# Patient Record
Sex: Female | Born: 1945 | Smoking: Never smoker
Health system: Southern US, Community
[De-identification: ages and names within clinical notes are randomized; demographics above are authoritative.]

## PROBLEM LIST (undated history)

## (undated) DIAGNOSIS — F329 Major depressive disorder, single episode, unspecified: Secondary | ICD-10-CM

## (undated) DIAGNOSIS — F419 Anxiety disorder, unspecified: Secondary | ICD-10-CM

## (undated) DIAGNOSIS — F32A Depression, unspecified: Secondary | ICD-10-CM

## (undated) DIAGNOSIS — I4891 Unspecified atrial fibrillation: Secondary | ICD-10-CM

## (undated) DIAGNOSIS — I10 Essential (primary) hypertension: Secondary | ICD-10-CM

## (undated) DIAGNOSIS — F0390 Unspecified dementia without behavioral disturbance: Secondary | ICD-10-CM

---

## 2015-06-06 ENCOUNTER — Emergency Department (HOSPITAL_COMMUNITY): Payer: Medicare Other

## 2015-06-06 ENCOUNTER — Emergency Department (HOSPITAL_COMMUNITY)
Admission: EM | Admit: 2015-06-06 | Discharge: 2015-06-06 | Disposition: A | Discharge status: Home / Self Care | Payer: Medicare Other | Attending: Emergency Medicine | Admitting: Emergency Medicine

## 2015-06-06 ENCOUNTER — Encounter (HOSPITAL_COMMUNITY): Payer: Self-pay | Admitting: Emergency Medicine

## 2015-06-06 DIAGNOSIS — Z880 Allergy status to penicillin: Secondary | ICD-10-CM | POA: Diagnosis not present

## 2015-06-06 DIAGNOSIS — F0390 Unspecified dementia without behavioral disturbance: Secondary | ICD-10-CM | POA: Insufficient documentation

## 2015-06-06 DIAGNOSIS — Z79899 Other long term (current) drug therapy: Secondary | ICD-10-CM | POA: Diagnosis not present

## 2015-06-06 DIAGNOSIS — F419 Anxiety disorder, unspecified: Secondary | ICD-10-CM | POA: Diagnosis not present

## 2015-06-06 DIAGNOSIS — R4182 Altered mental status, unspecified: Secondary | ICD-10-CM | POA: Diagnosis present

## 2015-06-06 DIAGNOSIS — Z7902 Long term (current) use of antithrombotics/antiplatelets: Secondary | ICD-10-CM | POA: Insufficient documentation

## 2015-06-06 HISTORY — DX: Anxiety disorder, unspecified: F41.9

## 2015-06-06 LAB — COMPREHENSIVE METABOLIC PANEL
ALBUMIN: 3 g/dL — AB (ref 3.5–5.0)
ALT: 18 U/L (ref 14–54)
ANION GAP: 5 (ref 5–15)
AST: 23 U/L (ref 15–41)
Alkaline Phosphatase: 64 U/L (ref 38–126)
BILIRUBIN TOTAL: 1.1 mg/dL (ref 0.3–1.2)
BUN: 10 mg/dL (ref 6–20)
CO2: 29 mmol/L (ref 22–32)
Calcium: 8.7 mg/dL — ABNORMAL LOW (ref 8.9–10.3)
Chloride: 109 mmol/L (ref 101–111)
Creatinine, Ser: 0.69 mg/dL (ref 0.44–1.00)
GFR calc Af Amer: >60 mL/min (ref >60)
Glucose, Bld: 87 mg/dL (ref 65–99)
POTASSIUM: 3.4 mmol/L — AB (ref 3.5–5.1)
Sodium: 143 mmol/L (ref 135–145)
TOTAL PROTEIN: 5.7 g/dL — AB (ref 6.5–8.1)

## 2015-06-06 LAB — URINALYSIS, ROUTINE W REFLEX MICROSCOPIC
BILIRUBIN URINE: NEGATIVE
Glucose, UA: NEGATIVE mg/dL
HGB URINE DIPSTICK: NEGATIVE
Ketones, ur: NEGATIVE mg/dL
Nitrite: NEGATIVE
PROTEIN: NEGATIVE mg/dL
Specific Gravity, Urine: 1.011 (ref 1.005–1.030)
UROBILINOGEN UA: 1 mg/dL (ref 0.0–1.0)
pH: 7 (ref 5.0–8.0)

## 2015-06-06 LAB — CBC WITH DIFFERENTIAL/PLATELET
BASOS PCT: 1 %
Basophils Absolute: 0 10*3/uL (ref 0.0–0.1)
Eosinophils Absolute: 0.2 10*3/uL (ref 0.0–0.7)
Eosinophils Relative: 3 %
HEMATOCRIT: 38.8 % (ref 36.0–46.0)
Hemoglobin: 12.8 g/dL (ref 12.0–15.0)
Lymphocytes Relative: 38 %
Lymphs Abs: 2.1 10*3/uL (ref 0.7–4.0)
MCH: 32.7 pg (ref 26.0–34.0)
MCHC: 33 g/dL (ref 30.0–36.0)
MCV: 99 fL (ref 78.0–100.0)
MONO ABS: 0.8 10*3/uL (ref 0.1–1.0)
MONOS PCT: 15 %
Neutro Abs: 2.4 10*3/uL (ref 1.7–7.7)
Neutrophils Relative %: 43 %
Platelets: 167 10*3/uL (ref 150–400)
RBC: 3.92 MIL/uL (ref 3.87–5.11)
RDW: 13.3 % (ref 11.5–15.5)
WBC: 5.5 10*3/uL (ref 4.0–10.5)

## 2015-06-06 LAB — PROTIME-INR
INR: 1.61 — ABNORMAL HIGH (ref 0.00–1.49)
PROTHROMBIN TIME: 19.1 s — AB (ref 11.6–15.2)

## 2015-06-06 LAB — URINE MICROSCOPIC-ADD ON

## 2015-06-06 LAB — I-STAT TROPONIN, ED: TROPONIN I, POC: 0.01 ng/mL (ref 0.00–0.08)

## 2015-06-06 NOTE — ED Notes (Signed)
PT BIB [facility]; Per EMS, pt coming to ED due to "not being happy today"; this is a reported status change from pt's usual pleasant demeanor; pt has dementia; pt calm and cooperative with NAD noted.

## 2015-06-06 NOTE — Discharge Instructions (Signed)

## 2015-06-06 NOTE — ED Notes (Signed)
Patient transported to CT 

## 2015-06-06 NOTE — ED Notes (Signed)
MD at bedside. 

## 2015-06-06 NOTE — ED Notes (Signed)
Pt returned from CT °

## 2015-06-08 NOTE — ED Provider Notes (Signed)
CSN: [id number]     Arrival date & time 06/06/15  1049 History   First MD Initiated Contact with Patient 06/06/15 1106     Chief Complaint  Patient presents with  . Altered Mental Status     (Consider location/radiation/quality/duration/timing/severity/associated sxs/prior Treatment) HPI Comments: 69 y.o. Female with history of dementia presents from her nursing facility for change in mental status.  Per report the patient was sent because she was uncooperative and unpleasant which is unusual for her so they were afraid that there was something wrong with her medically.  Patient was calm, happy, and cooperative at time of my assessment.  Not able to tell me why she is here but does answer questions.  Patient is a 69 y.o. female presenting with altered mental status.  Altered Mental Status   Past Medical History  Diagnosis Date  . Anxiety    History reviewed. No pertinent past surgical history. No family history on file. Social History  Substance Use Topics  . Smoking status: Unknown If Ever Smoked  . Smokeless tobacco: None  . Alcohol Use: None   OB History    No data available     Patient reports yes to any ROS questions asked but is oriented to person only and has documented dementia.  Review of Systems  Unable to perform ROS: Dementia      Allergies  Bacitracin; Other; Penicillins; and Statins  Home Medications   Prior to Admission medications   Medication Sig Start Date End Date Taking? Authorizing Provider  albuterol (VENTOLIN HFA) 108 (90 BASE) MCG/ACT inhaler Inhale 2 puffs into the lungs every 4 (four) hours as needed for wheezing.   Yes Historical Provider, MD  apixaban (ELIQUIS) 5 MG TABS tablet Take 5 mg by mouth 2 (two) times daily.   Yes Historical Provider, MD  divalproex (DEPAKOTE) 500 MG DR tablet Take 500 mg by mouth 2 (two) times daily.   Yes Historical Provider, MD  furosemide (LASIX) 40 MG tablet Take 20 mg by mouth daily.   Yes Historical  Provider, MD  LORazepam (ATIVAN) 0.5 MG tablet Take 0.5 mg by mouth at bedtime.   Yes Historical Provider, MD  LORazepam (ATIVAN) 0.5 MG tablet Take 0.5 mg by mouth daily as needed for anxiety.   Yes Historical Provider, MD  metoprolol succinate (TOPROL-XL) 25 MG 24 hr tablet Take 50 mg by mouth daily.   Yes Historical Provider, MD  nicotine polacrilex (NICORETTE) 2 MG gum Take 2 mg by mouth as needed for smoking cessation.   Yes Historical Provider, MD  quinapril (ACCUPRIL) 20 MG tablet Take 20 mg by mouth at bedtime.   Yes Historical Provider, MD  risperiDONE (RISPERDAL) 0.25 MG tablet Take 1 mg by mouth 2 (two) times daily.   Yes Historical Provider, MD  risperiDONE (RISPERDAL) 0.25 MG tablet Take 0.125 mg by mouth 2 (two) times daily as needed (For agitation.).   Yes Historical Provider, MD   BP 125/66 mmHg  Pulse 68  Temp(Src) 97.5 F (36.4 C) (Oral)  Resp 16  SpO2 100% Physical Exam  Constitutional: She appears well-developed and well-nourished. No distress.  HENT:  Head: Normocephalic and atraumatic.  Right Ear: External ear normal.  Left Ear: External ear normal.  Nose: Nose normal.  Mouth/Throat: Oropharynx is clear and moist. No oropharyngeal exudate.  Eyes: EOM are normal. Pupils are equal, round, and reactive to light.  Neck: Normal range of motion. Neck supple.  Cardiovascular: Normal rate, regular rhythm and intact distal pulses.  Pulmonary/Chest: Effort normal. No respiratory distress. She has no wheezes. She has no rales.  Abdominal: Soft. She exhibits no distension. There is no tenderness.  Musculoskeletal: Normal range of motion. She exhibits no edema or tenderness.  Neurological: She is alert. She has normal strength. No cranial nerve deficit. She exhibits normal muscle tone. Gait normal.  Oriented to person and knows she is at a hospital  Skin: Skin is warm and dry. No rash noted. She is not diaphoretic.  Psychiatric: She has a normal mood and affect.  Vitals  reviewed.   ED Course  Procedures (including critical care time) Labs Review Labs Reviewed  COMPREHENSIVE METABOLIC PANEL - Abnormal; Notable for the following:    Potassium 3.4 (*)    Calcium 8.7 (*)    Total Protein 5.7 (*)    Albumin 3.0 (*)    All other components within normal limits  URINALYSIS, ROUTINE W REFLEX MICROSCOPIC (NOT AT [facility]) - Abnormal; Notable for the following:    APPearance CLOUDY (*)    Leukocytes, UA TRACE (*)    All other components within normal limits  PROTIME-INR - Abnormal; Notable for the following:    Prothrombin Time 19.1 (*)    INR 1.61 (*)    All other components within normal limits  URINE MICROSCOPIC-ADD ON - Abnormal; Notable for the following:    Casts GRANULAR CAST (*)    All other components within normal limits  CBC WITH DIFFERENTIAL/PLATELET  Rosezena Sensor, ED    Imaging Review Dg Chest 2 View  06/06/2015  CLINICAL DATA:  Mental status changes.  Dementia up. EXAM: CHEST  2 VIEW COMPARISON:  None. FINDINGS: Heart size is normal. The aorta shows calcification and unfolding. There are abnormal interstitial markings in the lungs most consistent with chronic scarring. No evidence of consolidation or collapse. No effusions. Chronic degenerative changes affect the spine. IMPRESSION: No active disease suspected. Atherosclerosis of the aorta. Pulmonary fibrosis. Electronically Signed   By: Paulina Fusi M.D.   On: 06/06/2015 11:53   Ct Head Wo Contrast  06/06/2015  CLINICAL DATA:  Change in mental status.  History of dementia. EXAM: CT HEAD WITHOUT CONTRAST TECHNIQUE: Contiguous axial images were obtained from the base of the skull through the vertex without intravenous contrast. COMPARISON:  Prior study at [facility] on 02/22/2003 FINDINGS: Significant progression of generalized atrophy and moderately advanced small vessel disease in the periventricular white matter since the prior study. The brain demonstrates no evidence of hemorrhage,  infarction, edema, mass effect, extra-axial fluid collection, hydrocephalus or mass lesion. The skull is unremarkable. IMPRESSION: Significant progression of atrophy and small vessel disease since the prior study in 2004. No acute findings. Electronically Signed   By: Irish Lack M.D.   On: 06/06/2015 12:10   I have personally reviewed and evaluated these images and lab results as part of my medical decision-making.   EKG Interpretation   Date/Time:  Monday June 06 2015 11:47:59 EDT Ventricular Rate:  53 PR Interval:    QRS Duration: 84 QT Interval:  378 QTC Calculation: 354 R Axis:   43 Text Interpretation:  Atrial fibrillation with slow ventricular response  Abnormal ECG Reconfirmed by NGUYEN, EMILY ([id number]) on 06/08/2015 7:51:30 AM      MDM  Patient seen and evaluated in stable condition.  Normal vitals.  Unremarkable examination.  Patient in atrial fibrillation and documented to have history of such on Eliquis.  Labs and imaging unremarkable.  No sign of emergent medical condition, metabolic derangement,  traumatic injury.  Patient was discharged back to nursing facility in stable condition. Final diagnoses:  Dementia, without behavioral disturbance    1. Dementia    Leta Baptist, MD 06/08/15 [phone]

## 2015-06-10 ENCOUNTER — Emergency Department (HOSPITAL_COMMUNITY): Payer: Medicare Other

## 2015-06-10 ENCOUNTER — Encounter (HOSPITAL_COMMUNITY): Payer: Self-pay | Admitting: Emergency Medicine

## 2015-06-10 ENCOUNTER — Inpatient Hospital Stay (HOSPITAL_COMMUNITY)
Admission: EM | Admit: 2015-06-10 | Discharge: 2015-06-14 | DRG: 194 | Disposition: A | Discharge status: Nursing Home (Includes ICF) | Payer: Medicare Other | Attending: Internal Medicine | Admitting: Internal Medicine

## 2015-06-10 DIAGNOSIS — I4891 Unspecified atrial fibrillation: Secondary | ICD-10-CM | POA: Diagnosis present

## 2015-06-10 DIAGNOSIS — F0391 Unspecified dementia with behavioral disturbance: Secondary | ICD-10-CM | POA: Diagnosis present

## 2015-06-10 DIAGNOSIS — Z79899 Other long term (current) drug therapy: Secondary | ICD-10-CM

## 2015-06-10 DIAGNOSIS — R451 Restlessness and agitation: Secondary | ICD-10-CM | POA: Diagnosis present

## 2015-06-10 DIAGNOSIS — Z7901 Long term (current) use of anticoagulants: Secondary | ICD-10-CM

## 2015-06-10 DIAGNOSIS — Y95 Nosocomial condition: Secondary | ICD-10-CM | POA: Diagnosis present

## 2015-06-10 DIAGNOSIS — F03918 Unspecified dementia, unspecified severity, with other behavioral disturbance: Secondary | ICD-10-CM | POA: Diagnosis present

## 2015-06-10 DIAGNOSIS — I480 Paroxysmal atrial fibrillation: Secondary | ICD-10-CM | POA: Diagnosis not present

## 2015-06-10 DIAGNOSIS — J189 Pneumonia, unspecified organism: Secondary | ICD-10-CM | POA: Diagnosis not present

## 2015-06-10 DIAGNOSIS — F419 Anxiety disorder, unspecified: Secondary | ICD-10-CM | POA: Diagnosis present

## 2015-06-10 DIAGNOSIS — A419 Sepsis, unspecified organism: Secondary | ICD-10-CM

## 2015-06-10 DIAGNOSIS — I10 Essential (primary) hypertension: Secondary | ICD-10-CM | POA: Diagnosis present

## 2015-06-10 DIAGNOSIS — R4182 Altered mental status, unspecified: Secondary | ICD-10-CM | POA: Diagnosis present

## 2015-06-10 LAB — CBC WITH DIFFERENTIAL/PLATELET
Basophils Absolute: 0 10*3/uL (ref 0.0–0.1)
Basophils Relative: 0 %
Eosinophils Absolute: 0.1 10*3/uL (ref 0.0–0.7)
Eosinophils Relative: 1 %
HEMATOCRIT: 44.8 % (ref 36.0–46.0)
HEMOGLOBIN: 15.1 g/dL — AB (ref 12.0–15.0)
LYMPHS ABS: 1.8 10*3/uL (ref 0.7–4.0)
LYMPHS PCT: 16 %
MCH: 33.7 pg (ref 26.0–34.0)
MCHC: 33.7 g/dL (ref 30.0–36.0)
MCV: 100 fL (ref 78.0–100.0)
MONOS PCT: 14 %
Monocytes Absolute: 1.6 10*3/uL — ABNORMAL HIGH (ref 0.1–1.0)
NEUTROS ABS: 8 10*3/uL — AB (ref 1.7–7.7)
NEUTROS PCT: 69 %
Platelets: 173 10*3/uL (ref 150–400)
RBC: 4.48 MIL/uL (ref 3.87–5.11)
RDW: 13.8 % (ref 11.5–15.5)
WBC: 11.4 10*3/uL — AB (ref 4.0–10.5)

## 2015-06-10 LAB — URINALYSIS, ROUTINE W REFLEX MICROSCOPIC
GLUCOSE, UA: NEGATIVE mg/dL
HGB URINE DIPSTICK: NEGATIVE
Ketones, ur: NEGATIVE mg/dL
Nitrite: NEGATIVE
PROTEIN: NEGATIVE mg/dL
SPECIFIC GRAVITY, URINE: 1.026 (ref 1.005–1.030)
UROBILINOGEN UA: 2 mg/dL — AB (ref 0.0–1.0)
pH: 6 (ref 5.0–8.0)

## 2015-06-10 LAB — BASIC METABOLIC PANEL
Anion gap: 8 (ref 5–15)
BUN: 13 mg/dL (ref 6–20)
CHLORIDE: 105 mmol/L (ref 101–111)
CO2: 28 mmol/L (ref 22–32)
CREATININE: 0.73 mg/dL (ref 0.44–1.00)
Calcium: 9.3 mg/dL (ref 8.9–10.3)
GFR calc Af Amer: >60 mL/min (ref >60)
GFR calc non Af Amer: >60 mL/min (ref >60)
Glucose, Bld: 91 mg/dL (ref 65–99)
POTASSIUM: 3.6 mmol/L (ref 3.5–5.1)
Sodium: 141 mmol/L (ref 135–145)

## 2015-06-10 LAB — I-STAT CG4 LACTIC ACID, ED: Lactic Acid, Venous: 1.37 mmol/L (ref 0.5–2.0)

## 2015-06-10 LAB — URINE MICROSCOPIC-ADD ON

## 2015-06-10 MED ORDER — LORAZEPAM 0.5 MG PO TABS: 0.5000 mg | ORAL_TABLET | Freq: Every day | ORAL | Status: DC | PRN

## 2015-06-10 MED ORDER — APIXABAN 5 MG PO TABS
5.0000 mg | ORAL_TABLET | Freq: Two times a day (BID) | ORAL | Status: DC
  Administered 2015-06-10 – 2015-06-14 (×8): 5 mg via ORAL
  Filled 2015-06-10: qty 2
  Filled 2015-06-10 (×6): qty 1
  Filled 2015-06-10 (×2): qty 2

## 2015-06-10 MED ORDER — QUINAPRIL HCL 10 MG PO TABS
20.0000 mg | ORAL_TABLET | Freq: Every day | ORAL | Status: DC
  Filled 2015-06-10: qty 2

## 2015-06-10 MED ORDER — VANCOMYCIN HCL IN DEXTROSE 750-5 MG/150ML-% IV SOLN
750.0000 mg | Freq: Two times a day (BID) | INTRAVENOUS | Status: DC
  Administered 2015-06-11 – 2015-06-12 (×2): 750 mg via INTRAVENOUS
  Filled 2015-06-10 (×3): qty 150

## 2015-06-10 MED ORDER — POTASSIUM CHLORIDE IN NACL 20-0.9 MEQ/L-% IV SOLN
INTRAVENOUS | Status: DC
  Administered 2015-06-10: via INTRAVENOUS
  Filled 2015-06-10 (×2): qty 1000

## 2015-06-10 MED ORDER — ACETAMINOPHEN 325 MG PO TABS: 650.0000 mg | ORAL_TABLET | Freq: Four times a day (QID) | ORAL | Status: DC | PRN

## 2015-06-10 MED ORDER — DEXTROSE 5 % IV SOLN
2.0000 g | Freq: Once | INTRAVENOUS | Status: AC
  Administered 2015-06-11: 2 g via INTRAVENOUS
  Filled 2015-06-10: qty 2

## 2015-06-10 MED ORDER — IPRATROPIUM BROMIDE 0.02 % IN SOLN: 0.5000 mg | Freq: Four times a day (QID) | RESPIRATORY_TRACT | Status: DC

## 2015-06-10 MED ORDER — DIVALPROEX SODIUM 500 MG PO DR TAB
500.0000 mg | DELAYED_RELEASE_TABLET | Freq: Two times a day (BID) | ORAL | Status: DC
  Administered 2015-06-10: 500 mg via ORAL
  Filled 2015-06-10 (×3): qty 1

## 2015-06-10 MED ORDER — METOPROLOL SUCCINATE ER 50 MG PO TB24
50.0000 mg | ORAL_TABLET | Freq: Every day | ORAL | Status: DC
  Administered 2015-06-11: 50 mg via ORAL
  Filled 2015-06-10: qty 1

## 2015-06-10 MED ORDER — FUROSEMIDE 20 MG PO TABS
20.0000 mg | ORAL_TABLET | Freq: Every day | ORAL | Status: DC
  Administered 2015-06-11 – 2015-06-14 (×4): 20 mg via ORAL
  Filled 2015-06-10 (×4): qty 1

## 2015-06-10 MED ORDER — RISPERIDONE 1 MG PO TABS
1.0000 mg | ORAL_TABLET | Freq: Two times a day (BID) | ORAL | Status: DC
  Administered 2015-06-10 – 2015-06-14 (×8): 1 mg via ORAL
  Filled 2015-06-10 (×9): qty 1

## 2015-06-10 MED ORDER — CITALOPRAM HYDROBROMIDE 20 MG PO TABS
10.0000 mg | ORAL_TABLET | Freq: Every day | ORAL | Status: DC
  Administered 2015-06-11 – 2015-06-14 (×4): 10 mg via ORAL
  Filled 2015-06-10 (×4): qty 1

## 2015-06-10 MED ORDER — RISPERIDONE 0.25 MG PO TABS
0.1250 mg | ORAL_TABLET | Freq: Two times a day (BID) | ORAL | Status: DC | PRN
  Filled 2015-06-10: qty 0.5

## 2015-06-10 MED ORDER — CEFTAZIDIME 1 G IJ SOLR
1.0000 g | Freq: Three times a day (TID) | INTRAMUSCULAR | Status: DC
  Administered 2015-06-11 – 2015-06-12 (×3): 1 g via INTRAVENOUS
  Filled 2015-06-10 (×5): qty 1

## 2015-06-10 MED ORDER — NICOTINE POLACRILEX 2 MG MT GUM
2.0000 mg | CHEWING_GUM | OROMUCOSAL | Status: DC | PRN
  Filled 2015-06-10: qty 1

## 2015-06-10 MED ORDER — DIGOXIN 125 MCG PO TABS
0.2500 mg | ORAL_TABLET | Freq: Every day | ORAL | Status: DC
  Administered 2015-06-11 – 2015-06-14 (×4): 0.25 mg via ORAL
  Filled 2015-06-10 (×4): qty 2

## 2015-06-10 MED ORDER — GUAIFENESIN ER 600 MG PO TB12
600.0000 mg | ORAL_TABLET | Freq: Two times a day (BID) | ORAL | Status: DC
  Administered 2015-06-10: 600 mg via ORAL
  Filled 2015-06-10 (×2): qty 1

## 2015-06-10 MED ORDER — LORAZEPAM 0.5 MG PO TABS
0.5000 mg | ORAL_TABLET | Freq: Every day | ORAL | Status: DC
  Administered 2015-06-10 – 2015-06-13 (×4): 0.5 mg via ORAL
  Filled 2015-06-10 (×4): qty 1

## 2015-06-10 MED ORDER — DEXTROSE 5 % IV SOLN
2.0000 g | Freq: Three times a day (TID) | INTRAVENOUS | Status: DC
Start: 2015-06-10 — End: 2015-06-10

## 2015-06-10 MED ORDER — SODIUM CHLORIDE 0.9 % IV BOLUS (SEPSIS)
1000.0000 mL | Freq: Once | INTRAVENOUS | Status: AC
Start: 2015-06-10 — End: 2015-06-10
  Administered 2015-06-10: 1000 mL via INTRAVENOUS

## 2015-06-10 MED ORDER — ALBUTEROL SULFATE (2.5 MG/3ML) 0.083% IN NEBU: 2.5000 mg | INHALATION_SOLUTION | Freq: Four times a day (QID) | RESPIRATORY_TRACT | Status: DC

## 2015-06-10 MED ORDER — VANCOMYCIN HCL IN DEXTROSE 1-5 GM/200ML-% IV SOLN
1000.0000 mg | Freq: Once | INTRAVENOUS | Status: AC
  Administered 2015-06-10: 1000 mg via INTRAVENOUS
  Filled 2015-06-10: qty 200

## 2015-06-10 NOTE — ED Notes (Addendum)
Per EMS [facility]; per staff pt "has increased emotional agitation; going back and forth; screaming and crying" onset this morning. Staff report pt usually calm; hx of dementia (alert to self ONLY).   Pt goes by Sheryl Horn.

## 2015-06-10 NOTE — Progress Notes (Signed)
pcp [facility] [place] [facility] [address] [place], [place] [phone]

## 2015-06-10 NOTE — ED Notes (Signed)
Pt attempted to use restroom, was unsuccessful 

## 2015-06-10 NOTE — Progress Notes (Signed)
ANTIBIOTIC CONSULT NOTE - INITIAL  Pharmacy Consult for Vancomycin, Ceftazidime Indication: pneumonia  Allergies  Allergen Reactions  . Bacitracin Other (See Comments)    Reaction unknown per MAR  . Other Other (See Comments)    HMG-COA reductase inhibitors - reaction unknown per MAR  . Penicillins Other (See Comments)    Reaction unknown per MAR  . Statins Other (See Comments)    [facility]    Patient Measurements: Weight: 133 lb 2.5 oz (60.4 kg)  Vital Signs: Temp: 98.1 F (36.7 C) (11/04 2124) Temp Source: Oral (11/04 2124) BP: 141/59 mmHg (11/04 2124) Pulse Rate: 80 (11/04 2124) Intake/Output from previous day:   Intake/Output from this shift:    Labs:  Recent Labs  06/10/15 1618  WBC 11.4*  HGB 15.1*  PLT 173  CREATININE 0.73   CrCl cannot be calculated (Unknown ideal weight.). No results for input(s): VANCOTROUGH, VANCOPEAK, VANCORANDOM, GENTTROUGH, GENTPEAK, GENTRANDOM, TOBRATROUGH, TOBRAPEAK, TOBRARND, AMIKACINPEAK, AMIKACINTROU, AMIKACIN in the last 72 hours.   Microbiology: No results found for this or any previous visit (from the past 720 hour(s)).  Medical History: Past Medical History  Diagnosis Date  . Anxiety     Assessment: [age] yoF admitted from NH with agitation, found to have pneumonia on CXR.  Ceftazidime and vancomycin ordered.  Patient has penicillin allergy (unknown reaction) listed on NH MAR.  Spoke with admitting MD and will continue with Ceftazidime.  RN alerted of penicillin allergy and will monitor first dose.  - Afebrile  - WBC 11.4 - CrCl~61 ml/min (using SCr 0.8)  Goal of Therapy:  Vancomycin trough level 15-20 mcg/ml Doses adjusted per renal function Eradication of infection  Plan:  1.  Ceftazidime 2g IV x 1 then 1g q8h. 2.  Vancomycin 1g x 1 then 750 mg IV q12h. 3.  F/u culture results, SCr, trough levels as indicated.  Clance Bollunyon, Birch Farino 06/10/2015,9:27 PM

## 2015-06-10 NOTE — ED Notes (Signed)
Bed: [id number] Expected date:  Expected time:  Means of arrival:  Comments: Ems-69 agitation

## 2015-06-10 NOTE — H&P (Signed)
[facility] History and Physical  Leinani Lisbon ZOX:[id number] DOB: 1946/01/01 DOA: 06/10/2015  Referring physician: Lyndal Pulley, MD PCP: PROVIDER NOT IN SYSTEM   Chief Complaint: Altered mental status.  HPI: Mikaili Flippin is a 69 y.o. female with a past medical history of anxiety, dementia, hypertension, atrial fibrillation on Apixaban who is brought to the emergency department via EMS from her nursing home facility due to a change in mental status. Per records, apparently the patient was "not being happy today" which was reported as a change from her usual pleasant demeanor. She is unable to provide further history and answers most questions with yes. When seen in the emergency department, patient was in no acute distress. No further history is available at this time.   Review of Systems:  Unable to review due to the patient's mental status.  Past Medical History  Diagnosis Date  . Anxiety    History reviewed. No pertinent past surgical history. Social History:  has no tobacco, alcohol, and drug history on file.  Allergies  Allergen Reactions  . Bacitracin Other (See Comments)    Reaction unknown per MAR  . Other Other (See Comments)    HMG-COA reductase inhibitors - reaction unknown per MAR  . Penicillins Other (See Comments)    Reaction unknown per MAR  . Statins Other (See Comments)    Reaction unknown per [facility]    No family history on file.   Prior to Admission medications   Medication Sig Start Date End Date Taking? Authorizing Provider  acetaminophen (TYLENOL) 325 MG tablet Take 650 mg by mouth every 6 (six) hours as needed for moderate pain.   Yes Historical Provider, MD  albuterol (VENTOLIN HFA) 108 (90 BASE) MCG/ACT inhaler Inhale 2 puffs into the lungs every 4 (four) hours as needed for wheezing.   Yes Historical Provider, MD  apixaban (ELIQUIS) 5 MG TABS tablet Take 5 mg by mouth 2 (two) times daily.   Yes Historical Provider, MD  citalopram (CELEXA) 10 MG  tablet Take 10 mg by mouth daily.   Yes Historical Provider, MD  digoxin (LANOXIN) 0.25 MG tablet Take 0.25 mg by mouth daily.   Yes Historical Provider, MD  divalproex (DEPAKOTE) 500 MG DR tablet Take 500 mg by mouth 2 (two) times daily.   Yes Historical Provider, MD  Fluticasone-Salmeterol (ADVAIR) 500-50 MCG/DOSE AEPB Inhale 1 puff into the lungs 2 (two) times daily.   Yes Historical Provider, MD  furosemide (LASIX) 40 MG tablet Take 20 mg by mouth daily.   Yes Historical Provider, MD  LORazepam (ATIVAN) 0.5 MG tablet Take 0.5 mg by mouth at bedtime.   Yes Historical Provider, MD  LORazepam (ATIVAN) 0.5 MG tablet Take 0.5 mg by mouth daily as needed for anxiety.   Yes Historical Provider, MD  metoprolol succinate (TOPROL-XL) 25 MG 24 hr tablet Take 50 mg by mouth daily.   Yes Historical Provider, MD  nicotine polacrilex (NICORETTE) 2 MG gum Take 2 mg by mouth as needed for smoking cessation.   Yes Historical Provider, MD  quinapril (ACCUPRIL) 20 MG tablet Take 20 mg by mouth at bedtime.   Yes Historical Provider, MD  risperiDONE (RISPERDAL) 0.25 MG tablet Take 1 mg by mouth 2 (two) times daily.   Yes Historical Provider, MD  risperiDONE (RISPERDAL) 0.25 MG tablet Take 0.125 mg by mouth 2 (two) times daily as needed (For agitation.).   Yes Historical Provider, MD   Physical Exam: Filed Vitals:   06/10/15 1455 06/10/15 1643  06/10/15 1929 06/10/15 2124  BP: 99/39 110/41 137/68 141/59  Pulse: 101 90 83 80  Temp:   98.5 F (36.9 C) 98.1 F (36.7 C)  TempSrc:   Oral Oral  Resp: Weight:    60.4 kg (133 lb 2.5 oz)  SpO2: 94% 98% 96% 97%    Wt Readings from Last 3 Encounters:  06/10/15 60.4 kg (133 lb 2.5 oz)    General:  Appears calm and comfortable Eyes: PERRL, normal lids, irises & conjunctiva ENT: grossly normal hearing, lips & tongue are mildly dry Neck: no LAD, masses or thyromegaly Cardiovascular: RRR, no m/r/g. No LE edema. Telemetry: SR, no arrhythmias    Respiratory: Left lower lobe rales. Normal respiratory effort. Abdomen: soft, ntnd Skin: Multiple ecchymosis, particularly on extremities. Musculoskeletal: grossly normal tone BUE/BLE Psychiatric: Disoriented to time and geographical Place. She knows that she is in the hospital. Neurologic: Moves all extremities.           Labs on Admission:  Basic Metabolic Panel:  Recent Labs Lab 06/06/15 1215 06/10/15 1618  NA 143 141  K 3.4* 3.6  CL 109 105  CO2 29 28  GLUCOSE 87 91  BUN 10 13  CREATININE 0.69 0.73  CALCIUM 8.7* 9.3   Liver Function Tests:  Recent Labs Lab 06/06/15 1215  AST 23  ALT 18  ALKPHOS 64  BILITOT 1.1  PROT 5.7*  ALBUMIN 3.0*   CBC:  Recent Labs Lab 06/06/15 1215 06/10/15 1618  WBC 5.5 11.4*  NEUTROABS 2.4 8.0*  HGB 12.8 15.1*  HCT 38.8 44.8  MCV 99.0 100.0  PLT 167 173    Radiological Exams on Admission: Dg Chest 2 View  06/10/2015  CLINICAL DATA:  Per EMS pt from [facility] for [facility]; per staff pt "has increased emotional agitation; going back and forth; screaming and crying" onset this morning. Staff report pt usually calm; hx of dementia (alert to self ONLY). Elevated WBC EXAM: CHEST  2 VIEW COMPARISON:  06/06/2015 FINDINGS: Compare is airspace consolidation in the left lower lobe posteriorly and medially, new from the prior exam. Remainder the lungs show prominent bronchovascular markings but are otherwise clear. No pleural effusion. No pneumothorax. Cardiac silhouette is mildly enlarged. No mediastinal or hilar masses or evidence of adenopathy. Bony thorax is demineralized but grossly intact. IMPRESSION: Left lower lobe pneumonia. Electronically Signed   By: Amie Portland M.D.   On: 06/10/2015 18:15   Ct Head Wo Contrast  06/10/2015  CLINICAL DATA:  Altered mental status.  History of dementia. EXAM: CT HEAD WITHOUT CONTRAST TECHNIQUE: Contiguous axial images were obtained from the base of the skull through the vertex  without intravenous contrast. COMPARISON:  05/27/2015. FINDINGS: Diffusely enlarged ventricles and subarachnoid spaces. Patchy white matter low density in both cerebral hemispheres. No intracranial hemorrhage, mass lesion or CT evidence of acute infarction. Small right sphenoid sinus retention cyst. IMPRESSION: 1. No acute abnormality. 2. Stable atrophy and chronic small vessel white matter ischemic changes. Electronically Signed   By: Beckie Salts M.D.   On: 06/10/2015 17:54      Assessment/Plan Principal Problem:   HCAP (healthcare-associated pneumonia) Continue supplemental oxygen. Continue HCAP antibiotic coverage therapy. Continue bronchodilators. Check Legionella and strep pneumoniae urine antigens. Check sputum Gram stain, culture and sensitivity Follow-up blood cultures and sensitivity.  Active Problems:   Atrial fibrillation (HCC) Continue anticoagulation with Eliquis and beta blocker for rate control.    HTN (hypertension) Continue metoprolol and quinapril. Monitor  blood pressure closely.    Dementia Supportive care. Reorientation as needed. Continue risperidone as usual.    Anxiety    Code Status: Full code. DVT Prophylaxis: Lovenox SQ. Family Communication:  Disposition Plan: Admit for healthcare associated pneumonia treatment.  Time spent: Over 70 minutes were spent during the process of this admission.  Bobette Moavid Manuel Ortiz [facility] Pager [phone].

## 2015-06-11 ENCOUNTER — Encounter (HOSPITAL_COMMUNITY): Payer: Self-pay | Admitting: *Deleted

## 2015-06-11 DIAGNOSIS — I480 Paroxysmal atrial fibrillation: Secondary | ICD-10-CM

## 2015-06-11 DIAGNOSIS — F0391 Unspecified dementia with behavioral disturbance: Secondary | ICD-10-CM

## 2015-06-11 LAB — CBC WITH DIFFERENTIAL/PLATELET
BASOS ABS: 0 10*3/uL (ref 0.0–0.1)
Basophils Relative: 0 %
EOS ABS: 0.2 10*3/uL (ref 0.0–0.7)
EOS PCT: 2 %
HCT: 36.7 % (ref 36.0–46.0)
Hemoglobin: 12.2 g/dL (ref 12.0–15.0)
Lymphocytes Relative: 18 %
Lymphs Abs: 1.7 10*3/uL (ref 0.7–4.0)
MCH: 32.9 pg (ref 26.0–34.0)
MCHC: 33.2 g/dL (ref 30.0–36.0)
MCV: 98.9 fL (ref 78.0–100.0)
Monocytes Absolute: 1.2 10*3/uL — ABNORMAL HIGH (ref 0.1–1.0)
Monocytes Relative: 13 %
Neutro Abs: 6.1 10*3/uL (ref 1.7–7.7)
Neutrophils Relative %: 67 %
PLATELETS: 154 10*3/uL (ref 150–400)
RBC: 3.71 MIL/uL — AB (ref 3.87–5.11)
RDW: 13.7 % (ref 11.5–15.5)
WBC: 9.1 10*3/uL (ref 4.0–10.5)

## 2015-06-11 LAB — COMPREHENSIVE METABOLIC PANEL
ALK PHOS: 60 U/L (ref 38–126)
ALT: 16 U/L (ref 14–54)
AST: 16 U/L (ref 15–41)
Albumin: 2.5 g/dL — ABNORMAL LOW (ref 3.5–5.0)
Anion gap: 4 — ABNORMAL LOW (ref 5–15)
BILIRUBIN TOTAL: 1.2 mg/dL (ref 0.3–1.2)
BUN: 9 mg/dL (ref 6–20)
CALCIUM: 8.2 mg/dL — AB (ref 8.9–10.3)
CO2: 25 mmol/L (ref 22–32)
CREATININE: 0.57 mg/dL (ref 0.44–1.00)
Chloride: 111 mmol/L (ref 101–111)
GFR calc Af Amer: >60 mL/min (ref >60)
Glucose, Bld: 94 mg/dL (ref 65–99)
Potassium: 3.2 mmol/L — ABNORMAL LOW (ref 3.5–5.1)
Sodium: 140 mmol/L (ref 135–145)
TOTAL PROTEIN: 5.1 g/dL — AB (ref 6.5–8.1)

## 2015-06-11 LAB — PROTIME-INR
INR: 1.93 — AB (ref 0.00–1.49)
Prothrombin Time: 21.9 seconds — ABNORMAL HIGH (ref 11.6–15.2)

## 2015-06-11 LAB — STREP PNEUMONIAE URINARY ANTIGEN: Strep Pneumo Urinary Antigen: NEGATIVE

## 2015-06-11 LAB — APTT: APTT: 62 s — AB (ref 24–37)

## 2015-06-11 MED ORDER — ALBUTEROL SULFATE (2.5 MG/3ML) 0.083% IN NEBU: 2.5000 mg | INHALATION_SOLUTION | RESPIRATORY_TRACT | Status: DC | PRN

## 2015-06-11 MED ORDER — HALOPERIDOL LACTATE 5 MG/ML IJ SOLN: 2.0000 mg | Freq: Four times a day (QID) | INTRAMUSCULAR | Status: DC | PRN

## 2015-06-11 MED ORDER — METOPROLOL TARTRATE 25 MG PO TABS
25.0000 mg | ORAL_TABLET | Freq: Two times a day (BID) | ORAL | Status: DC
  Administered 2015-06-12 – 2015-06-14 (×6): 25 mg via ORAL
  Filled 2015-06-11 (×6): qty 1

## 2015-06-11 MED ORDER — GUAIFENESIN 100 MG/5ML PO SOLN
10.0000 mL | Freq: Four times a day (QID) | ORAL | Status: DC
  Administered 2015-06-11 – 2015-06-14 (×9): 200 mg via ORAL
  Filled 2015-06-11 (×10): qty 10

## 2015-06-11 MED ORDER — DIVALPROEX SODIUM 125 MG PO CSDR
500.0000 mg | DELAYED_RELEASE_CAPSULE | Freq: Two times a day (BID) | ORAL | Status: DC
  Administered 2015-06-12 – 2015-06-14 (×6): 500 mg via ORAL
  Filled 2015-06-11 (×7): qty 4

## 2015-06-11 MED ORDER — LISINOPRIL 20 MG PO TABS
20.0000 mg | ORAL_TABLET | Freq: Every day | ORAL | Status: DC
  Administered 2015-06-12 – 2015-06-13 (×3): 20 mg via ORAL
  Filled 2015-06-11 (×3): qty 1

## 2015-06-11 NOTE — Progress Notes (Signed)
[facility] PROGRESS NOTE  Sheryl Horn WGN:[id number] DOB: 06/15/1946 DOA: 06/10/2015 PCP: PROVIDER NOT IN SYSTEM  Assessment/Plan: HCAP (healthcare-associated pneumonia) -not very symptomatic, Continue HCAP antibiotic coverage today, change to Po Abx tomorrow -Continue bronchodilators.  Legionella and strep pneumoniae urine antigens-negative -SLP evaluation to rule out aspiration   Atrial fibrillation (HCC) -Continue anticoagulation with Eliquis and beta blocker for rate control. -Continued digoxin   HTN (hypertension) -Continue metoprolol and quinapril. -Monitor blood pressure closely.   Dementia with agitation -appropriate at this time, continue risperidone, reorientation  -Continue Depakote for mood stabilization   Anxiety  Code Status: Full code. DVT Prophylaxis: Lovenox SQ  Code Status: Full Family Communication:none at bedside Disposition Plan:  SNF Tomorrow if stable     HPI/Subjective: Feels ok, still weak  Objective: Filed Vitals:   06/11/15 1300  BP: 120/57  Pulse: 106  Temp: 97.4 F (36.3 C)  Resp: 18    Intake/Output Summary (Last 24 hours) at 06/11/15 1415 Last data filed at 06/11/15 0854  Gross per 24 hour  Intake    480 ml  Output      0 ml  Net    480 ml   Filed Weights   06/10/15 2124  Weight: 60.4 kg (133 lb 2.5 oz)    Exam:   General:  pleasantly confused, in no distress, oriented to self only   Cardiovascular: S1S2/RRR  Respiratory: CTAB  Abdomen: soft, Nt, BS present  Musculoskeletal: no edema c/c  Data Reviewed: Basic Metabolic Panel:  Recent Labs Lab 06/06/15 1215 06/10/15 1618 06/11/15 0358  NA 143 141 140  K 3.4* 3.6 3.2*  CL 109 105 111  CO2 29 28 25   GLUCOSE 87 91 94  BUN 10 13 9   CREATININE 0.69 0.73 0.57  CALCIUM 8.7* 9.3 8.2*   Liver Function Tests:  Recent Labs Lab 06/06/15 1215 06/11/15 0358  AST 23 16  ALT 18 16  ALKPHOS 64 60  BILITOT 1.1 1.2  PROT 5.7* 5.1*  ALBUMIN 3.0*  2.5*   No results for input(s): LIPASE, AMYLASE in the last 168 hours. No results for input(s): AMMONIA in the last 168 hours. CBC:  Recent Labs Lab 06/06/15 1215 06/10/15 1618 06/11/15 0358  WBC 5.5 11.4* 9.1  NEUTROABS 2.4 8.0* 6.1  HGB 12.8 15.1* 12.2  HCT 38.8 44.8 36.7  MCV 99.0 100.0 98.9  PLT 167 173 154   Cardiac Enzymes: No results for input(s): CKTOTAL, CKMB, CKMBINDEX, TROPONINI in the last 168 hours. BNP (last 3 results) No results for input(s): BNP in the last 8760 hours.  ProBNP (last 3 results) No results for input(s): PROBNP in the last 8760 hours.  CBG: No results for input(s): GLUCAP in the last 168 hours.  Recent Results (from the past 240 hour(s))  Blood culture (routine x 2)     Status: None (Preliminary result)   Collection Time: 06/10/15  7:00 PM  Result Value Ref Range Status   Specimen Description BLOOD RIGHT ANTECUBITAL  Final   Special Requests BOTTLES DRAWN AEROBIC AND ANAEROBIC 10CC EACH  Final   Culture   Final    NO GROWTH < 12 HOURS [facility]    Report Status PENDING  Incomplete  Blood culture (routine x 2)     Status: None (Preliminary result)   Collection Time: 06/10/15  7:20 PM  Result Value Ref Range Status   Specimen Description BLOOD LEFT ANTECUBITAL  Final   Special Requests BOTTLES DRAWN AEROBIC AND ANAEROBIC 5CC  EACH  Final   Culture   Final    NO GROWTH < 12 HOURS Performed at [facility]    Report Status PENDING  Incomplete     Studies: Dg Chest 2 View  06/10/2015  CLINICAL DATA:  Per EMS pt from [facility] for [facility]; per staff pt "has increased emotional agitation; going back and forth; screaming and crying" onset this morning. Staff report pt usually calm; hx of dementia (alert to self ONLY). Elevated WBC EXAM: CHEST  2 VIEW COMPARISON:  06/06/2015 FINDINGS: Compare is airspace consolidation in the left lower lobe posteriorly and medially, new from the prior exam.  Remainder the lungs show prominent bronchovascular markings but are otherwise clear. No pleural effusion. No pneumothorax. Cardiac silhouette is mildly enlarged. No mediastinal or hilar masses or evidence of adenopathy. Bony thorax is demineralized but grossly intact. IMPRESSION: Left lower lobe pneumonia. Electronically Signed   By: Amie Portland M.D.   On: 06/10/2015 18:15   Ct Head Wo Contrast  06/10/2015  CLINICAL DATA:  Altered mental status.  History of dementia. EXAM: CT HEAD WITHOUT CONTRAST TECHNIQUE: Contiguous axial images were obtained from the base of the skull through the vertex without intravenous contrast. COMPARISON:  05/27/2015. FINDINGS: Diffusely enlarged ventricles and subarachnoid spaces. Patchy white matter low density in both cerebral hemispheres. No intracranial hemorrhage, mass lesion or CT evidence of acute infarction. Small right sphenoid sinus retention cyst. IMPRESSION: 1. No acute abnormality. 2. Stable atrophy and chronic small vessel white matter ischemic changes. Electronically Signed   By: Beckie Salts M.D.   On: 06/10/2015 17:54    Scheduled Meds: . apixaban  5 mg Oral BID  . cefTAZidime (FORTAZ)  IV  1 g Intravenous 3 times per day  . citalopram  10 mg Oral Daily  . digoxin  0.25 mg Oral Daily  . divalproex  500 mg Oral Q12H  . furosemide  20 mg Oral Daily  . guaiFENesin  10 mL Oral QID  . lisinopril  20 mg Oral QHS  . LORazepam  0.5 mg Oral QHS  . metoprolol tartrate  25 mg Oral BID  . risperiDONE  1 mg Oral BID  . vancomycin  750 mg Intravenous Q12H   Continuous Infusions:  Antibiotics Given (last 72 hours)    Date/Time Action Medication Dose Rate   06/10/15 2353 Given  [1845 dose not given in ED. Pt moved to 3W. Rescheduled for 3W RN to give now.]   vancomycin (VANCOCIN) IVPB 1000 mg/200 mL premix 1,000 mg 200 mL/hr   06/11/15 0021 Given   cefTAZidime (FORTAZ) 2 g in dextrose 5 % 50 mL IVPB 2 g 100 mL/hr   06/11/15 0851 Given   cefTAZidime (FORTAZ)  1 g in dextrose 5 % 50 mL IVPB 1 g 100 mL/hr   06/11/15 1053 Given   vancomycin (VANCOCIN) IVPB 750 mg/150 ml premix 750 mg 150 mL/hr      Principal Problem:   HCAP (healthcare-associated pneumonia) Active Problems:   Atrial fibrillation (HCC)   HTN (hypertension)   Dementia   Anxiety    Time spent:    [facility]  [facility] Pager [phone]. If 7PM-7AM, please contact night-coverage at [email or web address], password [facility] 06/11/2015, 2:15 PM  LOS: 1 day

## 2015-06-11 NOTE — Evaluation (Signed)
Clinical/Bedside Swallow Evaluation Patient Details  Name: Sheryl Horn MRN: [id number] Date of Birth: 08/19/1945  Today's Date: 06/11/2015 Time: SLP Start Time (ACUTE ONLY): 1600 SLP Stop Time (ACUTE ONLY): 1611 SLP Time Calculation (min) (ACUTE ONLY): 11 min  Past Medical History:  Past Medical History  Diagnosis Date  . Anxiety    Past Surgical History: History reviewed. No pertinent past surgical history. HPI:  Sheryl Horn is a 69 y.o. female with a past medical history of anxiety, dementia, hypertension, atrial fibrillation on Apixaban who is brought to the emergency department via EMS from her nursing home facility due to a change in mental status. CT of head negative for acute abnormalities. Chest x ray signficant for left lower lobe PNA.    Assessment / Plan / Recommendation Clinical Impression  Pt presents with mild oral dysphagia secondary to edentulous upper oral cavity limiting effective mastication and bolus cohesion of soid PO. Pts dysphagia is impacted by poor mentation and advanced dementia. Pt pleasantly confused. No signs or symptoms of reduced airway protection with any PO. Swallow initiation appearing timely. Recommend dypshagia 3 (mechanical soft) and thin liquids. Medicines crushed with puree due to RN reported pt difficulty with larger pills with some oral holding and mastication of whole pills. ST to follow up x1 for diet tolerance.    Aspiration Risk  Mild    Diet Recommendation Dysphagia 3 (Mech soft);Thin   Medication Administration: Crushed with puree Compensations: Small sips/bites;Minimize environmental distractions    Other  Recommendations Oral Care Recommendations: Oral care BID   Follow Up Recommendations       Frequency and Duration min 1 x/week  1 week   Pertinent Vitals/Pain     SLP Swallow Goals     Swallow Study Prior Functional Status       General Date of Onset: 06/10/15 Other Pertinent Information: Sheryl Horn is a 69 y.o. female  with a past medical history of anxiety, dementia, hypertension, atrial fibrillation on Apixaban who is brought to the emergency department via EMS from her nursing home facility due to a change in mental status. CT of head negative for acute abnormalities. Chest x ray signficant for left lower lobe PNA.  Type of Study: Bedside swallow evaluation Diet Prior to this Study: Regular;Thin liquids Temperature Spikes Noted: No Respiratory Status: Room air History of Recent Intubation: No Behavior/Cognition: Alert;Confused;Requires cueing Oral Cavity - Dentition: Edentulous;Other (Comment) (edentulous upper oral cavity, natural lower dentition) Self-Feeding Abilities: Needs assist Patient Positioning: Upright in bed Baseline Vocal Quality: Normal Volitional Cough: Weak Volitional Swallow: Able to elicit    Oral/Motor/Sensory Function Overall Oral Motor/Sensory Function: Appears within functional limits for tasks assessed   Ice Chips Ice chips: Within functional limits   Thin Liquid Thin Liquid: Within functional limits Presentation: Cup;Straw    Nectar Thick Nectar Thick Liquid: Not tested   Honey Thick Honey Thick Liquid: Not tested   Puree Puree: Within functional limits   Solid   GO    Solid: Impaired Presentation: Self Fed Oral Phase Impairments: Impaired mastication Oral Phase Functional Implications: Oral residue      Marcene Duoshelsea Sumney MA, CCC-SLP Acute Care Speech Language Pathologist    Marcene DuosSumney, Torrian Canion E 06/11/2015,4:14 PM

## 2015-06-11 NOTE — ED Provider Notes (Signed)
CSN: [id number]     Arrival date & time 06/10/15  1238 History   First MD Initiated Contact with Patient 06/10/15 1503     Chief Complaint  Patient presents with  . Medical Clearance     (Consider location/radiation/quality/duration/timing/severity/associated sxs/prior Treatment) Patient is a 69 y.o. female presenting with altered mental status. The history is provided by the EMS personnel.  Altered Mental Status Presenting symptoms: behavior changes and combativeness   Severity:  Moderate Most recent episode:  Today Episode history:  Continuous Timing:  Constant Progression:  Resolved Chronicity:  New Context: dementia     Past Medical History  Diagnosis Date  . Anxiety    History reviewed. No pertinent past surgical history. History reviewed. No pertinent family history. Social History  Substance Use Topics  . Smoking status: Unknown If Ever Smoked  . Smokeless tobacco: None  . Alcohol Use: None   OB History    No data available     Review of Systems  Unable to perform ROS: Dementia      Allergies  Bacitracin; Other; Penicillins; and Statins  Home Medications   Prior to Admission medications   Medication Sig Start Date End Date Taking? Authorizing Provider  acetaminophen (TYLENOL) 325 MG tablet Take 650 mg by mouth every 6 (six) hours as needed for moderate pain.   Yes Historical Provider, MD  albuterol (VENTOLIN HFA) 108 (90 BASE) MCG/ACT inhaler Inhale 2 puffs into the lungs every 4 (four) hours as needed for wheezing.   Yes Historical Provider, MD  apixaban (ELIQUIS) 5 MG TABS tablet Take 5 mg by mouth 2 (two) times daily.   Yes Historical Provider, MD  citalopram (CELEXA) 10 MG tablet Take 10 mg by mouth daily.   Yes Historical Provider, MD  digoxin (LANOXIN) 0.25 MG tablet Take 0.25 mg by mouth daily.   Yes Historical Provider, MD  divalproex (DEPAKOTE) 500 MG DR tablet Take 500 mg by mouth 2 (two) times daily.   Yes Historical Provider, MD   Fluticasone-Salmeterol (ADVAIR) 500-50 MCG/DOSE AEPB Inhale 1 puff into the lungs 2 (two) times daily.   Yes Historical Provider, MD  furosemide (LASIX) 40 MG tablet Take 20 mg by mouth daily.   Yes Historical Provider, MD  LORazepam (ATIVAN) 0.5 MG tablet Take 0.5 mg by mouth at bedtime.   Yes Historical Provider, MD  LORazepam (ATIVAN) 0.5 MG tablet Take 0.5 mg by mouth daily as needed for anxiety.   Yes Historical Provider, MD  metoprolol succinate (TOPROL-XL) 25 MG 24 hr tablet Take 50 mg by mouth daily.   Yes Historical Provider, MD  nicotine polacrilex (NICORETTE) 2 MG gum Take 2 mg by mouth as needed for smoking cessation.   Yes Historical Provider, MD  quinapril (ACCUPRIL) 20 MG tablet Take 20 mg by mouth at bedtime.   Yes Historical Provider, MD  risperiDONE (RISPERDAL) 0.25 MG tablet Take 1 mg by mouth 2 (two) times daily.   Yes Historical Provider, MD  risperiDONE (RISPERDAL) 0.25 MG tablet Take 0.125 mg by mouth 2 (two) times daily as needed (For agitation.).   Yes Historical Provider, MD   BP 120/57 mmHg  Pulse 106  Temp(Src) 97.4 F (36.3 C) (Oral)  Resp 18  Ht  (1.575 m)  Wt 133 lb 2.5 oz (60.4 kg)  BMI 24.35 kg/m2  SpO2 97% Physical Exam  Constitutional: She appears well-developed. She appears listless. She is easily aroused. No distress.  HENT:  Head: Normocephalic and atraumatic.  Eyes: Conjunctivae are  normal.  Neck: Neck supple. No tracheal deviation present.  Cardiovascular: Normal rate and regular rhythm.   Pulmonary/Chest: Effort normal. No respiratory distress.  Abdominal: Soft. She exhibits no distension.  Neurological: She is easily aroused. She appears listless. She is disoriented. GCS eye subscore is 4. GCS verbal subscore is 3. GCS motor subscore is 6.  Skin: Skin is warm and dry.  Psychiatric: Her affect is blunt. She is slowed and withdrawn.    ED Course  Procedures (including critical care time) Labs Review Labs Reviewed  CBC WITH  DIFFERENTIAL/PLATELET - Abnormal; Notable for the following:    WBC 11.4 (*)    Hemoglobin 15.1 (*)    Neutro Abs 8.0 (*)    Monocytes Absolute 1.6 (*)    All other components within normal limits  URINALYSIS, ROUTINE W REFLEX MICROSCOPIC (NOT AT [facility]) - Abnormal; Notable for the following:    Color, Urine AMBER (*)    Bilirubin Urine SMALL (*)    Urobilinogen, UA 2.0 (*)    Leukocytes, UA SMALL (*)    All other components within normal limits  COMPREHENSIVE METABOLIC PANEL - Abnormal; Notable for the following:    Potassium 3.2 (*)    Calcium 8.2 (*)    Total Protein 5.1 (*)    Albumin 2.5 (*)    Anion gap 4 (*)    All other components within normal limits  CBC WITH DIFFERENTIAL/PLATELET - Abnormal; Notable for the following:    RBC 3.71 (*)    Monocytes Absolute 1.2 (*)    All other components within normal limits  APTT - Abnormal; Notable for the following:    aPTT 62 (*)    All other components within normal limits  PROTIME-INR - Abnormal; Notable for the following:    Prothrombin Time 21.9 (*)    INR 1.93 (*)    All other components within normal limits  CULTURE, BLOOD (ROUTINE X 2)  CULTURE, BLOOD (ROUTINE X 2)  CULTURE, EXPECTORATED SPUTUM-ASSESSMENT  GRAM STAIN  BASIC METABOLIC PANEL  URINE MICROSCOPIC-ADD ON  STREP PNEUMONIAE URINARY ANTIGEN  LEGIONELLA PNEUMOPHILA SEROGP 1 UR AG  I-STAT CG4 LACTIC ACID, ED    Imaging Review Dg Chest 2 View  06/10/2015  CLINICAL DATA:  Per EMS pt from [facility]; per staff pt "has increased emotional agitation; going back and forth; screaming and crying" onset this morning. Staff report pt usually calm; hx of dementia (alert to self ONLY). Elevated WBC EXAM: CHEST  2 VIEW COMPARISON:  06/06/2015 FINDINGS: Compare is airspace consolidation in the left lower lobe posteriorly and medially, new from the prior exam. Remainder the lungs show prominent bronchovascular markings but are otherwise clear. No  pleural effusion. No pneumothorax. Cardiac silhouette is mildly enlarged. No mediastinal or hilar masses or evidence of adenopathy. Bony thorax is demineralized but grossly intact. IMPRESSION: Left lower lobe pneumonia. Electronically Signed   By: Amie Portlandavid  Ormond M.D.   On: 06/10/2015 18:15   Ct Head Wo Contrast  06/10/2015  CLINICAL DATA:  Altered mental status.  History of dementia. EXAM: CT HEAD WITHOUT CONTRAST TECHNIQUE: Contiguous axial images were obtained from the base of the skull through the vertex without intravenous contrast. COMPARISON:  05/27/2015. FINDINGS: Diffusely enlarged ventricles and subarachnoid spaces. Patchy white matter low density in both cerebral hemispheres. No intracranial hemorrhage, mass lesion or CT evidence of acute infarction. Small right sphenoid sinus retention cyst. IMPRESSION: 1. No acute abnormality. 2. Stable atrophy and chronic small vessel white matter  ischemic changes. Electronically Signed   By: Beckie Salts M.D.   On: 06/10/2015 17:54   I have personally reviewed and evaluated these images and lab results as part of my medical decision-making.   EKG Interpretation None      MDM   Final diagnoses:  HCAP (healthcare-associated pneumonia)  Sepsis, due to unspecified organism [facility])    69 y.o. female presents with increased agitation which is off of her baseline from nursing facility. Pt unable to contribute to exam d/t persistent dementia. Appears sleepy but not agitated. Will screen for causes of delirium. Pt with elevation of WBC count, tachycardia, and LLL pneumonia concerningfor developing sepsis. Covered for HCAP d/t facility status. Hospitalist was consulted for admission and will see the patient in the emergency department.     Lyndal Pulley, MD 06/11/15 [phone]

## 2015-06-11 NOTE — Progress Notes (Signed)
Utilization Review Completed.Maysa Lynn T11/12/2014  

## 2015-06-12 LAB — CBC WITH DIFFERENTIAL/PLATELET
BASOS PCT: 0 %
Basophils Absolute: 0 10*3/uL (ref 0.0–0.1)
EOS ABS: 0.2 10*3/uL (ref 0.0–0.7)
Eosinophils Relative: 2 %
HEMATOCRIT: 38.1 % (ref 36.0–46.0)
Hemoglobin: 12.8 g/dL (ref 12.0–15.0)
Lymphocytes Relative: 18 %
Lymphs Abs: 1.4 10*3/uL (ref 0.7–4.0)
MCH: 33.2 pg (ref 26.0–34.0)
MCHC: 33.6 g/dL (ref 30.0–36.0)
MCV: 99 fL (ref 78.0–100.0)
MONO ABS: 1.2 10*3/uL — AB (ref 0.1–1.0)
MONOS PCT: 15 %
NEUTROS ABS: 5.1 10*3/uL (ref 1.7–7.7)
Neutrophils Relative %: 65 %
Platelets: 175 10*3/uL (ref 150–400)
RBC: 3.85 MIL/uL — ABNORMAL LOW (ref 3.87–5.11)
RDW: 13.6 % (ref 11.5–15.5)
WBC: 7.9 10*3/uL (ref 4.0–10.5)

## 2015-06-12 LAB — COMPREHENSIVE METABOLIC PANEL
ALT: 17 U/L (ref 14–54)
AST: 18 U/L (ref 15–41)
Albumin: 2.4 g/dL — ABNORMAL LOW (ref 3.5–5.0)
Alkaline Phosphatase: 63 U/L (ref 38–126)
Anion gap: 7 (ref 5–15)
BUN: 6 mg/dL (ref 6–20)
CALCIUM: 8.5 mg/dL — AB (ref 8.9–10.3)
CO2: 25 mmol/L (ref 22–32)
CREATININE: 0.52 mg/dL (ref 0.44–1.00)
Chloride: 109 mmol/L (ref 101–111)
GFR calc non Af Amer: >60 mL/min (ref >60)
GLUCOSE: 100 mg/dL — AB (ref 65–99)
Potassium: 3.4 mmol/L — ABNORMAL LOW (ref 3.5–5.1)
SODIUM: 141 mmol/L (ref 135–145)
Total Bilirubin: 1.1 mg/dL (ref 0.3–1.2)
Total Protein: 5.4 g/dL — ABNORMAL LOW (ref 6.5–8.1)

## 2015-06-12 MED ORDER — LEVOFLOXACIN 500 MG PO TABS
500.0000 mg | ORAL_TABLET | Freq: Every day | ORAL | Status: DC
  Administered 2015-06-12 – 2015-06-14 (×3): 500 mg via ORAL
  Filled 2015-06-12 (×3): qty 1

## 2015-06-12 NOTE — Progress Notes (Signed)
[facility] PROGRESS NOTE  Sheryl Horn WJX:[id number] DOB: Nov 17, 1945 DOA: 06/10/2015 PCP: PROVIDER NOT IN SYSTEM  Assessment/Plan: HCAP (healthcare-associated pneumonia) -not very symptomatic,  -Day 2 of HCAP antibiotic coverage today, change to Po levaquin today -Continue bronchodilators.  Legionella and strep pneumoniae urine antigens-negative -SLP eval completed, D3 diet with thin liquids recommended   Atrial fibrillation (HCC) -Continue anticoagulation with Eliquis and beta blocker for rate control. -Continued digoxin   HTN (hypertension) -Continue metoprolol and quinapril. -Monitor blood pressure closely.   Dementia with agitation -very agitated overnight, Haldol PRN -continue risperidone, reorientation  -Continue Depakote for mood stabilization   Anxiety -Continue QHS Ativan per home regimen  Code Status: Full code. DVT Prophylaxis: Lovenox SQ  Code Status: Full Family Communication:none at bedside Disposition Plan:  SNF Tomorrow if stable     HPI/Subjective: Feels ok, still weak, very agitated last night  Objective: Filed Vitals:   06/12/15 0546  BP: 161/72  Pulse: 95  Temp:   Resp: 18    Intake/Output Summary (Last 24 hours) at 06/12/15 1141 Last data filed at 06/12/15 0950  Gross per 24 hour  Intake    480 ml  Output    550 ml  Net    -70 ml   Filed Weights   06/10/15 2124  Weight: 60.4 kg (133 lb 2.5 oz)    Exam:   General:  pleasantly confused, in no distress, oriented to self only , sleepy  Cardiovascular: S1S2/RRR  Respiratory: CTAB  Abdomen: soft, Nt, BS present  Musculoskeletal: no edema c/c  Data Reviewed: Basic Metabolic Panel:  Recent Labs Lab 06/06/15 1215 06/10/15 1618 06/11/15 0358 06/12/15 0357  NA 143 141 140 141  K 3.4* 3.6 3.2* 3.4*  CL 109 105 111 109  CO2 GLUCOSE 87 91 94 100*  BUN CREATININE 0.69 0.73 0.57 0.52  CALCIUM 8.7* 9.3 8.2* 8.5*   Liver Function  Tests:  Recent Labs Lab 06/06/15 1215 06/11/15 0358 06/12/15 0357  AST ALT ALKPHOS 64 60 63  BILITOT 1.1 1.2 1.1  PROT 5.7* 5.1* 5.4*  ALBUMIN 3.0* 2.5* 2.4*   No results for input(s): LIPASE, AMYLASE in the last 168 hours. No results for input(s): AMMONIA in the last 168 hours. CBC:  Recent Labs Lab 06/06/15 1215 06/10/15 1618 06/11/15 0358 06/12/15 0357  WBC 5.5 11.4* 9.1 7.9  NEUTROABS 2.4 8.0* 6.1 5.1  HGB 12.8 15.1* 12.2 12.8  HCT 38.8 44.8 36.7 38.1  MCV 99.0 100.0 98.9 99.0  PLT 167 173 154 175   Cardiac Enzymes: No results for input(s): CKTOTAL, CKMB, CKMBINDEX, TROPONINI in the last 168 hours. BNP (last 3 results) No results for input(s): BNP in the last 8760 hours.  ProBNP (last 3 results) No results for input(s): PROBNP in the last 8760 hours.  CBG: No results for input(s): GLUCAP in the last 168 hours.  Recent Results (from the past 240 hour(s))  Blood culture (routine x 2)     Status: None (Preliminary result)   Collection Time: 06/10/15  7:00 PM  Result Value Ref Range Status   Specimen Description BLOOD RIGHT ANTECUBITAL  Final   Special Requests BOTTLES DRAWN AEROBIC AND ANAEROBIC 10CC EACH  Final   Culture   Final    NO GROWTH 2 DAYS Performed at [facility]    Report Status PENDING  Incomplete  Blood culture (routine x 2)  Status: None (Preliminary result)   Collection Time: 06/10/15  7:20 PM  Result Value Ref Range Status   Specimen Description BLOOD LEFT ANTECUBITAL  Final   Special Requests BOTTLES DRAWN AEROBIC AND ANAEROBIC 5CC EACH  Final   Culture   Final    NO GROWTH 2 DAYS Performed at [facility]    Report Status PENDING  Incomplete     Studies: Dg Chest 2 View  06/10/2015  CLINICAL DATA:  Per EMS pt from [facility] for psychiatric evaluation; per staff pt "has increased emotional agitation; going back and forth; screaming and crying" onset this morning. Staff report pt usually  calm; hx of dementia (alert to self ONLY). Elevated WBC EXAM: CHEST  2 VIEW COMPARISON:  06/06/2015 FINDINGS: Compare is airspace consolidation in the left lower lobe posteriorly and medially, new from the prior exam. Remainder the lungs show prominent bronchovascular markings but are otherwise clear. No pleural effusion. No pneumothorax. Cardiac silhouette is mildly enlarged. No mediastinal or hilar masses or evidence of adenopathy. Bony thorax is demineralized but grossly intact. IMPRESSION: Left lower lobe pneumonia. Electronically Signed   By: Amie Portland M.D.   On: 06/10/2015 18:15   Ct Head Wo Contrast  06/10/2015  CLINICAL DATA:  Altered mental status.  History of dementia. EXAM: CT HEAD WITHOUT CONTRAST TECHNIQUE: Contiguous axial images were obtained from the base of the skull through the vertex without intravenous contrast. COMPARISON:  05/27/2015. FINDINGS: Diffusely enlarged ventricles and subarachnoid spaces. Patchy white matter low density in both cerebral hemispheres. No intracranial hemorrhage, mass lesion or CT evidence of acute infarction. Small right sphenoid sinus retention cyst. IMPRESSION: 1. No acute abnormality. 2. Stable atrophy and chronic small vessel white matter ischemic changes. Electronically Signed   By: Beckie Salts M.D.   On: 06/10/2015 17:54    Scheduled Meds: . apixaban  5 mg Oral BID  . citalopram  10 mg Oral Daily  . digoxin  0.25 mg Oral Daily  . divalproex  500 mg Oral Q12H  . furosemide  20 mg Oral Daily  . guaiFENesin  10 mL Oral QID  . levofloxacin  500 mg Oral Daily  . lisinopril  20 mg Oral QHS  . LORazepam  0.5 mg Oral QHS  . metoprolol tartrate  25 mg Oral BID  . risperiDONE  1 mg Oral BID   Continuous Infusions:  Antibiotics Given (last 72 hours)    Date/Time Action Medication Dose Rate   06/10/15 2353 Given  [1845 dose not given in ED. Pt moved to 3W. Rescheduled for 3W RN to give now.]   vancomycin (VANCOCIN) IVPB 1000 mg/200 mL premix 1,000  mg 200 mL/hr   06/11/15 0021 Given   cefTAZidime (FORTAZ) 2 g in dextrose 5 % 50 mL IVPB 2 g 100 mL/hr   06/11/15 0851 Given   cefTAZidime (FORTAZ) 1 g in dextrose 5 % 50 mL IVPB 1 g 100 mL/hr   06/11/15 1053 Given   vancomycin (VANCOCIN) IVPB 750 mg/150 ml premix 750 mg 150 mL/hr   06/11/15 1641 Given   cefTAZidime (FORTAZ) 1 g in dextrose 5 % 50 mL IVPB 1 g 100 mL/hr   06/12/15 0028 Given   vancomycin (VANCOCIN) IVPB 750 mg/150 ml premix 750 mg 150 mL/hr   06/12/15 0130 Given  [scanned late system downtime]   cefTAZidime (FORTAZ) 1 g in dextrose 5 % 50 mL IVPB 1 g 100 mL/hr      Principal Problem:   HCAP (healthcare-associated  pneumonia) Active Problems:   Atrial fibrillation (HCC)   HTN (hypertension)   Dementia   Anxiety    Time spent: 35min    [facility] Surgery CenterJOSEPH,Shaquelle Hernon  [facility] Pager [phone]. If 7PM-7AM, please contact night-coverage at [email or web address], password [email or web address] 06/12/2015, 11:41 AM  LOS: 2 days

## 2015-06-12 NOTE — Progress Notes (Signed)
Patient actions of attempting to get out of bed, pulling lines, striking at staff, and yelling have stopped.  Patient has been assigned a safety sitter at this time and responding positively to these alternate measures.  Patient soft waist restraint discontinued at this time do to patient calm/ resting demeanor with sitter presence

## 2015-06-13 LAB — LEGIONELLA PNEUMOPHILA SEROGP 1 UR AG: L. PNEUMOPHILA SEROGP 1 UR AG: NEGATIVE

## 2015-06-13 LAB — CBC WITH DIFFERENTIAL/PLATELET
Basophils Absolute: 0 10*3/uL (ref 0.0–0.1)
Basophils Relative: 1 %
EOS ABS: 0.2 10*3/uL (ref 0.0–0.7)
Eosinophils Relative: 3 %
HCT: 37.3 % (ref 36.0–46.0)
HEMOGLOBIN: 12.5 g/dL (ref 12.0–15.0)
LYMPHS ABS: 1.7 10*3/uL (ref 0.7–4.0)
LYMPHS PCT: 21 %
MCH: 33.2 pg (ref 26.0–34.0)
MCHC: 33.5 g/dL (ref 30.0–36.0)
MCV: 98.9 fL (ref 78.0–100.0)
Monocytes Absolute: 1.4 10*3/uL — ABNORMAL HIGH (ref 0.1–1.0)
Monocytes Relative: 17 %
NEUTROS PCT: 58 %
Neutro Abs: 4.7 10*3/uL (ref 1.7–7.7)
Platelets: 201 10*3/uL (ref 150–400)
RBC: 3.77 MIL/uL — AB (ref 3.87–5.11)
RDW: 13.5 % (ref 11.5–15.5)
WBC: 8.1 10*3/uL (ref 4.0–10.5)

## 2015-06-13 LAB — COMPREHENSIVE METABOLIC PANEL
ALT: 16 U/L (ref 14–54)
AST: 15 U/L (ref 15–41)
Albumin: 2.3 g/dL — ABNORMAL LOW (ref 3.5–5.0)
Alkaline Phosphatase: 62 U/L (ref 38–126)
Anion gap: 7 (ref 5–15)
BUN: 7 mg/dL (ref 6–20)
CHLORIDE: 109 mmol/L (ref 101–111)
CO2: 24 mmol/L (ref 22–32)
Calcium: 8.4 mg/dL — ABNORMAL LOW (ref 8.9–10.3)
Creatinine, Ser: 0.54 mg/dL (ref 0.44–1.00)
Glucose, Bld: 97 mg/dL (ref 65–99)
POTASSIUM: 3.5 mmol/L (ref 3.5–5.1)
SODIUM: 140 mmol/L (ref 135–145)
Total Bilirubin: 1 mg/dL (ref 0.3–1.2)
Total Protein: 5 g/dL — ABNORMAL LOW (ref 6.5–8.1)

## 2015-06-13 NOTE — NC FL2 (Deleted)
[place] MEDICAID FL2 LEVEL OF CARE SCREENING TOOL     IDENTIFICATION  Patient Name: Sheryl Horn Birthdate: 1946/07/01 Sex: female Admission Date (Current Location): 06/10/2015  [place] and [place] Number: Haynes Bast [id number] M Facility and Address:  [facility],  [address], [place] [address]      Provider Number: [id number]  Attending Physician Name and Address:  Zannie Cove, MD  Relative Name and Phone Number:       Current Level of Care: Hospital Recommended Level of Care: Assisted Living Facility, Memory Care Prior Approval Number:    Date Approved/Denied:   PASRR Number: [id number]  Discharge Plan: Other (Comment) (Assisted Living Memory Care Unit)    Current Diagnoses: Patient Active Problem List   Diagnosis Date Noted  . HCAP (healthcare-associated pneumonia) 06/10/2015  . Atrial fibrillation (HCC) 06/10/2015  . HTN (hypertension) 06/10/2015  . Dementia 06/10/2015  . Anxiety 06/10/2015    Orientation ACTIVITIES/SOCIAL BLADDER RESPIRATION    Self    Incontinent Normal  BEHAVIORAL SYMPTOMS/MOOD NEUROLOGICAL BOWEL NUTRITION STATUS      Continent Diet (mechanical soft, thin liquids)  PHYSICIAN VISITS COMMUNICATION OF NEEDS Height & Weight Skin    Verbally  (157.5 cm) 133 lbs. Normal          AMBULATORY STATUS RESPIRATION    Supervision limited (+1 assist) Normal      Personal Care Assistance Level of Assistance  Bathing, Dressing, Feeding Bathing Assistance: Limited assistance Feeding assistance: Limited assistance (supervision with PO intake) Dressing Assistance: Limited assistance      Functional Limitations Info  Sight, Hearing, Speech Sight Info: Adequate Hearing Info: Adequate Speech Info: Adequate       SPECIAL CARE FACTORS FREQUENCY                      Additional Factors Info  Code Status, Allergies Code Status Info: FULL code status. Allergies Info: Bacitracin, Other, Penicillins, Statins            Current Medications (06/13/2015): Current Facility-Administered Medications  Medication Dose Route Frequency Provider Last Rate Last Dose  . acetaminophen (TYLENOL) tablet 650 mg  650 mg Oral Q6H PRN Bobette Mo, MD      . albuterol (PROVENTIL) (2.5 MG/3ML) 0.083% nebulizer solution 2.5 mg  2.5 mg Nebulization Q4H PRN Bobette Mo, MD      . apixaban Everlene Balls) tablet 5 mg  5 mg Oral BID Bobette Mo, MD   5 mg at 06/12/15 2146  . citalopram (CELEXA) tablet 10 mg  10 mg Oral Daily Bobette Mo, MD   10 mg at 06/12/15 1201  . digoxin (LANOXIN) tablet 0.25 mg  0.25 mg Oral Daily Bobette Mo, MD   0.25 mg at 06/12/15 1159  . divalproex (DEPAKOTE SPRINKLE) capsule 500 mg  500 mg Oral Q12H Zannie Cove, MD   500 mg at 06/12/15 2146  . furosemide (LASIX) tablet 20 mg  20 mg Oral Daily Bobette Mo, MD   20 mg at 06/12/15 1200  . guaiFENesin (ROBITUSSIN) 100 MG/5ML solution 200 mg  10 mL Oral QID Zannie Cove, MD   200 mg at 06/12/15 1651  . haloperidol lactate (HALDOL) injection 2 mg  2 mg Intravenous Q6H PRN Gerome Apley [place], PA-C      . levofloxacin (LEVAQUIN) tablet 500 mg  500 mg Oral Daily Zannie Cove, MD   500 mg at 06/12/15 1201  . lisinopril (PRINIVIL,ZESTRIL) tablet 20 mg  20 mg Oral QHS  Bobette Moavid Manuel Ortiz, MD   20 mg at 06/12/15 2146  . LORazepam (ATIVAN) tablet 0.5 mg  0.5 mg Oral QHS Bobette Moavid Manuel Ortiz, MD   0.5 mg at 06/12/15 2146  . LORazepam (ATIVAN) tablet 0.5 mg  0.5 mg Oral Daily PRN Bobette Moavid Manuel Ortiz, MD      . metoprolol tartrate (LOPRESSOR) tablet 25 mg  25 mg Oral BID Zannie CovePreetha Joseph, MD   25 mg at 06/12/15 2146  . nicotine polacrilex (NICORETTE) gum 2 mg  2 mg Oral PRN Bobette Moavid Manuel Ortiz, MD      . risperiDONE (RISPERDAL) tablet 0.125 mg  0.125 mg Oral BID PRN Bobette Moavid Manuel Ortiz, MD      . risperiDONE (RISPERDAL) tablet 1 mg  1 mg Oral BID Bobette Moavid Manuel Ortiz, MD   1 mg at 06/12/15 2146   Do not use this list as official  medication orders. Please verify with discharge summary.  Discharge Medications:   Medication List    ASK your doctor about these medications        acetaminophen 325 MG tablet  Commonly known as:  TYLENOL  Take 650 mg by mouth every 6 (six) hours as needed for moderate pain.     citalopram 10 MG tablet  Commonly known as:  CELEXA  Take 10 mg by mouth daily.     digoxin 0.25 MG tablet  Commonly known as:  LANOXIN  Take 0.25 mg by mouth daily.     divalproex 500 MG DR tablet  Commonly known as:  DEPAKOTE  Take 500 mg by mouth 2 (two) times daily.     ELIQUIS 5 MG Tabs tablet  Generic drug:  apixaban  Take 5 mg by mouth 2 (two) times daily.     Fluticasone-Salmeterol 500-50 MCG/DOSE Aepb  Commonly known as:  ADVAIR  Inhale 1 puff into the lungs 2 (two) times daily.     furosemide 40 MG tablet  Commonly known as:  LASIX  Take 20 mg by mouth daily.     LORazepam 0.5 MG tablet  Commonly known as:  ATIVAN  Take 0.5 mg by mouth at bedtime.     LORazepam 0.5 MG tablet  Commonly known as:  ATIVAN  Take 0.5 mg by mouth daily as needed for anxiety.     metoprolol succinate 25 MG 24 hr tablet  Commonly known as:  TOPROL-XL  Take 50 mg by mouth daily.     nicotine polacrilex 2 MG gum  Commonly known as:  NICORETTE  Take 2 mg by mouth as needed for smoking cessation.     quinapril 20 MG tablet  Commonly known as:  ACCUPRIL  Take 20 mg by mouth at bedtime.     risperiDONE 0.25 MG tablet  Commonly known as:  RISPERDAL  Take 1 mg by mouth 2 (two) times daily.     risperiDONE 0.25 MG tablet  Commonly known as:  RISPERDAL  Take 0.125 mg by mouth 2 (two) times daily as needed (For agitation.).     VENTOLIN HFA 108 (90 BASE) MCG/ACT inhaler  Generic drug:  albuterol  Inhale 2 puffs into the lungs every 4 (four) hours as needed for wheezing.        Relevant Imaging Results:  Relevant Lab Results:  Recent Labs    Additional Information SSN: [id number]  KIDD,  SUZANNA A, LCSW

## 2015-06-13 NOTE — Progress Notes (Signed)
Speech Language Pathology Treatment: Dysphagia  Patient Details Name: Sheryl Horn MRN: [id number] DOB: [birth year] Today's Date: 06/13/2015 Time: 7614-7092 SLP Time Calculation (min) (ACUTE ONLY): 15 min  Assessment / Plan / Recommendation Clinical Impression  Pt observed taking liquid medication given by RN.  Significant oral holding noted, but once pt swallowed there were no indications of airway compromise or difficulties- ? Suspect gustatory component to oral holding.    Pt subsequently given tea by SLP with timely swallow elicited with great tolerance.  Pt denies difficulties swallowing *though has h/o dementia.  She does not have upper dentition - pt reports they were lost - *lower dentures in place*.  Pt denies issues with  Heartburn/reflux.    Recommend continue dys3 diet long term due to pt's dentition and dementia.    Encouraged pt to continue to self feed for maximal airway protection.  Pt did express concern re: "this congestion" but reports being hopeful it will "get better".    No SLP follow up indicated as pt appears to be managing her po diet well - with intake at 10-50%.    HPI Other Pertinent Information: Nyliah Nierenberg is a 69 y.o. female with a past medical history of anxiety, dementia, hypertension, atrial fibrillation on Apixaban who is brought to the emergency department via EMS from her nursing home facility due to a change in mental status. CT of head negative for acute abnormalities. Chest x ray signficant for left lower lobe PNA.    Pertinent Vitals Pain Assessment: No/denies pain  SLP Plan  All goals met    Recommendations Diet recommendations: Thin liquid;Dysphagia 3 (mechanical soft) Liquids provided via: Cup;Straw Medication Administration: Whole meds with liquid Supervision: Patient able to self feed Compensations: Small sips/bites;Slow rate;Check for pocketing Postural Changes and/or Swallow Maneuvers: Upright 30-60 min after meal;Seated upright 90 degrees               Follow up Recommendations: None Plan: All goals met    [place], [facility] SLP [phone]

## 2015-06-13 NOTE — Progress Notes (Signed)
[facility] PROGRESS NOTE  Sheryl Horn ZHY:[id number] DOB: 08/03/1946 DOA: 06/10/2015 PCP: PROVIDER NOT IN SYSTEM  Assessment/Plan: HCAP (healthcare-associated pneumonia) -not very symptomatic,  -s/p 2days of IV Abx,  changed to Po levaquin 11/6 -cultures negative  Legionella and strep pneumoniae urine antigens-negative -SLP eval completed, D3 diet with thin liquids recommended   Atrial fibrillation (HCC) -Continue anticoagulation with Eliquis and beta blocker for rate control. -Continued digoxin   HTN (hypertension) -Continue metoprolol and quinapril. -Monitor blood pressure closely.   Dementia with agitation -very agitated overnight 11/4, given IV Haldol  -continue risperidone, reorientation  -Continue Depakote for mood stabilization -improved mood, DC sitter   Anxiety -Continue QHS Ativan per home regimen  Code Status: Full code. DVT Prophylaxis: Lovenox SQ  Code Status: Full Family Communication:none at bedside Disposition Plan:  ALF tomorrow    HPI/Subjective: No complaints, no issues with agitation overnight  Objective: Filed Vitals:   06/13/15 0553  BP: 126/60  Pulse: 94  Temp: 98.1 F (36.7 C)  Resp: 18    Intake/Output Summary (Last 24 hours) at 06/13/15 1041 Last data filed at 06/13/15 0745  Gross per 24 hour  Intake    240 ml  Output    700 ml  Net   -460 ml   Filed Weights   06/10/15 2124  Weight: 60.4 kg (133 lb 2.5 oz)    Exam:   General:  pleasantly confused, in no distress, oriented to self only , sleepy  Cardiovascular: S1S2/RRR  Respiratory: CTAB  Abdomen: soft, Nt, BS present  Musculoskeletal: no edema c/c  Data Reviewed: Basic Metabolic Panel:  Recent Labs Lab 06/06/15 1215 06/10/15 1618 06/11/15 0358 06/12/15 0357 06/13/15 0355  NA 143 141 140 141 140  K 3.4* 3.6 3.2* 3.4* 3.5  CL 109 105 111 109 109  CO2 29 28 25 25 24   GLUCOSE 87 91 94 100* 97  BUN 10 13 9 6 7   CREATININE 0.69 0.73 0.57 0.52  0.54  CALCIUM 8.7* 9.3 8.2* 8.5* 8.4*   Liver Function Tests:  Recent Labs Lab 06/06/15 1215 06/11/15 0358 06/12/15 0357 06/13/15 0355  AST 23 16 18 15   ALT 18 16 17 16   ALKPHOS 64 60 63 62  BILITOT 1.1 1.2 1.1 1.0  PROT 5.7* 5.1* 5.4* 5.0*  ALBUMIN 3.0* 2.5* 2.4* 2.3*   No results for input(s): LIPASE, AMYLASE in the last 168 hours. No results for input(s): AMMONIA in the last 168 hours. CBC:  Recent Labs Lab 06/06/15 1215 06/10/15 1618 06/11/15 0358 06/12/15 0357 06/13/15 0355  WBC 5.5 11.4* 9.1 7.9 8.1  NEUTROABS 2.4 8.0* 6.1 5.1 4.7  HGB 12.8 15.1* 12.2 12.8 12.5  HCT 38.8 44.8 36.7 38.1 37.3  MCV 99.0 100.0 98.9 99.0 98.9  PLT 167 173 154 175 201   Cardiac Enzymes: No results for input(s): CKTOTAL, CKMB, CKMBINDEX, TROPONINI in the last 168 hours. BNP (last 3 results) No results for input(s): BNP in the last 8760 hours.  ProBNP (last 3 results) No results for input(s): PROBNP in the last 8760 hours.  CBG: No results for input(s): GLUCAP in the last 168 hours.  Recent Results (from the past 240 hour(s))  Blood culture (routine x 2)     Status: None (Preliminary result)   Collection Time: 06/10/15  7:00 PM  Result Value Ref Range Status   Specimen Description BLOOD RIGHT ANTECUBITAL  Final   Special Requests BOTTLES DRAWN AEROBIC AND ANAEROBIC 10CC EACH  Final   Culture  Final    NO GROWTH 2 DAYS Performed at [facility]    Report Status PENDING  Incomplete  Blood culture (routine x 2)     Status: None (Preliminary result)   Collection Time: 06/10/15  7:20 PM  Result Value Ref Range Status   Specimen Description BLOOD LEFT ANTECUBITAL  Final   Special Requests BOTTLES DRAWN AEROBIC AND ANAEROBIC 5CC EACH  Final   Culture   Final    NO GROWTH 2 DAYS Performed at [facility]    Report Status PENDING  Incomplete     Studies: No results found.  Scheduled Meds: . apixaban  5 mg Oral BID  . citalopram  10 mg Oral Daily  .  digoxin  0.25 mg Oral Daily  . divalproex  500 mg Oral Q12H  . furosemide  20 mg Oral Daily  . guaiFENesin  10 mL Oral QID  . levofloxacin  500 mg Oral Daily  . lisinopril  20 mg Oral QHS  . LORazepam  0.5 mg Oral QHS  . metoprolol tartrate  25 mg Oral BID  . risperiDONE  1 mg Oral BID   Continuous Infusions:  Antibiotics Given (last 72 hours)    Date/Time Action Medication Dose Rate   06/10/15 2353 Given  [1845 dose not given in ED. Pt moved to 3W. Rescheduled for 3W RN to give now.]   vancomycin (VANCOCIN) IVPB 1000 mg/200 mL premix 1,000 mg 200 mL/hr   06/11/15 0021 Given   cefTAZidime (FORTAZ) 2 g in dextrose 5 % 50 mL IVPB 2 g 100 mL/hr   06/11/15 0851 Given   cefTAZidime (FORTAZ) 1 g in dextrose 5 % 50 mL IVPB 1 g 100 mL/hr   06/11/15 1053 Given   vancomycin (VANCOCIN) IVPB 750 mg/150 ml premix 750 mg 150 mL/hr   06/11/15 1641 Given   cefTAZidime (FORTAZ) 1 g in dextrose 5 % 50 mL IVPB 1 g 100 mL/hr   06/12/15 0028 Given   vancomycin (VANCOCIN) IVPB 750 mg/150 ml premix 750 mg 150 mL/hr   06/12/15 0130 Given  [scanned late system downtime]   cefTAZidime (FORTAZ) 1 g in dextrose 5 % 50 mL IVPB 1 g 100 mL/hr   06/12/15 1201 Given   levofloxacin (LEVAQUIN) tablet 500 mg 500 mg    06/13/15 1000 Given   levofloxacin (LEVAQUIN) tablet 500 mg 500 mg       Principal Problem:   HCAP (healthcare-associated pneumonia) Active Problems:   Atrial fibrillation (HCC)   HTN (hypertension)   Dementia   Anxiety    Time spent:    [facility]  [facility] Pager [phone]. If 7PM-7AM, please contact night-coverage at [email or web address], password [facility] 06/13/2015, 10:41 AM  LOS: 3 days

## 2015-06-13 NOTE — [place] FL2 (Signed)
[place] MEDICAID FL2 LEVEL OF CARE SCREENING TOOL     IDENTIFICATION  Patient Name: Sheryl Horn Birthdate: 06/28/1946 Sex: female Admission Date (Current Location): 06/10/2015  [place] [id number] Facility and Address:  [facility],  [address], [place] [id number]      Provider Number: [id number]  Attending Physician Name and Address:  Zannie CovePreetha Joseph, MD  Relative Name and Phone Number:       Current Level of Care: Hospital Recommended Level of Care: Assisted Living Facility, Memory Care Prior Approval Number:    Date Approved/Denied:   PASRR Number: [id number]  Discharge Plan: Other (Comment) (Assisted Living Memory Care Unit)    Current Diagnoses: Patient Active Problem List   Diagnosis Date Noted  . HCAP (healthcare-associated pneumonia) 06/10/2015  . Atrial fibrillation (HCC) 06/10/2015  . HTN (hypertension) 06/10/2015  . Dementia 06/10/2015  . Anxiety 06/10/2015    Orientation ACTIVITIES/SOCIAL BLADDER RESPIRATION    Self    Incontinent Normal  BEHAVIORAL SYMPTOMS/MOOD NEUROLOGICAL BOWEL NUTRITION STATUS      Continent Diet (mechanical soft, thin liquids)  PHYSICIAN VISITS COMMUNICATION OF NEEDS Height & Weight Skin    Verbally 5\' 2"  (157.5 cm) 133 lbs. Normal          AMBULATORY STATUS RESPIRATION    Supervision limited (+1 assist) Normal      Personal Care Assistance Level of Assistance  Bathing, Dressing, Feeding Bathing Assistance: Limited assistance Feeding assistance: Limited assistance (supervision with PO intake) Dressing Assistance: Limited assistance      Functional Limitations Info  Sight, Hearing, Speech Sight Info: Adequate Hearing Info: Adequate Speech Info: Adequate       SPECIAL CARE FACTORS FREQUENCY                      Additional Factors Info  Psychotropic Code Status Info: FULL code status. Allergies Info: Bacitracin, Other, Penicillins, Statins            Current Medications (06/13/2015): Current Facility-Administered Medications  Medication Dose Route Frequency Provider Last Rate Last Dose  . acetaminophen (TYLENOL) tablet 650 mg  650 mg Oral Q6H PRN Bobette Moavid Manuel Ortiz, MD      . albuterol (PROVENTIL) (2.5 MG/3ML) 0.083% nebulizer solution 2.5 mg  2.5 mg Nebulization Q4H PRN Bobette Moavid Manuel Ortiz, MD      . apixaban Everlene Balls(ELIQUIS) tablet 5 mg  5 mg Oral BID Bobette Moavid Manuel Ortiz, MD   5 mg at 06/12/15 2146  . citalopram (CELEXA) tablet 10 mg  10 mg Oral Daily Bobette Moavid Manuel Ortiz, MD   10 mg at 06/12/15 1201  . digoxin (LANOXIN) tablet 0.25 mg  0.25 mg Oral Daily Bobette Moavid Manuel Ortiz, MD   0.25 mg at 06/12/15 1159  . divalproex (DEPAKOTE SPRINKLE) capsule 500 mg  500 mg Oral Q12H Zannie CovePreetha Joseph, MD   500 mg at 06/12/15 2146  . furosemide (LASIX) tablet 20 mg  20 mg Oral Daily Bobette Moavid Manuel Ortiz, MD   20 mg at 06/12/15 1200  . guaiFENesin (ROBITUSSIN) 100 MG/5ML solution 200 mg  10 mL Oral QID Zannie CovePreetha Joseph, MD   200 mg at 06/12/15 1651  . haloperidol lactate (HALDOL) injection 2 mg  2 mg Intravenous Q6H PRN Gerome ApleyLaura A Harduk, PA-C      . levofloxacin (LEVAQUIN) tablet 500 mg  500 mg Oral Daily Zannie CovePreetha Joseph, MD   500 mg at 06/12/15 1201  . lisinopril (PRINIVIL,ZESTRIL) tablet 20 mg  20 mg Oral QHS Ernestene Kielavid Manuel  Robb Matar, MD   20 mg at 06/12/15 2146  . LORazepam (ATIVAN) tablet 0.5 mg  0.5 mg Oral QHS Bobette Mo, MD   0.5 mg at 06/12/15 2146  . LORazepam (ATIVAN) tablet 0.5 mg  0.5 mg Oral Daily PRN Bobette Mo, MD      . metoprolol tartrate (LOPRESSOR) tablet 25 mg  25 mg Oral BID Zannie Cove, MD   25 mg at 06/12/15 2146  . nicotine polacrilex (NICORETTE) gum 2 mg  2 mg Oral PRN Bobette Mo, MD      . risperiDONE (RISPERDAL) tablet 0.125 mg  0.125 mg Oral BID PRN Bobette Mo, MD      . risperiDONE (RISPERDAL) tablet 1 mg  1 mg Oral BID Bobette Mo, MD   1 mg at 06/12/15 2146   Do not use this list as official medication  orders. Please verify with discharge summary.  Discharge Medications:   Medication List    ASK your doctor about these medications        acetaminophen 325 MG tablet  Commonly known as:  TYLENOL  Take 650 mg by mouth every 6 (six) hours as needed for moderate pain.     citalopram 10 MG tablet  Commonly known as:  CELEXA  Take 10 mg by mouth daily.     digoxin 0.25 MG tablet  Commonly known as:  LANOXIN  Take 0.25 mg by mouth daily.     divalproex 500 MG DR tablet  Commonly known as:  DEPAKOTE  Take 500 mg by mouth 2 (two) times daily.     ELIQUIS 5 MG Tabs tablet  Generic drug:  apixaban  Take 5 mg by mouth 2 (two) times daily.     Fluticasone-Salmeterol 500-50 MCG/DOSE Aepb  Commonly known as:  ADVAIR  Inhale 1 puff into the lungs 2 (two) times daily.     furosemide 40 MG tablet  Commonly known as:  LASIX  Take 20 mg by mouth daily.     LORazepam 0.5 MG tablet  Commonly known as:  ATIVAN  Take 0.5 mg by mouth at bedtime.     LORazepam 0.5 MG tablet  Commonly known as:  ATIVAN  Take 0.5 mg by mouth daily as needed for anxiety.     metoprolol succinate 25 MG 24 hr tablet  Commonly known as:  TOPROL-XL  Take 50 mg by mouth daily.     nicotine polacrilex 2 MG gum  Commonly known as:  NICORETTE  Take 2 mg by mouth as needed for smoking cessation.     quinapril 20 MG tablet  Commonly known as:  ACCUPRIL  Take 20 mg by mouth at bedtime.     risperiDONE 0.25 MG tablet  Commonly known as:  RISPERDAL  Take 1 mg by mouth 2 (two) times daily.     risperiDONE 0.25 MG tablet  Commonly known as:  RISPERDAL  Take 0.125 mg by mouth 2 (two) times daily as needed (For agitation.).     VENTOLIN HFA 108 (90 BASE) MCG/ACT inhaler  Generic drug:  albuterol  Inhale 2 puffs into the lungs every 4 (four) hours as needed for wheezing.        Relevant Imaging Results:  Relevant Lab Results:  Recent Labs    Additional Information SSN: [id number]  KIDD, SUZANNA A,  LCSW

## 2015-06-13 NOTE — Clinical Social Work Note (Signed)
Clinical Social Work Assessment  Patient Details  Name: Sheryl Horn MRN: [id number] Date of Birth: 05/31/1946  Date of referral:  06/13/15               Reason for consult:  Discharge Planning                Permission sought to share information with:  Family Supports Permission granted to share information::  Yes, Verbal Permission Granted  Name::     Sheryl Horn  Agency::     Relationship::  brother  Contact Information:  [phone]  Housing/Transportation Living arrangements for the past 2 months:  Assisted Living Facility Source of Information:  Other (Comment Required) (sibling) Patient Interpreter Needed:  None Criminal Activity/Legal Involvement Pertinent to Current Situation/Hospitalization:  No - Comment as needed Significant Relationships:  Siblings Lives with:  Facility Resident Do you feel safe going back to the place where you live?  Yes Need for family participation in patient care:  Yes (Comment)  Care giving concerns:  [facility]. Pt required sitter overnight and [facility] stated that facility could not accept pt back until pt sitter free for 24 hours as facility cannot provide sitter.   Social Worker assessment / plan:  CSW received referral that pt admitted form [facility].   Per chart, pt oriented to person only. CSW contacted pt brother, Sheryl Horn via telephone. CSW introduced self and explained role. Pt brother confirmed that plan will be for pt to return [facility] when medically stable. Pt brother expressed concern about bruising on pt arm and locating pt dentures and glasses. CSW notified pt brother that pt brother would need to speak with RN or MD regarding medical concerns. CSW paged MD and provided pt brother contact information. CSW confirmed with unit RN that pt had dentures at bedside. CSW has [facility] message with [facility] inquiring about pt glasses. CSW updated pt  brother that plan is for pt to return to [facility] tomorrow.  CSW contacted [facility] and facility states that they can accept pt back, but would have to review FL2 prior to pt return. [facility] stated that pt had to be without safety sitter for 24 hours prior to return to ALF. CSW confirmed with RN that safety sitter has been discontinued. CSW faxed FL2 to [facility] for review.   CSW to continue to follow to provide support and assist with pt disposition needs.   Employment status:  Retired Health and safety inspectornsurance information:  [place] Recommendations:  Not assessed at this time Information / Referral to community resources:  Other (Comment Required) (Information sent to [facility])  Patient/Family's Response to care:  Pt has a hx of dementia and oriented to person only. Pt brother involved in pt care, but pt brother lives in [place], so not always able to be at pt bedside. Pt brother agreeable to plan for pt to return [facility].  Patient/Family's Understanding of and Emotional Response to Diagnosis, Current Treatment, and Prognosis:  Pt brother expressed that he had some questions surrounding pt medical care and CSW encouraged pt brother to call unit RN and paged attending MD with pt brother contact information.   Emotional Assessment Appearance:  Appears stated age Attitude/Demeanor/Rapport:  Unable to Assess (oriented to person only)  Affect (typically observed):  Unable to Assess (oriented to person only) Orientation:  Oriented to Self Alcohol / Substance use:  Not Applicable Psych involvement (Current and /or in the community):  No (Comment)  Discharge Needs  Concerns to be addressed:  Discharge Planning Concerns Readmission within the last 30 days:  No Current discharge risk:  None Barriers to Discharge:  Requiring sitter/restraints   KIDD,  SUZANNA A, LCSW 06/13/2015, 3:08 PM  [phone]

## 2015-06-14 MED ORDER — LORAZEPAM 0.5 MG PO TABS: 0.5000 mg | ORAL_TABLET | Freq: Every day | ORAL | Status: DC | PRN

## 2015-06-14 MED ORDER — LEVOFLOXACIN 500 MG PO TABS: 500.0000 mg | ORAL_TABLET | Freq: Every day | ORAL | Status: DC

## 2015-06-14 NOTE — Discharge Summary (Addendum)
Physician Discharge Summary  Sheryl Horn ZOX:[id number] DOB: [birth year] DOA: 06/10/2015  PCP: PROVIDER NOT IN SYSTEM  Admit date: 06/10/2015 Discharge date: 06/14/2015  Time spent: 45 minutes  Recommendations for Outpatient Follow-up:  1. PCP in 1 week 2. Repeat CXR in 4-6weeks  Discharge Diagnoses:  Principal Problem:   HCAP (healthcare-associated pneumonia) Active Problems:   Atrial fibrillation (HCC)   HTN (hypertension)   Dementia   Anxiety   Discharge Condition: stable  Diet recommendation: DYsphagia 3 diet with thin liquids  Filed Weights   06/10/15 2124  Weight: 60.4 kg (133 lb 2.5 oz)    History of present illness:  Chief Complaint: Altered mental status. HPI: Sheryl Horn is a 69 y.o. female with a past medical history of anxiety, dementia, hypertension, atrial fibrillation on Apixaban who is brought to the emergency department via EMS from her nursing home facility due to a change in mental status. Per records, apparently the patient was "not being happy today" which was reported as a change from her usual pleasant demeanor  Hospital Course:  HCAP (healthcare-associated pneumonia) -not very symptomatic,  -s/p 2days of IV Abx, changed to Po levaquin 11/6 -cultures negative Legionella and strep pneumoniae urine antigens-negative -SLP eval completed, D3 diet with thin liquids recommended -continue levaquin for days to complete course   Atrial fibrillation (HCC) -Continue anticoagulation with Eliquis and beta blocker for rate control. -Continued digoxin   HTN (hypertension) -Continue metoprolol and quinapril. -stable   Dementia with agitation -very agitated overnight 11/4, given IV Haldol, stable since then -continue risperidone, reorientation  -Continue Depakote for mood stabilization -improved mood now, discharged back to Assisted Living Facility   Anxiety -Continue QHS Ativan per home regimen  Discharge Exam: Filed Vitals:   06/13/15  1249  Pulse: 86  Resp: 18    General: AAOx to self only, pleasantly confused Cardiovascular: S1S2/RRR Respiratory: CTAB  Discharge Instructions   Discharge Instructions    Diet - low sodium heart healthy    Complete by:  As directed      Increase activity slowly    Complete by:  As directed           Current Discharge Medication List    START taking these medications   Details  levofloxacin (LEVAQUIN) 500 MG tablet Take 1 tablet (500 mg total) by mouth daily. For 3days      CONTINUE these medications which have CHANGED   Details  !! LORazepam (ATIVAN) 0.5 MG tablet Take 1 tablet (0.5 mg total) by mouth daily as needed for anxiety. Qty: 20 tablet, Refills: 0     !! - Potential duplicate medications found. Please discuss with provider.    CONTINUE these medications which have NOT CHANGED   Details  acetaminophen (TYLENOL) 325 MG tablet Take 650 mg by mouth every 6 (six) hours as needed for moderate pain.    albuterol (VENTOLIN HFA) 108 (90 BASE) MCG/ACT inhaler Inhale 2 puffs into the lungs every 4 (four) hours as needed for wheezing.    apixaban (ELIQUIS) 5 MG TABS tablet Take 5 mg by mouth 2 (two) times daily.    citalopram (CELEXA) 10 MG tablet Take 10 mg by mouth daily.    digoxin (LANOXIN) 0.25 MG tablet Take 0.25 mg by mouth daily.    divalproex (DEPAKOTE) 500 MG DR tablet Take 500 mg by mouth 2 (two) times daily.    Fluticasone-Salmeterol (ADVAIR) 500-50 MCG/DOSE AEPB Inhale 1 puff into the lungs 2 (two) times daily.    furosemide (  LASIX) 40 MG tablet Take 20 mg by mouth daily.    !! LORazepam (ATIVAN) 0.5 MG tablet Take 0.5 mg by mouth at bedtime.    metoprolol succinate (TOPROL-XL) 25 MG 24 hr tablet Take 50 mg by mouth daily.    nicotine polacrilex (NICORETTE) 2 MG gum Take 2 mg by mouth as needed for smoking cessation.    quinapril (ACCUPRIL) 20 MG tablet Take 20 mg by mouth at bedtime.    !! risperiDONE (RISPERDAL) 0.25 MG tablet Take 1 mg by  mouth 2 (two) times daily.    !! risperiDONE (RISPERDAL) 0.25 MG tablet Take 0.125 mg by mouth 2 (two) times daily as needed (For agitation.).     !! - Potential duplicate medications found. Please discuss with provider.     Allergies  Allergen Reactions  . Bacitracin Other (See Comments)    Reaction unknown per MAR  . Other Other (See Comments)    HMG-COA reductase inhibitors - reaction unknown per MAR  . Penicillins Other (See Comments)    Reaction unknown per MAR  . Statins Other (See Comments)    Reaction unknown per [facility]   Follow-up Information    Follow up with [facility].   Specialty:  Pediatric Gastroenterology   Why:  pcp per [facility] access response hx is [facility] [address] [place], [place] [address] [phone]      Call This is your assigned medicaid Robbie Lis access doctor If you prefer another contact DSS [phone].   Why:  Medicaid Transportation assists you to your dr appointments: [phone] or [phone] Transportation Supervisor [phone] , As needed   Contact information:   As a Medicaid client you MUST contact them each time you change address, move to another county or another state to keep your address updated [place]:  [place]: [phone] (main) [email or web address] [address] [place], [place] [address]         The results of significant diagnostics from this hospitalization (including imaging, microbiology, ancillary and laboratory) are listed below for reference.    Significant Diagnostic Studies: Dg Chest 2 View  06/10/2015  CLINICAL DATA:  Per EMS pt from [facility] for psychiatric evaluation; per staff pt "has increased emotional agitation; going back and forth; screaming and crying" onset this morning. Staff report pt usually calm; hx of dementia (alert to self ONLY). Elevated WBC EXAM: CHEST  2 VIEW COMPARISON:  06/06/2015 FINDINGS: Compare is airspace consolidation  in the left lower lobe posteriorly and medially, new from the prior exam. Remainder the lungs show prominent bronchovascular markings but are otherwise clear. No pleural effusion. No pneumothorax. Cardiac silhouette is mildly enlarged. No mediastinal or hilar masses or evidence of adenopathy. Bony thorax is demineralized but grossly intact. IMPRESSION: Left lower lobe pneumonia. Electronically Signed   By: Amie Portland M.D.   On: 06/10/2015 18:15   Dg Chest 2 View  06/06/2015  CLINICAL DATA:  Mental status changes.  Dementia up. EXAM: CHEST  2 VIEW COMPARISON:  None. FINDINGS: Heart size is normal. The aorta shows calcification and unfolding. There are abnormal interstitial markings in the lungs most consistent with chronic scarring. No evidence of consolidation or collapse. No effusions. Chronic degenerative changes affect the spine. IMPRESSION: No active disease suspected. Atherosclerosis of the aorta. Pulmonary fibrosis. Electronically Signed   By: Paulina Fusi M.D.   On: 06/06/2015 11:53   Ct Head Wo Contrast  06/10/2015  CLINICAL DATA:  Altered mental status.  History of dementia. EXAM: CT HEAD WITHOUT CONTRAST TECHNIQUE: Contiguous axial images were obtained from the base of the skull through the vertex without intravenous contrast. COMPARISON:  05/27/2015. FINDINGS: Diffusely enlarged ventricles and subarachnoid spaces. Patchy white matter low density in both cerebral hemispheres. No intracranial hemorrhage, mass lesion or CT evidence of acute infarction. Small right sphenoid sinus retention cyst. IMPRESSION: 1. No acute abnormality. 2. Stable atrophy and chronic small vessel white matter ischemic changes. Electronically Signed   By: Beckie Salts M.D.   On: 06/10/2015 17:54   Ct Head Wo Contrast  06/06/2015  CLINICAL DATA:  Change in mental status.  History of dementia. EXAM: CT HEAD WITHOUT CONTRAST TECHNIQUE: Contiguous axial images were obtained from the base of the skull through the vertex  without intravenous contrast. COMPARISON:  Prior study at [facility] on 02/22/2003 FINDINGS: Significant progression of generalized atrophy and moderately advanced small vessel disease in the periventricular white matter since the prior study. The brain demonstrates no evidence of hemorrhage, infarction, edema, mass effect, extra-axial fluid collection, hydrocephalus or mass lesion. The skull is unremarkable. IMPRESSION: Significant progression of atrophy and small vessel disease since the prior study in 2004. No acute findings. Electronically Signed   By: Irish Lack M.D.   On: 06/06/2015 12:10    Microbiology: Recent Results (from the past 240 hour(s))  Blood culture (routine x 2)     Status: None (Preliminary result)   Collection Time: 06/10/15  7:00 PM  Result Value Ref Range Status   Specimen Description BLOOD RIGHT ANTECUBITAL  Final   Special Requests BOTTLES DRAWN AEROBIC AND ANAEROBIC 10CC EACH  Final   Culture   Final    NO GROWTH 3 DAYS Performed at [facility]    Report Status PENDING  Incomplete  Blood culture (routine x 2)     Status: None (Preliminary result)   Collection Time: 06/10/15  7:20 PM  Result Value Ref Range Status   Specimen Description BLOOD LEFT ANTECUBITAL  Final   Special Requests BOTTLES DRAWN AEROBIC AND ANAEROBIC 5CC EACH  Final   Culture   Final    NO GROWTH 3 DAYS Performed at [facility]    Report Status PENDING  Incomplete     Labs: Basic Metabolic Panel:  Recent Labs Lab 06/10/15 1618 06/11/15 0358 06/12/15 0357 06/13/15 0355  NA 141 140 141 140  K 3.6 3.2* 3.4* 3.5  CL 105 111 109 109  CO2 GLUCOSE 91 94 100* 97  BUN CREATININE 0.73 0.57 0.52 0.54  CALCIUM 9.3 8.2* 8.5* 8.4*   Liver Function Tests:  Recent Labs Lab 06/11/15 0358 06/12/15 0357 06/13/15 0355  AST ALT ALKPHOS 60 63 62  BILITOT 1.2 1.1 1.0  PROT 5.1* 5.4* 5.0*  ALBUMIN 2.5* 2.4* 2.3*   No  results for input(s): LIPASE, AMYLASE in the last 168 hours. No results for input(s): AMMONIA in the last 168 hours. CBC:  Recent Labs Lab 06/10/15 1618 06/11/15 0358 06/12/15 0357 06/13/15 0355  WBC 11.4* 9.1 7.9 8.1  NEUTROABS 8.0* 6.1 5.1 4.7  HGB 15.1* 12.2 12.8 12.5  HCT 44.8 36.7 38.1 37.3  MCV 100.0 98.9 99.0 98.9  PLT 173 154 175 201   Cardiac Enzymes: No results for input(s): CKTOTAL, CKMB, CKMBINDEX, TROPONINI in the last 168 hours. BNP: BNP (last 3 results) No results for input(s): BNP in the last 8760  hours.  ProBNP (last 3 results) No results for input(s): PROBNP in the last 8760 hours.  CBG: No results for input(s): GLUCAP in the last 168 hours.     SignedZannie Cove:  Rosangela Fehrenbach  [facility] 06/14/2015, 10:48 AM

## 2015-06-14 NOTE — Progress Notes (Signed)
PHARMACY NOTE -  Abx renal adjustment - Levaquin  Pharmacy has been assisting with dosing of Levaquin for PNA. Dosage remains stable at 500mg  PO daily and need for further dosage adjustment appears unlikely at present.    Will sign off at this time.  Please reconsult if a change in clinical status warrants re-evaluation of dosage.  Hessie KnowsJustin M Tanyon Alipio, PharmD, BCPS Pager [phone] 06/14/2015 8:31 AM

## 2015-06-14 NOTE — Progress Notes (Signed)
Pt for discharge to [facility].   Pt has remained safety sitter free for 24 hours.  CSW facilitated pt discharge needs including contact facility, faxing pt discharge information to [facility] and confirming facility received and reviewed information and confirmed pt can return, discussing with pt at bedside and notifying pt brother, Channing MuttersRoy via telephone, providing RN phone number to call report and arranging ambulance transportation for pt return [facility].   No further social work needs identified at this time.  CSW signing off.   Loletta SpecterSuzanna Kidd, MSW, LCSW Clinical Social Work [phone]

## 2015-06-14 NOTE — Care Management Important Message (Signed)
Important Message  Patient Details  Name: Sheryl Horn MRN: [id number] Date of Birth: 11/18/1945   Medicare Important Message Given:  [facility]    Haskell FlirtJamison, Ceira Hoeschen 06/14/2015, 12:07 PMImportant Message  Patient Details  Name: Sheryl Horn MRN: [id number] Date of Birth: 10/03/1945   Medicare Important Message Given:  Yes-second notification given    Haskell FlirtJamison, Saquan Furtick 06/14/2015, 12:06 PM

## 2015-06-14 NOTE — NC FL2 (Signed)
[place] MEDICAID FL2 LEVEL OF CARE SCREENING TOOL     IDENTIFICATION  Patient Name: Sheryl Horn Birthdate: 05/28/1946 Sex: female Admission Date (Current Location): 06/10/2015  [place] [id number] Facility and Address:  [facility],  [address], [place] [id number]      Provider Number: [id number]  Attending Physician Name and Address:  Zannie CovePreetha Joseph, MD  Relative Name and Phone Number:       Current Level of Care: Hospital Recommended Level of Care: Assisted Living Facility, Memory Care Prior Approval Number:    Date Approved/Denied:   PASRR Number: [id number]  Discharge Plan: Other (Comment) (Assisted Living Memory Care Unit)    Current Diagnoses: Patient Active Problem List   Diagnosis Date Noted  . HCAP (healthcare-associated pneumonia) 06/10/2015  . Atrial fibrillation (HCC) 06/10/2015  . HTN (hypertension) 06/10/2015  . Dementia 06/10/2015  . Anxiety 06/10/2015    Orientation ACTIVITIES/SOCIAL BLADDER RESPIRATION    Self    Incontinent Normal  BEHAVIORAL SYMPTOMS/MOOD NEUROLOGICAL BOWEL NUTRITION STATUS      Continent Diet (mechanical soft, thin liquids)  PHYSICIAN VISITS COMMUNICATION OF NEEDS Height & Weight Skin    Verbally 5\' 2"  (157.5 cm) 133 lbs. Normal          AMBULATORY STATUS RESPIRATION    Supervision limited (+1 assist) Normal      Personal Care Assistance Level of Assistance  Bathing, Dressing, Feeding Bathing Assistance: Limited assistance Feeding assistance: Limited assistance (supervision with PO intake) Dressing Assistance: Limited assistance      Functional Limitations Info  Sight, Hearing, Speech Sight Info: Adequate Hearing Info: Adequate Speech Info: Adequate       SPECIAL CARE FACTORS FREQUENCY                      Additional Factors Info  Psychotropic Code Status Info: FULL code status. Allergies Info: Bacitracin, Other, Penicillins, Statins            Current Medications (06/14/2015): Current Facility-Administered Medications  Medication Dose Route Frequency Provider Last Rate Last Dose  . acetaminophen (TYLENOL) tablet 650 mg  650 mg Oral Q6H PRN Bobette Moavid Manuel Ortiz, MD      . albuterol (PROVENTIL) (2.5 MG/3ML) 0.083% nebulizer solution 2.5 mg  2.5 mg Nebulization Q4H PRN Bobette Moavid Manuel Ortiz, MD      . apixaban Everlene Balls(ELIQUIS) tablet 5 mg  5 mg Oral BID Bobette Moavid Manuel Ortiz, MD   5 mg at 06/13/15 2213  . citalopram (CELEXA) tablet 10 mg  10 mg Oral Daily Bobette Moavid Manuel Ortiz, MD   10 mg at 06/14/15 0957  . digoxin (LANOXIN) tablet 0.25 mg  0.25 mg Oral Daily Bobette Moavid Manuel Ortiz, MD   0.25 mg at 06/14/15 0956  . divalproex (DEPAKOTE SPRINKLE) capsule 500 mg  500 mg Oral Q12H Zannie CovePreetha Joseph, MD   500 mg at 06/14/15 0956  . furosemide (LASIX) tablet 20 mg  20 mg Oral Daily Bobette Moavid Manuel Ortiz, MD   20 mg at 06/14/15 0957  . guaiFENesin (ROBITUSSIN) 100 MG/5ML solution 200 mg  10 mL Oral QID Zannie CovePreetha Joseph, MD   200 mg at 06/14/15 0956  . haloperidol lactate (HALDOL) injection 2 mg  2 mg Intravenous Q6H PRN Gerome ApleyLaura A Harduk, PA-C      . levofloxacin (LEVAQUIN) tablet 500 mg  500 mg Oral Daily Zannie CovePreetha Joseph, MD   500 mg at 06/14/15 0957  . lisinopril (PRINIVIL,ZESTRIL) tablet 20 mg  20 mg Oral QHS Ernestene Kielavid Manuel  Robb Matar, MD   20 mg at 06/13/15 2216  . LORazepam (ATIVAN) tablet 0.5 mg  0.5 mg Oral QHS Bobette Mo, MD   0.5 mg at 06/13/15 2215  . LORazepam (ATIVAN) tablet 0.5 mg  0.5 mg Oral Daily PRN Bobette Mo, MD      . metoprolol tartrate (LOPRESSOR) tablet 25 mg  25 mg Oral BID Zannie Cove, MD   25 mg at 06/14/15 1610  . nicotine polacrilex (NICORETTE) gum 2 mg  2 mg Oral PRN Bobette Mo, MD      . risperiDONE (RISPERDAL) tablet 0.125 mg  0.125 mg Oral BID PRN Bobette Mo, MD      . risperiDONE (RISPERDAL) tablet 1 mg  1 mg Oral BID Bobette Mo, MD   1 mg at 06/14/15 0957   Do not use this list as official medication  orders. Please verify with discharge summary.  Discharge Medications:   Medication List    TAKE these medications        acetaminophen 325 MG tablet  Commonly known as:  TYLENOL  Take 650 mg by mouth every 6 (six) hours as needed for moderate pain.     citalopram 10 MG tablet  Commonly known as:  CELEXA  Take 10 mg by mouth daily.     digoxin 0.25 MG tablet  Commonly known as:  LANOXIN  Take 0.25 mg by mouth daily.     divalproex 500 MG DR tablet  Commonly known as:  DEPAKOTE  Take 500 mg by mouth 2 (two) times daily.     ELIQUIS 5 MG Tabs tablet  Generic drug:  apixaban  Take 5 mg by mouth 2 (two) times daily.     Fluticasone-Salmeterol 500-50 MCG/DOSE Aepb  Commonly known as:  ADVAIR  Inhale 1 puff into the lungs 2 (two) times daily.     furosemide 40 MG tablet  Commonly known as:  LASIX  Take 20 mg by mouth daily.     levofloxacin 500 MG tablet  Commonly known as:  LEVAQUIN  Take 1 tablet (500 mg total) by mouth daily. For 3days     LORazepam 0.5 MG tablet  Commonly known as:  ATIVAN  Take 0.5 mg by mouth at bedtime.     LORazepam 0.5 MG tablet  Commonly known as:  ATIVAN  Take 1 tablet (0.5 mg total) by mouth daily as needed for anxiety.     metoprolol succinate 25 MG 24 hr tablet  Commonly known as:  TOPROL-XL  Take 50 mg by mouth daily.     nicotine polacrilex 2 MG gum  Commonly known as:  NICORETTE  Take 2 mg by mouth as needed for smoking cessation.     quinapril 20 MG tablet  Commonly known as:  ACCUPRIL  Take 20 mg by mouth at bedtime.     risperiDONE 0.25 MG tablet  Commonly known as:  RISPERDAL  Take 1 mg by mouth 2 (two) times daily.     risperiDONE 0.25 MG tablet  Commonly known as:  RISPERDAL  Take 0.125 mg by mouth 2 (two) times daily as needed (For agitation.).     VENTOLIN HFA 108 (90 BASE) MCG/ACT inhaler  Generic drug:  albuterol  Inhale 2 puffs into the lungs every 4 (four) hours as needed for wheezing.        Relevant  Imaging Results:  Relevant Lab Results:  Recent Labs    Additional Information SSN: [id number]  KIDD, Alleen Borne,  LCSW

## 2015-06-15 LAB — CULTURE, BLOOD (ROUTINE X 2)
CULTURE: NO GROWTH
Culture: NO GROWTH

## 2015-06-20 ENCOUNTER — Encounter (HOSPITAL_COMMUNITY): Payer: Self-pay | Admitting: Emergency Medicine

## 2015-06-20 ENCOUNTER — Emergency Department (HOSPITAL_COMMUNITY)
Admission: EM | Admit: 2015-06-20 | Discharge: 2015-06-20 | Disposition: A | Discharge status: Home / Self Care | Payer: Medicare Other | Source: Home / Self Care | Attending: Emergency Medicine | Admitting: Emergency Medicine

## 2015-06-20 ENCOUNTER — Emergency Department (HOSPITAL_COMMUNITY): Payer: Medicare Other

## 2015-06-20 DIAGNOSIS — W19XXXA Unspecified fall, initial encounter: Secondary | ICD-10-CM

## 2015-06-20 DIAGNOSIS — F0390 Unspecified dementia without behavioral disturbance: Secondary | ICD-10-CM

## 2015-06-20 HISTORY — DX: Unspecified atrial fibrillation: I48.91

## 2015-06-20 HISTORY — DX: Unspecified dementia, unspecified severity, without behavioral disturbance, psychotic disturbance, mood disturbance, and anxiety: F03.90

## 2015-06-20 HISTORY — DX: Depression, unspecified: F32.A

## 2015-06-20 HISTORY — DX: Essential (primary) hypertension: I10

## 2015-06-20 HISTORY — DX: Major depressive disorder, single episode, unspecified: F32.9

## 2015-06-20 NOTE — Discharge Instructions (Signed)
It was my pleasure taking care of you today!   1. Medications: Continue usual home medications 2. Treatment: rest, drink plenty of fluids 3. Follow Up: Please follow up with your primary doctor in 3 days for discussion of your diagnoses and further evaluation after today's visit; if you do not have a primary care doctor use the resource guide provided to find one; Please return to the ER for any new or worsening symptoms, any additional concerns.

## 2015-06-20 NOTE — ED Notes (Signed)
Bed: [id number] Expected date:  Expected time:  Means of arrival:  Comments: 69yo fall hit head

## 2015-06-20 NOTE — ED Notes (Signed)
Called [employer] for [facility] transport back to [facility].

## 2015-06-20 NOTE — ED Notes (Signed)
Awake. Verbally responsive. A/O x1. Resp even and unlabored. No audible adventitious breath sounds noted. ABC's intact. IV saline lock patent and intact. Pt NS with 1:1 monitoring.

## 2015-06-20 NOTE — ED Notes (Signed)
Pt returned to room from CT scan and placed at NS for 1:1 monitoring. Pt in wc and no behavior problems at this time.

## 2015-06-20 NOTE — ED Notes (Addendum)
Patient transported to CT. CT tech reported that pt was OOB ambulating around in room with monitor leads off and naked. Pt assisted with gown on.

## 2015-06-20 NOTE — ED Notes (Signed)
Awake. Verbally responsive. A/O x4. Resp even and unlabored. No audible adventitious breath sounds noted. ABC's intact.  

## 2015-06-20 NOTE — ED Notes (Signed)
Pt arrived via EMS with report of while sitting in chair fell backwards hitting head and Eliquis. No report of LOC, nausea, headache or dizziness/lightheadedness. Pt arousable but pleasantly confused.

## 2015-06-20 NOTE — ED Provider Notes (Signed)
CSN: [id number]     Arrival date & time 06/20/15  1418 History   First MD Initiated Contact with Patient 06/20/15 1505     Chief Complaint  Patient presents with  . Fall     (Consider location/radiation/quality/duration/timing/severity/associated sxs/prior Treatment) Patient is a 69 y.o. female presenting with fall. The history is provided by medical records. The history is limited by the condition of the patient. No language interpreter was used.  Fall  Sheryl Horn is a 69 y.o. female  with a PMH of afib, dementia presents to the Emergency Department after falling backwards out of her chair and hitting head. Per EMS, no LOC. Of note, patient is on Eliquis.   Level 5 caveat applies due to dementia  Past Medical History  Diagnosis Date  . Anxiety   . A-fib (HCC)   . Dementia   . Depression   . Hypertension    History reviewed. No pertinent past surgical history. Family History  Problem Relation Age of Onset  . Family history unknown: Yes   Social History  Substance Use Topics  . Smoking status: Unknown If Ever Smoked  . Smokeless tobacco: None  . Alcohol Use: No   OB History    No data available     Review of Systems  Unable to perform ROS: Dementia      Allergies  Bacitracin; Other; Penicillins; and Statins  Home Medications   Prior to Admission medications   Medication Sig Start Date End Date Taking? Authorizing Provider  acetaminophen (TYLENOL) 325 MG tablet Take 650 mg by mouth every 6 (six) hours as needed for moderate pain.    Historical Provider, MD  albuterol (VENTOLIN HFA) 108 (90 BASE) MCG/ACT inhaler Inhale 2 puffs into the lungs every 4 (four) hours as needed for wheezing.    Historical Provider, MD  apixaban (ELIQUIS) 5 MG TABS tablet Take 5 mg by mouth 2 (two) times daily.    Historical Provider, MD  citalopram (CELEXA) 10 MG tablet Take 10 mg by mouth daily.    Historical Provider, MD  digoxin (LANOXIN) 0.25 MG tablet Take 0.25 mg by mouth  daily.    Historical Provider, MD  divalproex (DEPAKOTE) 500 MG DR tablet Take 500 mg by mouth 2 (two) times daily.    Historical Provider, MD  Fluticasone-Salmeterol (ADVAIR) 500-50 MCG/DOSE AEPB Inhale 1 puff into the lungs 2 (two) times daily.    Historical Provider, MD  furosemide (LASIX) 40 MG tablet Take 20 mg by mouth daily.    Historical Provider, MD  levofloxacin (LEVAQUIN) 500 MG tablet Take 1 tablet (500 mg total) by mouth daily. For 3days 06/14/15   Zannie Cove, MD  LORazepam (ATIVAN) 0.5 MG tablet Take 0.5 mg by mouth at bedtime.    Historical Provider, MD  LORazepam (ATIVAN) 0.5 MG tablet Take 1 tablet (0.5 mg total) by mouth daily as needed for anxiety. 06/14/15   Zannie Cove, MD  metoprolol succinate (TOPROL-XL) 25 MG 24 hr tablet Take 50 mg by mouth daily.    Historical Provider, MD  nicotine polacrilex (NICORETTE) 2 MG gum Take 2 mg by mouth as needed for smoking cessation.    Historical Provider, MD  quinapril (ACCUPRIL) 20 MG tablet Take 20 mg by mouth at bedtime.    Historical Provider, MD  risperiDONE (RISPERDAL) 0.25 MG tablet Take 1 mg by mouth 2 (two) times daily.    Historical Provider, MD  risperiDONE (RISPERDAL) 0.25 MG tablet Take 0.125 mg by mouth 2 (two) times  daily as needed (For agitation.).    Historical Provider, MD   BP 117/57 mmHg  Pulse 111  Temp(Src) 97.7 F (36.5 C) (Oral)  Resp 16  SpO2 100% Physical Exam  Constitutional: She appears well-developed and well-nourished.  Awake and in no acute distress  HENT:  Head: Normocephalic and atraumatic. Head is without raccoon's eyes, without Battle's sign, without abrasion, without contusion and without laceration.  Right Ear: Tympanic membrane, external ear and ear canal normal. No hemotympanum.  Left Ear: Tympanic membrane, external ear and ear canal normal. No hemotympanum.  Nose: No rhinorrhea. No epistaxis.  Cardiovascular: Normal rate, regular rhythm, normal heart sounds and intact distal pulses.   Exam reveals no gallop and no friction rub.   No murmur heard. Pulmonary/Chest: Effort normal and breath sounds normal. No respiratory distress. She has no wheezes. She has no rales.  Abdominal: Soft. Bowel sounds are normal. She exhibits no distension. There is no tenderness. There is no rebound and no guarding.  Musculoskeletal: She exhibits no edema.  Neurological:  Non-verbal, unable to follow commands.   Skin: Skin is warm and dry. No rash noted. No erythema.    ED Course  Procedures (including critical care time) Labs Review Labs Reviewed - No data to display  Imaging Review Ct Head Wo Contrast  06/20/2015  CLINICAL DATA:  Fall.  On blood thinners. EXAM: CT HEAD WITHOUT CONTRAST TECHNIQUE: Contiguous axial images were obtained from the base of the skull through the vertex without intravenous contrast. COMPARISON:  06/10/2015 FINDINGS: Sinuses/Soft tissues: No significant soft tissue swelling. Clear paranasal sinuses and mastoid air cells. Intracranial: Mildly advanced cerebral atrophy for age. Cerebellar atrophy is also age advanced. Vertebral and carotid atherosclerosis bilaterally. Moderate low density in the periventricular white matter likely related to small vessel disease. IMPRESSION: 1. No acute or posttraumatic deformity. 2.  Cerebral/cerebellar atrophy and small vessel ischemic change. Electronically Signed   By: Jeronimo GreavesKyle  Talbot M.D.   On: 06/20/2015 16:28   I have personally reviewed and evaluated these images and lab results as part of my medical decision-making.   EKG Interpretation None      MDM   Final diagnoses:  Fall, initial encounter  Dementia, without behavioral disturbance   69 yo CF with hx of dementia, unable to obtain any information from patient - she has been completely non-verbal while I have been in the room. Per EMS and chart records, patient is on Eliquis. Given her age, neurologic status, and taking blood thinners, will scan head to r/o bleed or other  injury.   CT shows no acute or posttraumatic deformity. Will discharge to home in stable condition  Filed Vitals:   06/20/15 1650  BP: 117/57  Pulse: 111  Temp:   Resp: 16    Patient seen by and discussed with Dr. Criss AlvineGoldston who agrees with treatment plan.    [facility] Center[facility]ime Pilcher Camilia Caywood, PA-C 06/20/15 1654  Pricilla LovelessScott Goldston, MD 06/22/15 [phone]

## 2015-06-22 ENCOUNTER — Emergency Department (HOSPITAL_COMMUNITY): Payer: Medicare Other

## 2015-06-22 ENCOUNTER — Encounter (HOSPITAL_COMMUNITY): Payer: Self-pay | Admitting: Emergency Medicine

## 2015-06-22 ENCOUNTER — Inpatient Hospital Stay (HOSPITAL_COMMUNITY)
Admission: EM | Admit: 2015-06-22 | Discharge: 2015-06-27 | DRG: 177 | Disposition: A | Discharge status: Skilled Nursing Facility | Payer: Medicare Other | Attending: Internal Medicine | Admitting: Internal Medicine

## 2015-06-22 DIAGNOSIS — Z880 Allergy status to penicillin: Secondary | ICD-10-CM

## 2015-06-22 DIAGNOSIS — F03918 Unspecified dementia, unspecified severity, with other behavioral disturbance: Secondary | ICD-10-CM | POA: Diagnosis present

## 2015-06-22 DIAGNOSIS — I4891 Unspecified atrial fibrillation: Secondary | ICD-10-CM | POA: Diagnosis present

## 2015-06-22 DIAGNOSIS — W07XXXA Fall from chair, initial encounter: Secondary | ICD-10-CM | POA: Diagnosis present

## 2015-06-22 DIAGNOSIS — I480 Paroxysmal atrial fibrillation: Secondary | ICD-10-CM | POA: Diagnosis not present

## 2015-06-22 DIAGNOSIS — F0391 Unspecified dementia with behavioral disturbance: Secondary | ICD-10-CM | POA: Diagnosis present

## 2015-06-22 DIAGNOSIS — F419 Anxiety disorder, unspecified: Secondary | ICD-10-CM | POA: Diagnosis present

## 2015-06-22 DIAGNOSIS — J189 Pneumonia, unspecified organism: Secondary | ICD-10-CM | POA: Diagnosis not present

## 2015-06-22 DIAGNOSIS — F0390 Unspecified dementia without behavioral disturbance: Secondary | ICD-10-CM | POA: Diagnosis present

## 2015-06-22 DIAGNOSIS — J690 Pneumonitis due to inhalation of food and vomit: Principal | ICD-10-CM | POA: Diagnosis present

## 2015-06-22 DIAGNOSIS — Z7901 Long term (current) use of anticoagulants: Secondary | ICD-10-CM | POA: Diagnosis not present

## 2015-06-22 DIAGNOSIS — Z515 Encounter for palliative care: Secondary | ICD-10-CM | POA: Diagnosis present

## 2015-06-22 DIAGNOSIS — G9340 Encephalopathy, unspecified: Secondary | ICD-10-CM | POA: Diagnosis present

## 2015-06-22 DIAGNOSIS — Y92129 Unspecified place in nursing home as the place of occurrence of the external cause: Secondary | ICD-10-CM

## 2015-06-22 DIAGNOSIS — Z6822 Body mass index (BMI) 22.0-22.9, adult: Secondary | ICD-10-CM | POA: Diagnosis not present

## 2015-06-22 DIAGNOSIS — I10 Essential (primary) hypertension: Secondary | ICD-10-CM | POA: Diagnosis present

## 2015-06-22 DIAGNOSIS — E440 Moderate protein-calorie malnutrition: Secondary | ICD-10-CM | POA: Diagnosis present

## 2015-06-22 DIAGNOSIS — Z881 Allergy status to other antibiotic agents status: Secondary | ICD-10-CM

## 2015-06-22 DIAGNOSIS — Z9181 History of falling: Secondary | ICD-10-CM

## 2015-06-22 DIAGNOSIS — Z79899 Other long term (current) drug therapy: Secondary | ICD-10-CM

## 2015-06-22 DIAGNOSIS — R627 Adult failure to thrive: Secondary | ICD-10-CM | POA: Diagnosis present

## 2015-06-22 DIAGNOSIS — R296 Repeated falls: Secondary | ICD-10-CM | POA: Diagnosis present

## 2015-06-22 DIAGNOSIS — Z888 Allergy status to other drugs, medicaments and biological substances status: Secondary | ICD-10-CM | POA: Diagnosis not present

## 2015-06-22 DIAGNOSIS — Y95 Nosocomial condition: Secondary | ICD-10-CM | POA: Diagnosis present

## 2015-06-22 DIAGNOSIS — I482 Chronic atrial fibrillation: Secondary | ICD-10-CM | POA: Diagnosis not present

## 2015-06-22 DIAGNOSIS — Z720 Tobacco use: Secondary | ICD-10-CM | POA: Diagnosis not present

## 2015-06-22 DIAGNOSIS — F329 Major depressive disorder, single episode, unspecified: Secondary | ICD-10-CM | POA: Diagnosis present

## 2015-06-22 DIAGNOSIS — Z7189 Other specified counseling: Secondary | ICD-10-CM | POA: Insufficient documentation

## 2015-06-22 DIAGNOSIS — R410 Disorientation, unspecified: Secondary | ICD-10-CM | POA: Diagnosis not present

## 2015-06-22 LAB — CBC WITH DIFFERENTIAL/PLATELET
BASOS PCT: 0 %
Basophils Absolute: 0 10*3/uL (ref 0.0–0.1)
EOS ABS: 0 10*3/uL (ref 0.0–0.7)
Eosinophils Relative: 0 %
HCT: 28.9 % — ABNORMAL LOW (ref 36.0–46.0)
Hemoglobin: 9.7 g/dL — ABNORMAL LOW (ref 12.0–15.0)
Lymphocytes Relative: 10 %
Lymphs Abs: 0.9 10*3/uL (ref 0.7–4.0)
MCH: 33.7 pg (ref 26.0–34.0)
MCHC: 33.6 g/dL (ref 30.0–36.0)
MCV: 100.3 fL — ABNORMAL HIGH (ref 78.0–100.0)
MONO ABS: 2 10*3/uL — AB (ref 0.1–1.0)
MONOS PCT: 22 %
NEUTROS PCT: 68 %
Neutro Abs: 6.3 10*3/uL (ref 1.7–7.7)
PLATELETS: 247 10*3/uL (ref 150–400)
RBC: 2.88 MIL/uL — ABNORMAL LOW (ref 3.87–5.11)
RDW: 13.8 % (ref 11.5–15.5)
WBC: 9.3 10*3/uL (ref 4.0–10.5)

## 2015-06-22 LAB — COMPREHENSIVE METABOLIC PANEL
ALBUMIN: 2.6 g/dL — AB (ref 3.5–5.0)
ALT: 15 U/L (ref 14–54)
ANION GAP: 8 (ref 5–15)
AST: 22 U/L (ref 15–41)
Alkaline Phosphatase: 70 U/L (ref 38–126)
BUN: 27 mg/dL — ABNORMAL HIGH (ref 6–20)
CHLORIDE: 102 mmol/L (ref 101–111)
CO2: 29 mmol/L (ref 22–32)
Calcium: 8.9 mg/dL (ref 8.9–10.3)
Creatinine, Ser: 1 mg/dL (ref 0.44–1.00)
GFR calc Af Amer: >60 mL/min (ref >60)
GFR calc non Af Amer: 55 mL/min — ABNORMAL LOW (ref >60)
GLUCOSE: 109 mg/dL — AB (ref 65–99)
POTASSIUM: 4.2 mmol/L (ref 3.5–5.1)
SODIUM: 139 mmol/L (ref 135–145)
TOTAL PROTEIN: 5.2 g/dL — AB (ref 6.5–8.1)
Total Bilirubin: 1.3 mg/dL — ABNORMAL HIGH (ref 0.3–1.2)

## 2015-06-22 LAB — PROTIME-INR
INR: 1.61 — ABNORMAL HIGH (ref 0.00–1.49)
PROTHROMBIN TIME: 19.2 s — AB (ref 11.6–15.2)

## 2015-06-22 LAB — URINALYSIS, ROUTINE W REFLEX MICROSCOPIC
GLUCOSE, UA: NEGATIVE mg/dL
Hgb urine dipstick: NEGATIVE
Ketones, ur: NEGATIVE mg/dL
LEUKOCYTES UA: NEGATIVE
Nitrite: NEGATIVE
PH: 6 (ref 5.0–8.0)
PROTEIN: NEGATIVE mg/dL
Specific Gravity, Urine: 1.026 (ref 1.005–1.030)

## 2015-06-22 LAB — MRSA PCR SCREENING: MRSA by PCR: NEGATIVE

## 2015-06-22 LAB — CBG MONITORING, ED: GLUCOSE-CAPILLARY: 92 mg/dL (ref 65–99)

## 2015-06-22 LAB — STREP PNEUMONIAE URINARY ANTIGEN: Strep Pneumo Urinary Antigen: NEGATIVE

## 2015-06-22 LAB — POC OCCULT BLOOD, ED: FECAL OCCULT BLD: NEGATIVE

## 2015-06-22 MED ORDER — ONDANSETRON HCL 4 MG/2ML IJ SOLN: 4.0000 mg | Freq: Four times a day (QID) | INTRAMUSCULAR | Status: DC | PRN

## 2015-06-22 MED ORDER — DEXTROSE 5 % IV SOLN
2.0000 g | INTRAVENOUS | Status: AC
  Administered 2015-06-22: 2 g via INTRAVENOUS
  Filled 2015-06-22: qty 2

## 2015-06-22 MED ORDER — DIVALPROEX SODIUM 500 MG PO DR TAB
500.0000 mg | DELAYED_RELEASE_TABLET | Freq: Two times a day (BID) | ORAL | Status: DC
  Administered 2015-06-22 – 2015-06-27 (×10): 500 mg via ORAL
  Filled 2015-06-22 (×12): qty 1

## 2015-06-22 MED ORDER — DIGOXIN 250 MCG PO TABS
0.2500 mg | ORAL_TABLET | Freq: Every day | ORAL | Status: DC
  Administered 2015-06-23 – 2015-06-27 (×5): 0.25 mg via ORAL
  Filled 2015-06-22 (×5): qty 1

## 2015-06-22 MED ORDER — ALUM & MAG HYDROXIDE-SIMETH 200-200-20 MG/5ML PO SUSP: 30.0000 mL | Freq: Four times a day (QID) | ORAL | Status: DC | PRN

## 2015-06-22 MED ORDER — VANCOMYCIN HCL 500 MG IV SOLR
500.0000 mg | Freq: Two times a day (BID) | INTRAVENOUS | Status: DC
  Administered 2015-06-23 – 2015-06-24 (×4): 500 mg via INTRAVENOUS
  Filled 2015-06-22 (×5): qty 500

## 2015-06-22 MED ORDER — ACETAMINOPHEN 650 MG RE SUPP: 650.0000 mg | Freq: Four times a day (QID) | RECTAL | Status: DC | PRN

## 2015-06-22 MED ORDER — LISINOPRIL 20 MG PO TABS
20.0000 mg | ORAL_TABLET | Freq: Every day | ORAL | Status: DC
  Administered 2015-06-23: 20 mg via ORAL
  Filled 2015-06-22 (×2): qty 1

## 2015-06-22 MED ORDER — HYDROMORPHONE HCL 1 MG/ML IJ SOLN: 0.5000 mg | INTRAMUSCULAR | Status: DC | PRN

## 2015-06-22 MED ORDER — APIXABAN 5 MG PO TABS
5.0000 mg | ORAL_TABLET | Freq: Two times a day (BID) | ORAL | Status: DC
  Administered 2015-06-22 – 2015-06-27 (×10): 5 mg via ORAL
  Filled 2015-06-22 (×13): qty 1

## 2015-06-22 MED ORDER — DEXTROSE 5 % IV SOLN
1.0000 g | Freq: Two times a day (BID) | INTRAVENOUS | Status: DC
  Administered 2015-06-23 (×2): 1 g via INTRAVENOUS
  Filled 2015-06-22 (×3): qty 1

## 2015-06-22 MED ORDER — OXYCODONE HCL 5 MG PO TABS
5.0000 mg | ORAL_TABLET | ORAL | Status: DC | PRN
  Administered 2015-06-23: 5 mg via ORAL
  Filled 2015-06-22: qty 1

## 2015-06-22 MED ORDER — METOPROLOL SUCCINATE ER 50 MG PO TB24
50.0000 mg | ORAL_TABLET | Freq: Every day | ORAL | Status: DC
  Administered 2015-06-23 – 2015-06-24 (×2): 50 mg via ORAL
  Filled 2015-06-22 (×2): qty 1

## 2015-06-22 MED ORDER — ONDANSETRON HCL 4 MG PO TABS
4.0000 mg | ORAL_TABLET | Freq: Four times a day (QID) | ORAL | Status: DC | PRN
Start: 2015-06-22 — End: 2015-06-27

## 2015-06-22 MED ORDER — METRONIDAZOLE IN NACL 5-0.79 MG/ML-% IV SOLN
500.0000 mg | Freq: Three times a day (TID) | INTRAVENOUS | Status: DC
  Administered 2015-06-22 – 2015-06-24 (×7): 500 mg via INTRAVENOUS
  Filled 2015-06-22 (×9): qty 100

## 2015-06-22 MED ORDER — DEXTROSE 5 % IV SOLN: 2.0000 g | Freq: Three times a day (TID) | INTRAVENOUS | Status: DC

## 2015-06-22 MED ORDER — VANCOMYCIN HCL IN DEXTROSE 1-5 GM/200ML-% IV SOLN
1000.0000 mg | INTRAVENOUS | Status: AC
  Administered 2015-06-22: 1000 mg via INTRAVENOUS

## 2015-06-22 MED ORDER — RISPERIDONE 1 MG PO TABS
1.0000 mg | ORAL_TABLET | Freq: Two times a day (BID) | ORAL | Status: DC
  Administered 2015-06-22 – 2015-06-25 (×6): 1 mg via ORAL
  Filled 2015-06-22 (×8): qty 1

## 2015-06-22 MED ORDER — ACETAMINOPHEN 325 MG PO TABS: 650.0000 mg | ORAL_TABLET | Freq: Four times a day (QID) | ORAL | Status: DC | PRN

## 2015-06-22 MED ORDER — SODIUM CHLORIDE 0.9 % IV SOLN
INTRAVENOUS | Status: DC
  Administered 2015-06-23 – 2015-06-24 (×2): via INTRAVENOUS

## 2015-06-22 MED ORDER — FUROSEMIDE 20 MG PO TABS
20.0000 mg | ORAL_TABLET | Freq: Every day | ORAL | Status: DC
  Administered 2015-06-23: 20 mg via ORAL
  Filled 2015-06-22 (×2): qty 1

## 2015-06-22 MED ORDER — QUINAPRIL HCL 10 MG PO TABS: 20.0000 mg | ORAL_TABLET | Freq: Every day | ORAL | Status: DC

## 2015-06-22 NOTE — ED Notes (Signed)
Pt placed on bedpan, stated she had urinated and was done. However when bedpan was removed, no urine was noted.

## 2015-06-22 NOTE — ED Notes (Signed)
Pt's brother is at bedside. States that she has been going down hill with her mentation and mobility x 2 months. PA made aware that brother wants to speak with him.

## 2015-06-22 NOTE — ED Notes (Signed)
Bed: [id number] Expected date:  Expected time:  Means of arrival:  Comments: Ems- 69 yo AMS, hx dementia and bipolar

## 2015-06-22 NOTE — H&P (Addendum)
[facility] Admission History and Physical       Aryanah Enslow ZOX:[id number] DOB: 05/01/1946 DOA: 06/22/2015  Referring physician: EDP PCP: PROVIDER NOT IN SYSTEM  Specialists:   Chief Complaint: Increased Confusion  HPI: Sheryl Horn is a 69 y.o. female with Atrial Fibrillation on Eliquis Rx, HTN, and Dementia who was sent from her Assisted Living Center to the ED due to increased confusion, and she fell out of her chair and hit her head but was without injury.  She had not LOC.     A chest X-ray revealed findings of a   Bilateral Airspace Opacities and RLL pneumonia suggestive of possible aspiration pneumonia.   She had a recent hospitalization for CAP and now had been placed on antibiotic rx for HCAP.  She was seen in the ED and had a CT  Scan of her head which was negative for acute findings and she was referred for admission.       Review of Systems: Unable to Obtain from the Patient  Past Medical History  Diagnosis Date  . Anxiety   . A-fib (HCC)   . Dementia   . Depression   . Hypertension      History reviewed. No pertinent past surgical history.    Prior to Admission medications   Medication Sig Start Date End Date Taking? Authorizing Provider  acetaminophen (TYLENOL) 325 MG tablet Take 650 mg by mouth every 6 (six) hours as needed for moderate pain.   Yes Historical Provider, MD  albuterol (VENTOLIN HFA) 108 (90 BASE) MCG/ACT inhaler Inhale 2 puffs into the lungs every 4 (four) hours as needed for wheezing.   Yes Historical Provider, MD  apixaban (ELIQUIS) 5 MG TABS tablet Take 5 mg by mouth 2 (two) times daily.   Yes Historical Provider, MD  citalopram (CELEXA) 10 MG tablet Take 10 mg by mouth daily.   Yes Historical Provider, MD  digoxin (LANOXIN) 0.25 MG tablet Take 0.25 mg by mouth daily.   Yes Historical Provider, MD  divalproex (DEPAKOTE) 500 MG DR tablet Take 500 mg by mouth 2 (two) times daily.   Yes Historical Provider, MD  Fluticasone-Salmeterol  (ADVAIR) 500-50 MCG/DOSE AEPB Inhale 1 puff into the lungs 2 (two) times daily.   Yes Historical Provider, MD  furosemide (LASIX) 40 MG tablet Take 20 mg by mouth daily.   Yes Historical Provider, MD  LORazepam (ATIVAN) 0.5 MG tablet Take 0.5 mg by mouth at bedtime.   Yes Historical Provider, MD  LORazepam (ATIVAN) 0.5 MG tablet Take 1 tablet (0.5 mg total) by mouth daily as needed for anxiety. 06/14/15  Yes Zannie Cove, MD  metoprolol succinate (TOPROL-XL) 25 MG 24 hr tablet Take 50 mg by mouth daily.   Yes Historical Provider, MD  nicotine polacrilex (NICORETTE) 2 MG gum Take 2 mg by mouth as needed for smoking cessation.   Yes Historical Provider, MD  quinapril (ACCUPRIL) 20 MG tablet Take 20 mg by mouth daily.    Yes Historical Provider, MD  risperiDONE (RISPERDAL) 0.25 MG tablet Take 1 mg by mouth 2 (two) times daily.   Yes Historical Provider, MD  risperiDONE (RISPERDAL) 0.25 MG tablet Take 0.125 mg by mouth 2 (two) times daily as needed (For agitation.).   Yes Historical Provider, MD  levofloxacin (LEVAQUIN) 500 MG tablet Take 1 tablet (500 mg total) by mouth daily. For 3days Patient not taking: Reported on 06/22/2015 06/14/15   Zannie Cove, MD     Allergies  Allergen Reactions  .  Bacitracin Other (See Comments)    Reaction unknown per MAR  . Other Other (See Comments)    HMG-COA reductase inhibitors - reaction unknown per MAR  . Penicillins Other (See Comments)    Reaction unknown per MAR  . Statins Other (See Comments)    [facility]    Social History:  reports that she does not drink alcohol or use illicit drugs. Her tobacco history is not on file.    Family History  Problem Relation Age of Onset  . Family history unknown: Yes       Physical Exam:  GEN:  Pleasant  69 y.o. female examined and in no acute distress; cooperative with exam Filed Vitals:   06/22/15 1656 06/22/15 1908  BP: 122/56 137/65  Pulse: 95 86  Temp: 98.7 F (37.1 C)   TempSrc:  Oral   Resp: 16 20  SpO2: 98% 100%   Blood pressure 137/65, pulse 86, temperature 98.7 F (37.1 C), temperature source Oral, resp. rate 20, SpO2 100 %. PSYCH: She is alert and oriented x2; does not appear anxious does not appear depressed; affect is normal HEENT: Normocephalic and Atraumatic, Mucous membranes pink; PERRLA; EOM intact; Fundi:  Benign;  No scleral icterus, Nares: Patent, Oropharynx: Clear,    Neck:  FROM, No Cervical Lymphadenopathy nor Thyromegaly or Carotid Bruit; No JVD; Breasts:: Not examined CHEST WALL: No tenderness CHEST: Normal respiration, clear to auscultation bilaterally HEART: Regular rate and rhythm; no murmurs rubs or gallops BACK: No kyphosis or scoliosis; No CVA tenderness ABDOMEN: Positive Bowel Sounds, Scaphoid, Soft Non-Tender, No Rebound or Guarding; No Masses, No Organomegaly Rectal Exam: Not done EXTREMITIES: No Cyanosis, Clubbing, or Edema; No Ulcerations. Genitalia: not examined PULSES: 2+ and symmetric SKIN: Normal hydration no rash or ulceration CNS:  Alert and Oriented x 2, No Focal Deficits Vascular: pulses palpable throughout    Labs on Admission:  Basic Metabolic Panel:  Recent Labs Lab 06/22/15 1721  NA 139  K 4.2  CL 102  CO2 29  GLUCOSE 109*  BUN 27*  CREATININE 1.00  CALCIUM 8.9   Liver Function Tests:  Recent Labs Lab 06/22/15 1721  AST 22  ALT 15  ALKPHOS 70  BILITOT 1.3*  PROT 5.2*  ALBUMIN 2.6*   No results for input(s): LIPASE, AMYLASE in the last 168 hours. No results for input(s): AMMONIA in the last 168 hours. CBC:  Recent Labs Lab 06/22/15 1721  WBC 9.3  NEUTROABS 6.3  HGB 9.7*  HCT 28.9*  MCV 100.3*  PLT 247   Cardiac Enzymes: No results for input(s): CKTOTAL, CKMB, CKMBINDEX, TROPONINI in the last 168 hours.  BNP (last 3 results) No results for input(s): BNP in the last 8760 hours.  ProBNP (last 3 results) No results for input(s): PROBNP in the last 8760 hours.  CBG:  Recent  Labs Lab 06/22/15 1728  GLUCAP 92    Radiological Exams on Admission: Dg Chest 2 View  06/22/2015  CLINICAL DATA:  Altered mental status with shortness of breath today. History of dementia. Initial encounter. EXAM: CHEST  2 VIEW COMPARISON:  06/06/2015 and 06/10/2015. FINDINGS: The heart size and mediastinal contours are stable with aortic atherosclerosis. Generalized interstitial prominence appears chronic and unchanged. There are superimposed bibasilar airspace opacities with more density on right compared with the most recent study. There is no significant pleural effusion. The bones appear unchanged IMPRESSION: Bibasilar airspace opacities with interval worsening on the right, suspicious for aspiration pneumonia. Electronically Signed   By:  Carey Bullocks M.D.   On: 06/22/2015 18:51     CLINICAL DATA: Fall. On blood thinners.  EXAM: CT HEAD WITHOUT CONTRAST  TECHNIQUE: Contiguous axial images were obtained from the base of the skull through the vertex without intravenous contrast.  COMPARISON: 06/10/2015  FINDINGS: Sinuses/Soft tissues: No significant soft tissue swelling. Clear paranasal sinuses and mastoid air cells.  Intracranial: Mildly advanced cerebral atrophy for age. Cerebellar atrophy is also age advanced. Vertebral and carotid atherosclerosis bilaterally. Moderate low density in the periventricular white matter likely related to small vessel disease.  IMPRESSION: 1. No acute or posttraumatic deformity. 2. Cerebral/cerebellar atrophy and small vessel ischemic change.   Electronically Signed  By: Jeronimo Greaves M.D.  On: 06/20/2015 16:28     EKG: Independently reviewed.         Assessment/Plan:  69 y.o. female with  Active Problems:   1.     HCAP (healthcare-associated pneumonia)   IV Vancomycin and Fortaz     2.     Acute encephalopathy- due to #1   Monitor     3.     Atrial fibrillation (HCC)   On Eliquis   On Metoprolol     4.      HTN (hypertension)   On Metoprolol     5.     Dementia   Monitor     6.     DVT Prophylaxis   On Eliquis    Code Status:     FULL CODE    Family Communication:    No Family Present    Disposition Plan:    Inpatient  Observation Status        Time spent:  37 70 Minutes      Ron Parker [facility] Pager [phone]   If 7AM -7PM Please Contact the Day Rounding Team MD for [facility]  If 7PM-7AM, Please Contact Night-Floor Coverage  [email or web address] Password [email or web address] 06/22/2015, 8:50 PM     ADDENDUM:   Patient was seen and examined on 06/22/2015

## 2015-06-22 NOTE — ED Notes (Addendum)
Kathlen BrunswickRAY Amyx [facility]) - [phone]

## 2015-06-22 NOTE — ED Notes (Addendum)
Pt was here two days ago but family went to SNF [facility]([place]) today and stated that she has a hx of bipolar and dementia and she is not acting herself. Family is coming to be with patient. Alert.

## 2015-06-22 NOTE — Progress Notes (Addendum)
ANTIBIOTIC CONSULT NOTE - INITIAL  Pharmacy Consult for Aspiration PNA Indication: Ceftazidime / Flagyl / Vancomycin   Allergies  Allergen Reactions  . Bacitracin Other (See Comments)    Reaction unknown per MAR  . Other Other (See Comments)    HMG-COA reductase inhibitors - reaction unknown per MAR  . Penicillins Other (See Comments)    Reaction unknown per MAR  . Statins Other (See Comments)    Reaction unknown per [facility]    Patient Measurements:   Adjusted Body Weight:   Vital Signs: Temp: 98.7 F (37.1 C) (11/16 1656) Temp Source: Oral (11/16 1656) BP: 137/65 mmHg (11/16 1908) Pulse Rate: 86 (11/16 1908) Intake/Output from previous day:   Intake/Output from this shift: Total I/O In: -  Out: 15 [Urine:15]  Labs:  Recent Labs  06/22/15 1721  WBC 9.3  HGB 9.7*  PLT 247  CREATININE 1.00   Estimated Creatinine Clearance: 44.2 mL/min (by C-G formula based on Cr of 1). No results for input(s): VANCOTROUGH, VANCOPEAK, VANCORANDOM, GENTTROUGH, GENTPEAK, GENTRANDOM, TOBRATROUGH, TOBRAPEAK, TOBRARND, AMIKACINPEAK, AMIKACINTROU, AMIKACIN in the last 72 hours.   Microbiology: Recent Results (from the past 720 hour(s))  Blood culture (routine x 2)     Status: None   Collection Time: 06/10/15  7:00 PM  Result Value Ref Range Status   Specimen Description BLOOD RIGHT ANTECUBITAL  Final   Special Requests BOTTLES DRAWN AEROBIC AND ANAEROBIC 10CC EACH  Final   Culture   Final    NO GROWTH 5 DAYS [facility]    Report Status 06/15/2015 FINAL  Final  Blood culture (routine x 2)     Status: None   Collection Time: 06/10/15  7:20 PM  Result Value Ref Range Status   Specimen Description BLOOD LEFT ANTECUBITAL  Final   Special Requests BOTTLES DRAWN AEROBIC AND ANAEROBIC 5CC EACH  Final   Culture   Final    NO GROWTH 5 DAYS [facility]     Report Status 06/15/2015 FINAL  Final    Medical History: Past Medical History   Diagnosis Date  . Anxiety   . A-fib (HCC)   . Dementia   . Depression   . Hypertension    Assessment: [age] yoF with PMHx atrial fibrillation on chronic apixaban, dementia, bipolar disorder, and HTN and recent hospitalization for HCAP brought to ED from NH for AMS.  Pt also seen in ED 11/14 after fall and discharged home after negative work-up. Chest xray shows bibasilar airspace opacities suspicious for aspiration PNA.  Pharmacy consulted to start ceftazidime and vancomycin, MD dosing flagyl. Allergy noted to penicillins (reaction unknown).  Pt has tolerated ceftazidime in the past.   Anti-infectives 11/16 >> Ceftazidime  >> 11/16 >> Flagyl  >>   11/16 >> Vancomycin >>  Vitals/Labs WBC: WNL Tm24h: 98.7 Renal: SCr 1.00 (~0.5 last 11/5-11/7), CrCl 44 CG (58N)  Cultures None yet  Goal of Therapy:  Eradication of infection Vancomycin trough 15-120mcg/ml  Plan:  Ceftazidime 2g IV x 1 then 1g q12h. Vancomycin 1000mg  IV x1, then 500mg  IV q12h Continue flagyl 500mg  IV q8h Follow renal function, drug levels as needed, micro, clinical course  Sheryl Horn, PharmD, BCPS 06/22/2015, 8:19 PM  Pager: [phone]

## 2015-06-22 NOTE — Progress Notes (Signed)
Patients brother, Channing MuttersRoy, who reports that he is the patients POA, called to speak with nurse caring for his sister. We had a lengthy conversation in which he expressed concerns about pts living situation at the assisted living facility. He reports that when patient went to the facility, she was able to perform all ADLs on her own. Her mentation declined recently, her brother states "She is in a zombie state of mind", "She was so dazed that she could not make eye contact with me".  He states "I didn't know they were medicating her to keep her calm, and I think they are over-doing it". He reports that her teeth and glasses have been lost at the facility. He says that staff reports she has been falling a lot which resulted in bruising all over her body. He states that someone at the facility told him she has aggressive dementia and will not live much longer.  Pt brother and POA is concerned about the safety and current living condition of the patient. He asked that we "investigate". He requests that she sees physical therapy. He also requests social work to be involved. Pt brother [place]. This RN told him PT and social work would be consulted and this info would be passed on in the AM. He plans to call 11/17 at 1400-1500 for updates about his sister.

## 2015-06-22 NOTE — ED Notes (Signed)
Pts brother has left, stretcher alarm placed on bed for precaution

## 2015-06-22 NOTE — ED Provider Notes (Signed)
CSN: [id number]     Arrival date & time 06/22/15  1652 History   First MD Initiated Contact with Patient 06/22/15 1700     Chief Complaint  Patient presents with  . Altered Mental Status   Level V caveat: Altered mental status/dementia  (Consider location/radiation/quality/duration/timing/severity/associated sxs/prior Treatment) HPI Sheryl Horn is a 69 y.o. female with a history of dementia, A. fib-on Eliquis , comes in for evaluation of altered mental status. Patient presents via EMS from SNF [facility] for evaluation of "not acting herself". Patient is nonverbal and does not cooperate with physical exam. Family is coming to be with patient. Patient was recently seen in the ED 2 days ago on 11/14 for evaluation after a fall and had a negative workup including negative head CT at that time.  Past Medical History  Diagnosis Date  . Anxiety   . A-fib (HCC)   . Dementia   . Depression   . Hypertension    History reviewed. No pertinent past surgical history. Family History  Problem Relation Age of Onset  . Family history unknown: Yes   Social History  Substance Use Topics  . Smoking status: Unknown If Ever Smoked  . Smokeless tobacco: Current User    Types: Snuff, Chew  . Alcohol Use: No   OB History    No data available     Review of Systems  Unable to perform ROS: Dementia      Allergies  Bacitracin; Other; Penicillins; and Statins  Home Medications   Prior to Admission medications   Medication Sig Start Date End Date Taking? Authorizing Provider  acetaminophen (TYLENOL) 325 MG tablet Take 650 mg by mouth every 6 (six) hours as needed for moderate pain.   Yes Historical Provider, MD  albuterol (VENTOLIN HFA) 108 (90 BASE) MCG/ACT inhaler Inhale 2 puffs into the lungs every 4 (four) hours as needed for wheezing.   Yes Historical Provider, MD  apixaban (ELIQUIS) 5 MG TABS tablet Take 5 mg by mouth 2 (two) times daily.   Yes Historical Provider, MD  citalopram  (CELEXA) 10 MG tablet Take 10 mg by mouth daily.   Yes Historical Provider, MD  digoxin (LANOXIN) 0.25 MG tablet Take 0.25 mg by mouth daily.   Yes Historical Provider, MD  divalproex (DEPAKOTE) 500 MG DR tablet Take 500 mg by mouth 2 (two) times daily.   Yes Historical Provider, MD  Fluticasone-Salmeterol (ADVAIR) 500-50 MCG/DOSE AEPB Inhale 1 puff into the lungs 2 (two) times daily.   Yes Historical Provider, MD  furosemide (LASIX) 40 MG tablet Take 20 mg by mouth daily.   Yes Historical Provider, MD  LORazepam (ATIVAN) 0.5 MG tablet Take 0.5 mg by mouth at bedtime.   Yes Historical Provider, MD  LORazepam (ATIVAN) 0.5 MG tablet Take 1 tablet (0.5 mg total) by mouth daily as needed for anxiety. 06/14/15  Yes Zannie CovePreetha Joseph, MD  metoprolol succinate (TOPROL-XL) 25 MG 24 hr tablet Take 50 mg by mouth daily.   Yes Historical Provider, MD  nicotine polacrilex (NICORETTE) 2 MG gum Take 2 mg by mouth as needed for smoking cessation.   Yes Historical Provider, MD  quinapril (ACCUPRIL) 20 MG tablet Take 20 mg by mouth daily.    Yes Historical Provider, MD  risperiDONE (RISPERDAL) 0.25 MG tablet Take 1 mg by mouth 2 (two) times daily.   Yes Historical Provider, MD  risperiDONE (RISPERDAL) 0.25 MG tablet Take 0.125 mg by mouth 2 (two) times daily as needed (For agitation.).  Yes Historical Provider, MD  levofloxacin (LEVAQUIN) 500 MG tablet Take 1 tablet (500 mg total) by mouth daily. For 3days Patient not taking: Reported on 06/22/2015 06/14/15   Zannie Cove, MD   BP 107/48 mmHg  Pulse 95  Temp(Src) 97.7 F (36.5 C) (Oral)  Resp 20  SpO2 98% Physical Exam  Constitutional: She appears well-developed and well-nourished.  Patient is minimally verbal. Can only follow very basic commands.  HENT:  Head: Normocephalic and atraumatic.  Mouth/Throat: Oropharynx is clear and moist.  Eyes: Conjunctivae are normal. Pupils are equal, round, and reactive to light. Right eye exhibits no discharge. Left eye  exhibits no discharge. No scleral icterus.  Neck: Neck supple.  Cardiovascular: Normal rate, regular rhythm and normal heart sounds.   Pulmonary/Chest: Effort normal and breath sounds normal. No respiratory distress. She has no wheezes. She has no rales.  Abdominal: Soft. There is no tenderness.  Genitourinary:  Female chaperone present for rectal exam. Soft brown stool on exam glove. No frank blood or other obvious source of GI bleed.  Musculoskeletal: She exhibits no tenderness.  Neurological: She is alert.  She is pleasant, but disoriented  Skin: Skin is warm and dry. No rash noted.  Psychiatric: She has a normal mood and affect.  Nursing note and vitals reviewed.   ED Course  Procedures (including critical care time) Labs Review Labs Reviewed  CBC WITH DIFFERENTIAL/PLATELET - Abnormal; Notable for the following:    RBC 2.88 (*)    Hemoglobin 9.7 (*)    HCT 28.9 (*)    MCV 100.3 (*)    Monocytes Absolute 2.0 (*)    All other components within normal limits  COMPREHENSIVE METABOLIC PANEL - Abnormal; Notable for the following:    Glucose, Bld 109 (*)    BUN 27 (*)    Total Protein 5.2 (*)    Albumin 2.6 (*)    Total Bilirubin 1.3 (*)    GFR calc non Af Amer 55 (*)    All other components within normal limits  URINALYSIS, ROUTINE W REFLEX MICROSCOPIC (NOT AT [facility]) - Abnormal; Notable for the following:    Color, Urine AMBER (*)    APPearance CLOUDY (*)    Bilirubin Urine SMALL (*)    All other components within normal limits  PROTIME-INR - Abnormal; Notable for the following:    Prothrombin Time 19.2 (*)    INR 1.61 (*)    All other components within normal limits  MRSA PCR SCREENING  CULTURE, BLOOD (ROUTINE X 2)  CULTURE, BLOOD (ROUTINE X 2)  GRAM STAIN  STREP PNEUMONIAE URINARY ANTIGEN  BASIC METABOLIC PANEL  CBC  CBG MONITORING, ED  POC OCCULT BLOOD, ED    Imaging Review Dg Chest 2 View  06/22/2015  CLINICAL DATA:  Altered mental status with shortness of  breath today. History of dementia. Initial encounter. EXAM: CHEST  2 VIEW COMPARISON:  06/06/2015 and 06/10/2015. FINDINGS: The heart size and mediastinal contours are stable with aortic atherosclerosis. Generalized interstitial prominence appears chronic and unchanged. There are superimposed bibasilar airspace opacities with more density on right compared with the most recent study. There is no significant pleural effusion. The bones appear unchanged IMPRESSION: Bibasilar airspace opacities with interval worsening on the right, suspicious for aspiration pneumonia. Electronically Signed   By: Carey Bullocks M.D.   On: 06/22/2015 18:51   I have personally reviewed and evaluated these images and lab results as part of my medical decision-making.   EKG Interpretation None  Meds given in ED:  Medications  metroNIDAZOLE (FLAGYL) IVPB 500 mg (500 mg Intravenous New Bag/Given 06/22/15 2105)  cefTAZidime (FORTAZ) 1 g in dextrose 5 % 50 mL IVPB (not administered)  vancomycin (VANCOCIN) IVPB 1000 mg/200 mL premix (1,000 mg Intravenous Given by Other 06/22/15 2000)    Followed by  vancomycin (VANCOCIN) 500 mg in sodium chloride 0.9 % 100 mL IVPB (not administered)  divalproex (DEPAKOTE) DR tablet 500 mg (500 mg Oral Given 06/22/15 2308)  digoxin (LANOXIN) tablet 0.25 mg (not administered)  furosemide (LASIX) tablet 20 mg (not administered)  risperiDONE (RISPERDAL) tablet 1 mg (1 mg Oral Given 06/22/15 2308)  metoprolol succinate (TOPROL-XL) 24 hr tablet 50 mg (not administered)  apixaban (ELIQUIS) tablet 5 mg (5 mg Oral Given 06/22/15 2308)  0.9 %  sodium chloride infusion ( Intravenous Rate/Dose Change 06/22/15 2156)  acetaminophen (TYLENOL) tablet 650 mg (not administered)    Or  acetaminophen (TYLENOL) suppository 650 mg (not administered)  oxyCODONE (Oxy IR/ROXICODONE) immediate release tablet 5 mg (not administered)  HYDROmorphone (DILAUDID) injection 0.5-1 mg (not administered)   ondansetron (ZOFRAN) tablet 4 mg (not administered)    Or  ondansetron (ZOFRAN) injection 4 mg (not administered)  alum & mag hydroxide-simeth (MAALOX/MYLANTA) 200-200-20 MG/5ML suspension 30 mL (not administered)  lisinopril (PRINIVIL,ZESTRIL) tablet 20 mg (not administered)  cefTAZidime (FORTAZ) 2 g in dextrose 5 % 50 mL IVPB (2 g Intravenous New Bag/Given 06/22/15 2102)    Current Discharge Medication List     Filed Vitals:   06/22/15 2015 06/22/15 2030 06/22/15 2100 06/22/15 2137  BP: 108/46 89/56 102/55 107/48  Pulse: 87 75 64 95  Temp:    97.7 F (36.5 C)  TempSrc:    Oral  Resp:    20  SpO2: 100% 100% 100% 98%    MDM  Tanza Pellot is a 70 y.o. female with history of dementia, recent hospital admission for pneumonia comes in via EMS from skilled nursing facility for evaluation of altered mental status. Her brother is at bedside and reports that her mental status has decreased rapidly over the past 2 weeks. On arrival, vital signs are stable and she is afebrile. She is not cooperative with physical exam and is unable to answer questions. Basic labs show hemoglobin of 9.7, down from 12.5 nine days ago. She has elevated BUN at 27. No evidence of UTI. Hemoccult is negative. Chest x-ray shows evidence of worsening right-sided pneumonia. Initiated ceftazidime and metronidazole to cover for possible aspiration and vancomycin for healthcare associated pneumonia. Patient would benefit from a medical admission for further evaluation and management of symptoms. Discussed with hospitalist, Dr. Lovell Sheehan to see in the ED. Patient admitted to medical service. Prior to admission, discussed and reviewed this case with my attending, Dr. Rubin Payor who agrees with plan for medical admission. Final diagnoses:  HCAP (healthcare-associated pneumonia)       Joycie Peek, PA-C 06/22/15 2359  Benjiman Core, MD 06/23/15 0021

## 2015-06-22 NOTE — ED Notes (Signed)
PA at bedside.

## 2015-06-23 DIAGNOSIS — E440 Moderate protein-calorie malnutrition: Secondary | ICD-10-CM

## 2015-06-23 DIAGNOSIS — I482 Chronic atrial fibrillation: Secondary | ICD-10-CM

## 2015-06-23 DIAGNOSIS — G9340 Encephalopathy, unspecified: Secondary | ICD-10-CM | POA: Diagnosis present

## 2015-06-23 LAB — IRON AND TIBC
Iron: 42 ug/dL (ref 28–170)
Saturation Ratios: 22 % (ref 10.4–31.8)
TIBC: 193 ug/dL — ABNORMAL LOW (ref 250–450)
UIBC: 151 ug/dL

## 2015-06-23 LAB — BASIC METABOLIC PANEL
Anion gap: 5 (ref 5–15)
BUN: 22 mg/dL — AB (ref 6–20)
CHLORIDE: 107 mmol/L (ref 101–111)
CO2: 29 mmol/L (ref 22–32)
CREATININE: 0.72 mg/dL (ref 0.44–1.00)
Calcium: 8.3 mg/dL — ABNORMAL LOW (ref 8.9–10.3)
GFR calc Af Amer: >60 mL/min (ref >60)
GFR calc non Af Amer: >60 mL/min (ref >60)
Glucose, Bld: 79 mg/dL (ref 65–99)
POTASSIUM: 4.4 mmol/L (ref 3.5–5.1)
Sodium: 141 mmol/L (ref 135–145)

## 2015-06-23 LAB — FOLATE: Folate: 9.2 ng/mL (ref >5.9)

## 2015-06-23 LAB — CBC
HEMATOCRIT: 27.2 % — AB (ref 36.0–46.0)
Hemoglobin: 9 g/dL — ABNORMAL LOW (ref 12.0–15.0)
MCH: 33.8 pg (ref 26.0–34.0)
MCHC: 33.1 g/dL (ref 30.0–36.0)
MCV: 102.3 fL — AB (ref 78.0–100.0)
PLATELETS: 228 10*3/uL (ref 150–400)
RBC: 2.66 MIL/uL — AB (ref 3.87–5.11)
RDW: 14.3 % (ref 11.5–15.5)
WBC: 6.3 10*3/uL (ref 4.0–10.5)

## 2015-06-23 LAB — VITAMIN B12: Vitamin B-12: 888 pg/mL (ref 180–914)

## 2015-06-23 LAB — RETICULOCYTES
RBC.: 2.67 MIL/uL — ABNORMAL LOW (ref 3.87–5.11)
RETIC COUNT ABSOLUTE: 88.1 10*3/uL (ref 19.0–186.0)
RETIC CT PCT: 3.3 % — AB (ref 0.4–3.1)

## 2015-06-23 LAB — FERRITIN: FERRITIN: 294 ng/mL (ref 11–307)

## 2015-06-23 MED ORDER — SODIUM CHLORIDE 0.9 % IV BOLUS (SEPSIS)
500.0000 mL | Freq: Once | INTRAVENOUS | Status: AC
  Administered 2015-06-23: 500 mL via INTRAVENOUS

## 2015-06-23 MED ORDER — METOPROLOL TARTRATE 1 MG/ML IV SOLN: 5.0000 mg | INTRAVENOUS | Status: DC | PRN

## 2015-06-23 NOTE — Evaluation (Signed)
Physical Therapy Evaluation Patient Details Name: Sheryl Horn MRN: [id number] DOB: 01/07/1946 Today's Date: 06/23/2015   History of Present Illness  69 y.o. female with Atrial Fibrillation on Eliquis, HTN, and Dementia who was sent from her Assisted Living Center to the ED due to increased confusion, and she fell out of her chair and hit her head (CT head: No acute or posttraumatic deformity).  Pt admitted with altered mental status and HCAP  Clinical Impression  Pt admitted with above diagnosis. Pt currently with functional limitations due to the deficits listed below (see PT Problem List).  Pt will benefit from skilled PT to increase their independence and safety with mobility to allow discharge to the venue listed below.  Pt attempting to assist with mobility however does require multimodal cues and increased time to process.  Pt assisted OOB to recliner today.  Recommend ST-SNF upon d/c.  Pt also with bleeding skin tear to R elbow area and RN notified.     Follow Up Recommendations SNF;Supervision/Assistance - 24 hour    Equipment Recommendations  None recommended by PT    Recommendations for Other Services       Precautions / Restrictions Precautions Precautions: Fall      Mobility  Bed Mobility Overal bed mobility: Needs Assistance Bed Mobility: Supine to Sit     Supine to sit: Mod assist;HOB elevated     General bed mobility comments: multimodal cues for technique, pt requiring less assist once she understood task; pt does require increased time to process  Transfers Overall transfer level: Needs assistance Equipment used: Rolling walker (2 wheeled) Transfers: Sit to/from [employer] CorporationStand;Stand Pivot Transfers Sit to Stand: Mod assist Stand pivot transfers: Mod assist       General transfer comment: multimodal cues for safe technique, pt appears to be attempting to assist however does require increased time and occasionally manually cues to initiate/start    Ambulation/Gait                Stairs            Wheelchair Mobility    Modified Rankin (Stroke Patients Only)       Balance Overall balance assessment: History of Falls                                           Pertinent Vitals/Pain Pain Assessment: Faces Faces Pain Scale: No hurt    Home Living Family/patient expects to be discharged to:: Assisted living                      Prior Function           Comments: pt poor historian, per chart review increased falls and decreased mobility prior to admission     Hand Dominance        Extremity/Trunk Assessment   Upper Extremity Assessment: Generalized weakness;Difficult to assess due to impaired cognition           Lower Extremity Assessment: Generalized weakness;Difficult to assess due to impaired cognition         Communication   Communication: No difficulties  Cognition Arousal/Alertness: Awake/alert Behavior During Therapy: WFL for tasks assessed/performed Overall Cognitive Status: No family/caregiver present to determine baseline cognitive functioning                 General Comments: pt able to follow simple commands with increased  cues and time    General Comments      Exercises        Assessment/Plan    PT Assessment Patient needs continued PT services  PT Diagnosis Difficulty walking;Generalized weakness   PT Problem List Decreased strength;Decreased activity tolerance;Decreased mobility;Decreased skin integrity;Decreased safety awareness;Decreased knowledge of use of DME;Decreased cognition  PT Treatment Interventions DME instruction;Gait training;Functional mobility training;Patient/family education;Therapeutic exercise;Therapeutic activities   PT Goals (Current goals can be found in the Care Plan section) Acute Rehab PT Goals PT Goal Formulation: Patient unable to participate in goal setting Time For Goal Achievement:  07/07/15 Potential to Achieve Goals: Good    Frequency Min 3X/week   Barriers to discharge        Co-evaluation               End of Session Equipment Utilized During Treatment: Gait belt Activity Tolerance: Patient tolerated treatment well Patient left: in chair;with call bell/phone within reach Nurse Communication: Mobility status (left off O2 [place] however connected to continuous pulse ox)         Time: 1610-9604 PT Time Calculation (min) (ACUTE ONLY): 20 min   Charges:   PT Evaluation $Initial PT Evaluation Tier I: 1 Procedure     PT G Codes:        Julien Berryman,KATHrine E 06/23/2015, 12:49 PM Zenovia Jarred, PT, DPT 06/23/2015 Pager: [phone]

## 2015-06-23 NOTE — Clinical Social Work Placement (Signed)
   CLINICAL SOCIAL WORK PLACEMENT  NOTE  Date:  06/23/2015  Patient Details  Name: Kathi DerBetty Lunz MRN: [id number] Date of Birth: 03/18/1946  Clinical Social Work is seeking post-discharge placement for this patient at the Skilled  Nursing Facility level of care (*CSW will initial, date and re-position this form in  chart as items are completed):  Yes   Patient/family provided with [facility] Clinical Social Work Department's list of facilities offering this level of care within the geographic area requested by the patient (or if unable, by the patient's family).  Yes   Patient/family informed of their freedom to choose among providers that offer the needed level of care, that participate in Medicare, Medicaid or managed care program needed by the patient, have an available bed and are willing to accept the patient.  Yes   Patient/family informed of '[facility] ownership interest [facility] and [facility], as well as of the fact that they are under no obligation to receive care at these facilities.  PASRR submitted to EDS on 06/23/15     PASRR number received on       Existing PASRR number confirmed on       FL2 transmitted to all facilities in geographic area requested by pt/family on 06/23/15     FL2 transmitted to all facilities within larger geographic area on       Patient informed that his/her managed care company has contracts with or will negotiate with certain facilities, including the following:            Patient/family informed of bed offers received.  Patient chooses bed at       Physician recommends and patient chooses bed at      Patient to be transferred to   on  .  Patient to be transferred to facility by       Patient family notified on   of transfer.  Name of family member notified:        PHYSICIAN Please sign FL2     Additional Comment:    _______________________________________________ Orson EvaKIDD, Zayanna Pundt A, LCSW 06/23/2015, 2:26 PM

## 2015-06-23 NOTE — Progress Notes (Signed)
Initial Nutrition Assessment  DOCUMENTATION CODES:   Non-severe (moderate) malnutrition in context of acute illness/injury  INTERVENTION:  - Diet advancement as medically feasible - RD will continue to monitor for needs, especially need for supplements with diet advancement  NUTRITION DIAGNOSIS:   Inadequate oral intake related to inability to eat as evidenced by NPO status.  GOAL:   Patient will meet greater than or equal to 90% of their needs  MONITOR:   Diet advancement, Weight trends, Labs, Skin, I & O's  REASON FOR ASSESSMENT:   Malnutrition Screening Tool  ASSESSMENT:   69 y.o. female with Atrial Fibrillation on Eliquis Rx, HTN, and Dementia who was sent from her Assisted Living Center to the ED due to increased confusion, and she fell out of her chair and hit her head but was without injury. She had not LOC. A chest X-ray revealed findings of a Bilateral Airspace Opacities and RLL pneumonia suggestive of possible aspiration pneumonia. She had a recent hospitalization for CAP and now had been placed on antibiotic rx for HCAP. She was seen in the ED and had a CT Scan of her head which was negative for acute findings and she was referred for admission.   Pt seen for MST. BMI indicates normal weight status. Pt with hx of dementia and no family/visitors present at time of RD visit. Pt has been NPO since admission and is unable to meet needs; will monitor for ability for diet to be advanced.   Physical assessment shows moderate muscle wasting to upper body with no other muscle or fat wasting present at this time. Per chart review, pt has lost 8 lbs (6% body weight) in 13 days which is significant for time frame. Will need to continue to monitor weight trends as pt is on Lasix and weight loss may be associated with fluid weight.  Medications reviewed. Labs reviewed; K: 2.9 mmol/L, BUN/creatinine elevated but trending down, Ca: 7.2 mg/dL, GFR: 32.   Diet Order:  Diet  NPO time specified Except for: Sips with Meds  Skin:  Reviewed, no issues  Last BM:  PTA  Height:   Ht Readings from Last 1 Encounters:  06/23/15 5\' 2"  (1.575 m)    Weight:   Wt Readings from Last 1 Encounters:  06/23/15 125 lb (56.7 kg)    Ideal Body Weight:  50 kg (kg)  BMI:  Body mass index is 22.86 kg/(m^2).  Estimated Nutritional Needs:   Kcal:  1300-1500  Protein:  50-60 grams  Fluid:  1.8-2 L/day  EDUCATION NEEDS:   No education needs identified at this time     Trenton GammonJessica Vasiliy Mccarry, RD, LDN Inpatient Clinical Dietitian Pager # [phone] After hours/weekend pager # [phone]

## 2015-06-23 NOTE — Progress Notes (Signed)
Patient ID: Elorah Dewing, female   DOB: 09-15-1945, 69 y.o.   MRN: [id number] [facility] PROGRESS NOTE  Geanine Vandekamp VQM:[id number] DOB: 07/11/1946 DOA: 06/22/2015 PCP: PROVIDER NOT IN SYSTEM   Brief narrative:    69 y.o. female with Atrial Fibrillation on Eliquis Rx, HTN, and Dementia who was sent from her Assisted Living Center to the ED due to increased confusion, and she fell out of her chair and hit her head but was without injury. She had not LOC. A chest X-ray revealed findings of a Bilateral Airspace Opacities and RLL pneumonia suggestive of possible aspiration pneumonia. She had a recent hospitalization for CAP.  Assessment/Plan:    Principal Problem:   Acute encephalopathy - appears to be multifactorial and secondary to progressive failure to thrive HCAP, ? Aspiration - continue broad spectrum ABX for now and monitor clinical response - will need PT/OT/SLP evaluation   Active Problems:   HCAP (healthcare-associated pneumonia), RLL ? RLL - keep on Vanc and Fortaz, day #2 and narrow down as clinically indicated     Atrial fibrillation (HCC) - rate controlled, continue Eliquis    HTN (hypertension) - reasonable inpatient control     Dementia - will need to reassess in AM as pt is still somnolent this AM     Malnutrition of moderate degree - advance diet as pt able to tolerate   DVT prophylaxis - pt on Eliquis   Code Status: Full.  Family Communication:  No family at bedside  Disposition Plan: To be determined, once off ABX IV  IV access:  Peripheral IV  Procedures and diagnostic studies:    Dg Chest 2 View  06/22/2015  CLINICAL DATA:  Altered mental status with shortness of breath today. History of dementia. Initial encounter. EXAM: CHEST  2 VIEW COMPARISON:  06/06/2015 and 06/10/2015. FINDINGS: The heart size and mediastinal contours are stable with aortic atherosclerosis. Generalized interstitial prominence appears chronic and unchanged. There are  superimposed bibasilar airspace opacities with more density on right compared with the most recent study. There is no significant pleural effusion. The bones appear unchanged IMPRESSION: Bibasilar airspace opacities with interval worsening on the right, suspicious for aspiration pneumonia. Electronically Signed   By: Carey Bullocks M.D.   On: 06/22/2015 18:51   Dg Chest 2 View  06/10/2015  CLINICAL DATA:  [facility] EMS [facility]; per staff pt "has increased emotional agitation; going back and forth; screaming and crying" onset this morning. Staff report pt usually calm; hx of dementia (alert to self ONLY). Elevated WBC EXAM: CHEST  2 VIEW COMPARISON:  06/06/2015 FINDINGS: Compare is airspace consolidation in the left lower lobe posteriorly and medially, new from the prior exam. Remainder the lungs show prominent bronchovascular markings but are otherwise clear. No pleural effusion. No pneumothorax. Cardiac silhouette is mildly enlarged. No mediastinal or hilar masses or evidence of adenopathy. Bony thorax is demineralized but grossly intact. IMPRESSION: Left lower lobe pneumonia. Electronically Signed   By: Amie Portland M.D.   On: 06/10/2015 18:15   Dg Chest 2 View  06/06/2015  CLINICAL DATA:  Mental status changes.  Dementia up. EXAM: CHEST  2 VIEW COMPARISON:  None. FINDINGS: Heart size is normal. The aorta shows calcification and unfolding. There are abnormal interstitial markings in the lungs most consistent with chronic scarring. No evidence of consolidation or collapse. No effusions. Chronic degenerative changes affect the spine. IMPRESSION: No active disease suspected. Atherosclerosis of the aorta. Pulmonary fibrosis. Electronically Signed  By: Paulina Fusi M.D.   On: 06/06/2015 11:53   Ct Head Wo Contrast  06/20/2015  CLINICAL DATA:  Fall.  On blood thinners. EXAM: CT HEAD WITHOUT CONTRAST TECHNIQUE: Contiguous axial images were obtained from the base of the  skull through the vertex without intravenous contrast. COMPARISON:  06/10/2015 FINDINGS: Sinuses/Soft tissues: No significant soft tissue swelling. Clear paranasal sinuses and mastoid air cells. Intracranial: Mildly advanced cerebral atrophy for age. Cerebellar atrophy is also age advanced. Vertebral and carotid atherosclerosis bilaterally. Moderate low density in the periventricular white matter likely related to small vessel disease. IMPRESSION: 1. No acute or posttraumatic deformity. 2.  Cerebral/cerebellar atrophy and small vessel ischemic change. Electronically Signed   By: Jeronimo Greaves M.D.   On: 06/20/2015 16:28   Ct Head Wo Contrast  06/10/2015  CLINICAL DATA:  Altered mental status.  History of dementia. EXAM: CT HEAD WITHOUT CONTRAST TECHNIQUE: Contiguous axial images were obtained from the base of the skull through the vertex without intravenous contrast. COMPARISON:  05/27/2015. FINDINGS: Diffusely enlarged ventricles and subarachnoid spaces. Patchy white matter low density in both cerebral hemispheres. No intracranial hemorrhage, mass lesion or CT evidence of acute infarction. Small right sphenoid sinus retention cyst. IMPRESSION: 1. No acute abnormality. 2. Stable atrophy and chronic small vessel white matter ischemic changes. Electronically Signed   By: Beckie Salts M.D.   On: 06/10/2015 17:54   Ct Head Wo Contrast  06/06/2015  CLINICAL DATA:  Change in mental status.  History of dementia. EXAM: CT HEAD WITHOUT CONTRAST TECHNIQUE: Contiguous axial images were obtained from the base of the skull through the vertex without intravenous contrast. COMPARISON:  Prior study at [facility] on 02/22/2003 FINDINGS: Significant progression of generalized atrophy and moderately advanced small vessel disease in the periventricular white matter since the prior study. The brain demonstrates no evidence of hemorrhage, infarction, edema, mass effect, extra-axial fluid collection, hydrocephalus or mass  lesion. The skull is unremarkable. IMPRESSION: Significant progression of atrophy and small vessel disease since the prior study in 2004. No acute findings. Electronically Signed   By: Irish Lack M.D.   On: 06/06/2015 12:10    Medical Consultants:  None  Other Consultants:  PT/OT/SLP  IAnti-Infectives:   Clement Husbands and Elita Quick 11/16 -->  Debbora Presto, MD  [facility] Pager [phone]  If 7PM-7AM, please contact night-coverage [email or web address] Password [email or web address] 06/23/2015, 12:30 PM   LOS: 1 day   HPI/Subjective: No events overnight.   Objective: Filed Vitals:   06/23/15 0353 06/23/15 0541 06/23/15 0612 06/23/15 1020  BP: 101/43 122/54 105/38 126/75  Pulse: 77 70 68 79  Temp:  97.1 F (36.2 C)  97.6 F (36.4 C)  TempSrc:  Axillary  Oral  Resp:  18  18  Height:      Weight:      SpO2:  100% 98% 98%    Intake/Output Summary (Last 24 hours) at 06/23/15 1230 Last data filed at 06/23/15 0830  Gross per 24 hour  Intake      0 ml  Output     15 ml  Net    -15 ml    Exam:   General:  Pt is somnolent, NAD  Cardiovascular: Irregular rate and rhythm,  no rubs, no gallops  Respiratory: Clear to auscultation bilaterally, rhonchi at bases   Abdomen: Soft, non tender, non distended, bowel sounds present, no guarding  Extremities: pulses DP and PT palpable bilaterally  Data Reviewed: Basic Metabolic Panel:  Recent Labs Lab 06/22/15 1721 06/23/15  0535  NA 139 141  K 4.2 4.4  CL 102 107  CO2 29 29  GLUCOSE 109* 79  BUN 27* 22*  CREATININE 1.00 0.72  CALCIUM 8.9 8.3*   Liver Function Tests:  Recent Labs Lab 06/22/15 1721  AST 22  ALT 15  ALKPHOS 70  BILITOT 1.3*  PROT 5.2*  ALBUMIN 2.6*   CBC:  Recent Labs Lab 06/22/15 1721 06/23/15 0535  WBC 9.3 6.3  NEUTROABS 6.3  --   HGB 9.7* 9.0*  HCT 28.9* 27.2*  MCV 100.3* 102.3*  PLT 247 228   \CBG:  Recent Labs Lab 06/22/15 1728  GLUCAP 92    Recent Results (from the past 240 hour(s))  MRSA PCR  Screening     Status: None   Collection Time: 06/22/15  9:27 PM  Result Value Ref Range Status   MRSA by PCR NEGATIVE NEGATIVE Final     Scheduled Meds: . apixaban  5 mg Oral BID  . cefTAZidime (FORTAZ)  IV  1 g Intravenous Q12H  . digoxin  0.25 mg Oral Daily  . divalproex  500 mg Oral BID  . furosemide  20 mg Oral Daily  . lisinopril  20 mg Oral Daily  . metoprolol succinate  50 mg Oral Daily  . metronidazole  500 mg Intravenous 3 times per day  . risperiDONE  1 mg Oral BID  . vancomycin  500 mg Intravenous Q12H   Continuous Infusions: . sodium chloride 50 mL/hr at 06/22/15 2156

## 2015-06-23 NOTE — Clinical Social Work Note (Signed)
Clinical Social Work Assessment  Patient Details  Name: Kathi DerBetty Kempa MRN: [id number] Date of Birth: 08/03/1946  Date of referral:  06/23/15               Reason for consult:  Discharge Planning                Permission sought to share information with:  Family Supports Permission granted to share information::  Yes, Verbal Permission Granted  Name::     Betti CruzRoy [place]  Agency::     Relationship::  Brother  Contact Information:  [phone]  Housing/Transportation Living arrangements for the past 2 months:  Assisted Living Facility Source of Information:  Other (Comment Required) (patient brother) Patient Interpreter Needed:  None Criminal Activity/Legal Involvement Pertinent to Current Situation/Hospitalization:  No - Comment as needed Significant Relationships:  Siblings Lives with:  Facility Resident Do you feel safe going back to the place where you live?  No Need for family participation in patient care:  Yes (Comment)  Care giving concerns:  [facility]. Pt brother, Channing MuttersRoy, expressed concern about pts living situation at [facility].   Social Worker assessment / plan:  CSW received referral that pt admitted from [facility]. CSW reviewed chart and noted PT recommending SNF upon discharge. Per report, pt brother requesting to speak with CSW surrounding concerns at ALF.   CSW contacted pt brother, Channing MuttersRoy via telephone. CSW introduced self and explained role. Pt brother reports that when patient went to the ALF, she was able to perform all ADLs on her own. Per pt brother, pt mentation declined recently, her brother states "She is in a zombie state of mind", "She was so dazed that she could not make eye contact with me" when he was at the facility visiting pt on Tuesday. Per chart, pt brother stated to RN that, "I didn't know they were medicating her to keep her calm, and I think they are over-doing it". Pt brother reports that her  teeth and glasses have been lost at the facility. Pt brother stated that supervisor at the facility reports pt has been falling a lot which resulted in bruising all over her body. Pt brother stated that supervisor at facility mentioned involving hospice, but then stated it was not needed which was confusing to pt brother. Pt brother very concerned about facility and wanting things looked into. CSW explained that pt brother would need to contact the [facility] as they investigate concerns at facilities. CSW provided pt brother with contact information for [facility]. Pt brother expressed that he feels that facility should replace pt glasses and dentures as they were lost at the facility and CSW discussed with pt brother that pt brother would need to speak with administrator or director at the ALF regarding their policy surrounding replacing lost items.   CSW discussed with pt brother planning for disposition from the hospital. CSW explained to pt brother that PT evaluated pt and [facility] in order for pt to get stronger. CSW explained that SNF can assist pt brother in locating a new ALF if pt progresses enough to return to ALF level of care or pt may need to stay long term at SNF level. CSW explained that it will depend on pt progress. CSW explained to pt brother that with pt having Medicaid that long term is an option either in ALF or SNF. Pt brother feels that  SNF is best plan at this time and agreeable to [facility] search. Pt brother stated that it will be best to provide he and pt sister with bed offers because pt sister lives in [place] and will be more familiar with facilities.   CSW completed FL2 and initiated SNF search to [facility]. CSW submitted pt pasarr and pasarr being manually reviewed. CSW will follow up on SNF pasarr.  CSW to follow up with pt brother and pt sister tomorrow with SNF bed  offers.  CSW to continue to follow to provide support and assist with pt disposition needs.    Employment status:  Retired Health and safety inspector:  Armed forces operational officer, Medicaid In [place] PT Recommendations:  Skilled Nursing Facility Information / Referral to community resources:  Skilled Nursing Facility  Patient/Family's Response to care:  Pt alert and oriented to person only. Pt has a history of dementia. Pt brother expressed concern surrounding the ALF that pt admitted from. Pt brother agreeable to plan for rehab at [facility] following hospitalization.  Patient/Family's Understanding of and Emotional Response to Diagnosis, Current Treatment, and Prognosis:  Pt brother displayed knowledge surrounding pt medical diagnosis and treatment plan. Pt brother expressed frustration on how pt medical needs were being managed while pt at ALF, but stated that he noticed a difference in pt mental status with pt admission to the hospital.   Emotional Assessment Appearance:    Attitude/Demeanor/Rapport:  Unable to Assess Affect (typically observed):  Unable to Assess Orientation:  Oriented to Self Alcohol / Substance use:  Not Applicable Psych involvement (Current and /or in the community):  No (Comment)  Discharge Needs  Concerns to be addressed:  Discharge Planning Concerns Readmission within the last 30 days:    Current discharge risk:  Physical Impairment Barriers to Discharge:  Continued Medical Work up   [name] A, LCSW 06/23/2015, 2:51 PM Coverage for 5E [phone]

## 2015-06-23 NOTE — Progress Notes (Signed)
Patient having an episode of hypotension. BP at 0110 94/42. A 500 ml bolus was given, BP at 0200 84/49. Another 500 mL bolus given, BP at 0259 99/32. After a third 500 mL fluid bolus, BP at 0353 101/43. MD made aware of most recent pressure, will continue to monitor. Patient still arouseable and able to follow simple commands with frequent cuing, no new alterations in mental status.

## 2015-06-23 NOTE — NC FL2 (Signed)
[place] MEDICAID FL2 LEVEL OF CARE SCREENING TOOL     IDENTIFICATION  Patient Name: Sheryl Horn Birthdate: 04/13/1946 Sex: female Admission Date (Current Location): 06/22/2015  [facility] [place]     Facility and Address:         Provider Number:    Attending Physician Name and Address:  Dorothea OgleIskra M Myers, MD  Relative Name and Phone Number:       Current Level of Care: Hospital Recommended Level of Care: Skilled Nursing Facility Prior Approval Number:    Date Approved/Denied:   PASRR Number: Re-submitted pasarr fo SNF  Discharge Plan: SNF    Current Diagnoses: Patient Active Problem List   Diagnosis Date Noted  . Acute encephalopathy 06/23/2015  . Malnutrition of moderate degree 06/23/2015  . HCAP (healthcare-associated pneumonia) 06/10/2015  . Atrial fibrillation (HCC) 06/10/2015  . HTN (hypertension) 06/10/2015  . Dementia 06/10/2015  . Anxiety 06/10/2015    Orientation ACTIVITIES/SOCIAL BLADDER RESPIRATION    Self  Active      BEHAVIORAL SYMPTOMS/MOOD NEUROLOGICAL BOWEL NUTRITION STATUS      Incontinent Diet (NPO, will update as advanced)  PHYSICIAN VISITS COMMUNICATION OF NEEDS Height & Weight Skin        125 lbs. Bruising, Skin abrasions          AMBULATORY STATUS RESPIRATION    Assist extensive        Personal Care Assistance Level of Assistance               Functional Limitations Info    Sight Info: Impaired           SPECIAL CARE FACTORS FREQUENCY  PT (By licensed PT), OT (By licensed OT)     PT Frequency: 5x week OT Frequency: 5x week           Additional Factors Info  Code Status, Allergies Code Status Info: FULL code status Allergies Info: Bacitracin, Other- HMG-COA reductase inhibitors - reaction unknown per MAR , Penicillins, Statins           Current Medications (06/23/2015): Current Facility-Administered Medications  Medication Dose Route Frequency Provider Last Rate Last Dose  . 0.9 %  sodium  chloride infusion   Intravenous Continuous Ron ParkerHarvette C Jenkins, MD 50 mL/hr at 06/22/15 2156    . acetaminophen (TYLENOL) tablet 650 mg  650 mg Oral Q6H PRN Ron ParkerHarvette C Jenkins, MD       Or  . acetaminophen (TYLENOL) suppository 650 mg  650 mg Rectal Q6H PRN Ron ParkerHarvette C Jenkins, MD      . alum & mag hydroxide-simeth (MAALOX/MYLANTA) 200-200-20 MG/5ML suspension 30 mL  30 mL Oral Q6H PRN Ron ParkerHarvette C Jenkins, MD      . apixaban (ELIQUIS) tablet 5 mg  5 mg Oral BID Ron ParkerHarvette C Jenkins, MD   5 mg at 06/23/15 0747  . cefTAZidime (FORTAZ) 1 g in dextrose 5 % 50 mL IVPB  1 g Intravenous Q12H Benjiman CoreNathan Pickering, MD   1 g at 06/23/15 0937  . digoxin (LANOXIN) tablet 0.25 mg  0.25 mg Oral Daily Ron ParkerHarvette C Jenkins, MD   0.25 mg at 06/23/15 1029  . divalproex (DEPAKOTE) DR tablet 500 mg  500 mg Oral BID Ron ParkerHarvette C Jenkins, MD   500 mg at 06/23/15 1029  . furosemide (LASIX) tablet 20 mg  20 mg Oral Daily Ron ParkerHarvette C Jenkins, MD   20 mg at 06/23/15 1028  . HYDROmorphone (DILAUDID) injection 0.5-1 mg  0.5-1 mg Intravenous Q3H PRN Harvette Velora Heckler Jenkins,  MD      . lisinopril (PRINIVIL,ZESTRIL) tablet 20 mg  20 mg Oral Daily Ron Parker, MD   20 mg at 06/23/15 1028  . metoprolol succinate (TOPROL-XL) 24 hr tablet 50 mg  50 mg Oral Daily Ron Parker, MD   50 mg at 06/23/15 1028  . metroNIDAZOLE (FLAGYL) IVPB 500 mg  500 mg Intravenous 3 times per day Joycie Peek, PA-C 100 mL/hr at 06/23/15 0602 500 mg at 06/23/15 0602  . ondansetron (ZOFRAN) tablet 4 mg  4 mg Oral Q6H PRN Ron Parker, MD       Or  . ondansetron (ZOFRAN) injection 4 mg  4 mg Intravenous Q6H PRN Ron Parker, MD      . oxyCODONE (Oxy IR/ROXICODONE) immediate release tablet 5 mg  5 mg Oral Q4H PRN Ron Parker, MD   5 mg at 06/23/15 0134  . risperiDONE (RISPERDAL) tablet 1 mg  1 mg Oral BID Ron Parker, MD   1 mg at 06/23/15 1029  . vancomycin (VANCOCIN) 500 mg in sodium chloride 0.9 % 100 mL IVPB  500 mg Intravenous  Q12H Benjiman Core, MD   500 mg at 06/23/15 1033   Do not use this list as official medication orders. Please verify with discharge summary.  Discharge Medications:   Medication List    ASK your doctor about these medications        acetaminophen 325 MG tablet  Commonly known as:  TYLENOL  Take 650 mg by mouth every 6 (six) hours as needed for moderate pain.     citalopram 10 MG tablet  Commonly known as:  CELEXA  Take 10 mg by mouth daily.     digoxin 0.25 MG tablet  Commonly known as:  LANOXIN  Take 0.25 mg by mouth daily.     divalproex 500 MG DR tablet  Commonly known as:  DEPAKOTE  Take 500 mg by mouth 2 (two) times daily.     ELIQUIS 5 MG Tabs tablet  Generic drug:  apixaban  Take 5 mg by mouth 2 (two) times daily.     Fluticasone-Salmeterol 500-50 MCG/DOSE Aepb  Commonly known as:  ADVAIR  Inhale 1 puff into the lungs 2 (two) times daily.     furosemide 40 MG tablet  Commonly known as:  LASIX  Take 20 mg by mouth daily.     levofloxacin 500 MG tablet  Commonly known as:  LEVAQUIN  Take 1 tablet (500 mg total) by mouth daily. For 3days     LORazepam 0.5 MG tablet  Commonly known as:  ATIVAN  Take 0.5 mg by mouth at bedtime.     LORazepam 0.5 MG tablet  Commonly known as:  ATIVAN  Take 1 tablet (0.5 mg total) by mouth daily as needed for anxiety.     metoprolol succinate 25 MG 24 hr tablet  Commonly known as:  TOPROL-XL  Take 50 mg by mouth daily.     nicotine polacrilex 2 MG gum  Commonly known as:  NICORETTE  Take 2 mg by mouth as needed for smoking cessation.     quinapril 20 MG tablet  Commonly known as:  ACCUPRIL  Take 20 mg by mouth daily.     risperiDONE 0.25 MG tablet  Commonly known as:  RISPERDAL  Take 1 mg by mouth 2 (two) times daily.     risperiDONE 0.25 MG tablet  Commonly known as:  RISPERDAL  Take 0.125 mg by mouth 2 (  two) times daily as needed (For agitation.).     VENTOLIN HFA 108 (90 BASE) MCG/ACT inhaler  Generic drug:   albuterol  Inhale 2 puffs into the lungs every 4 (four) hours as needed for wheezing.        Relevant Imaging Results:  Relevant Lab Results:  Recent Labs    Additional Information    KIDD, SUZANNA A, LCSW

## 2015-06-23 NOTE — Care Management Note (Signed)
Case Management Note  Patient Details  Name: Sheryl Horn MRN: [id number] Date of Birth: 03/10/1946  Subjective/Objective:        Recurrent pna in an elderly patient/aspiration pna            Action/Plan:Date: June 23, 2015 Chart reviewed for concurrent status and case management needs. Will continue to follow patient for changes and needs: [name], RN, BSN, [place]   [phone]   Expected Discharge Date:   (unknown)               Expected Discharge Plan:  Home/Self Care  In-House Referral:  NA  Discharge planning Services  CM Consult  Post Acute Care Choice:  NA Choice offered to:  NA  DME Arranged:    DME Agency:     HH Arranged:    HH Agency:     Status of Service:  In process, will continue to follow  Medicare Important Message Given:    Date Medicare IM Given:    Medicare IM give by:    Date Additional Medicare IM Given:    Additional Medicare Important Message give by:     If discussed at Long Length of Stay Meetings, dates discussed:    Additional Comments:  [name], [name], RN 06/23/2015, 1:56 PM

## 2015-06-24 LAB — BASIC METABOLIC PANEL
ANION GAP: 6 (ref 5–15)
BUN: 15 mg/dL (ref 6–20)
CHLORIDE: 105 mmol/L (ref 101–111)
CO2: 29 mmol/L (ref 22–32)
CREATININE: 0.64 mg/dL (ref 0.44–1.00)
Calcium: 8.2 mg/dL — ABNORMAL LOW (ref 8.9–10.3)
GFR calc non Af Amer: >60 mL/min (ref >60)
Glucose, Bld: 68 mg/dL (ref 65–99)
POTASSIUM: 4.1 mmol/L (ref 3.5–5.1)
SODIUM: 140 mmol/L (ref 135–145)

## 2015-06-24 LAB — CBC
HEMATOCRIT: 26.2 % — AB (ref 36.0–46.0)
HEMOGLOBIN: 8.6 g/dL — AB (ref 12.0–15.0)
MCH: 33.5 pg (ref 26.0–34.0)
MCHC: 32.8 g/dL (ref 30.0–36.0)
MCV: 101.9 fL — ABNORMAL HIGH (ref 78.0–100.0)
Platelets: 247 10*3/uL (ref 150–400)
RBC: 2.57 MIL/uL — AB (ref 3.87–5.11)
RDW: 14.5 % (ref 11.5–15.5)
WBC: 4.7 10*3/uL (ref 4.0–10.5)

## 2015-06-24 MED ORDER — DEXTROSE 5 % IV SOLN
1.0000 g | Freq: Three times a day (TID) | INTRAVENOUS | Status: DC
  Administered 2015-06-24 – 2015-06-25 (×4): 1 g via INTRAVENOUS
  Filled 2015-06-24 (×4): qty 1

## 2015-06-24 NOTE — Clinical Social Work Note (Signed)
CSW extended bed offers to pt's sister [name].  Sister stated she shared the facilities with her brother and they are researching the facilities.  CSW will continue to follow and assist with d/c planning needs.  [place] Shore[place]ynn Chele [place], [place] [phone]
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
## 2015-06-24 NOTE — Progress Notes (Signed)
ANTIBIOTIC CONSULT NOTE - Follow-Up  Pharmacy Consult for Aspiration PNA Indication: Ceftazidime / Flagyl / Vancomycin   Allergies  Allergen Reactions  . Bacitracin Other (See Comments)    Reaction unknown per MAR  . Other Other (See Comments)    HMG-COA reductase inhibitors - reaction unknown per MAR  . Penicillins Other (See Comments)    Reaction unknown per MAR  . Statins Other (See Comments)    [facility]    Patient Measurements: Height: 5\' 2"  (157.5 cm) Weight: 125 lb (56.7 kg) IBW/kg (Calculated) : 50.1 Adjusted Body Weight:   Vital Signs: Temp: 97.9 F (36.6 C) (11/18 0429) Temp Source: Oral (11/18 0429) BP: 106/61 mmHg (11/18 0429) Pulse Rate: 72 (11/18 0429) Intake/Output from previous day: 11/17 0701 - 11/18 0700 In: 400 [I.V.:400] Out: -  Intake/Output from this shift:    Labs:  Recent Labs  06/22/15 1721 06/23/15 0535 06/24/15 0520  WBC 9.3 6.3 4.7  HGB 9.7* 9.0* 8.6*  PLT 247 228 247  CREATININE 1.00 0.72 0.64   Estimated Creatinine Clearance: 51 mL/min (by C-G formula based on Cr of 0.64). No results for input(s): VANCOTROUGH, VANCOPEAK, VANCORANDOM, GENTTROUGH, GENTPEAK, GENTRANDOM, TOBRATROUGH, TOBRAPEAK, TOBRARND, AMIKACINPEAK, AMIKACINTROU, AMIKACIN in the last 72 hours.   Microbiology: Recent Results (from the past 720 hour(s))  Blood culture (routine x 2)     Status: None   Collection Time: 06/10/15  7:00 PM  Result Value Ref Range Status   Specimen Description BLOOD RIGHT ANTECUBITAL  Final   Special Requests BOTTLES DRAWN AEROBIC AND ANAEROBIC 10CC EACH  Final   Culture   Final    NO GROWTH 5 DAYS Performed at [facility]    Report Status 06/15/2015 FINAL  Final  Blood culture (routine x 2)     Status: None   Collection Time: 06/10/15  7:20 PM  Result Value Ref Range Status   Specimen Description BLOOD LEFT ANTECUBITAL  Final   Special Requests BOTTLES DRAWN AEROBIC AND ANAEROBIC 5CC EACH  Final   Culture    Final    NO GROWTH 5 DAYS [facility]formed at [facility]    Report Status 06/15/2015 FINAL  Final  MRSA PCR Screening     Status: None   Collection Time: 06/22/15  9:27 PM  Result Value Ref Range Status   MRSA by PCR NEGATIVE NEGATIVE Final    Comment:        The GeneXpert MRSA Assay (FDA approved for NASAL specimens only), is one component of a comprehensive MRSA colonization surveillance program. It is not intended to diagnose MRSA infection nor to guide or monitor treatment for MRSA infections.   Culture, blood (routine x 2) Call MD if unable to obtain prior to antibiotics being given     Status: None (Preliminary result)   Collection Time: 06/22/15 10:00 PM  Result Value Ref Range Status   Specimen Description BLOOD LEFT HAND  Final   Special Requests BOTTLES DRAWN AEROBIC ONLY 5ML  Final   Culture   Final    NO GROWTH < 24 HOURS Performed at [facility]    Report Status PENDING  Incomplete  Culture, blood (routine x 2) Call MD if unable to obtain prior to antibiotics being given     Status: None (Preliminary result)   Collection Time: 06/22/15 10:05 PM  Result Value Ref Range Status   Specimen Description BLOOD LEFT ANTECUBITAL  Final   Special Requests BOTTLES DRAWN AEROBIC AND ANAEROBIC 5ML  Final   Culture   Final    NO GROWTH < 24 HOURS Performed at [facility]    Report Status PENDING  Incomplete    Medical History: Past Medical History  Diagnosis Date  . Anxiety   . A-fib (HCC)   . Dementia   . Depression   . Hypertension    Assessment: 59 yoF with PMHx atrial fibrillation on chronic apixaban, dementia, bipolar disorder, and HTN and recent hospitalization for HCAP brought to ED from NH for AMS.  Pt also seen in ED 11/14 after fall and discharged home after negative work-up. Chest xray shows bibasilar airspace opacities suspicious for aspiration PNA.  Pharmacy consulted to start ceftazidime and vancomycin, MD dosing flagyl. Allergy  noted to penicillins (reaction unknown).  Pt has tolerated ceftazidime in the past.   Anti-infectives 11/16 >> Ceftazidime  >> 11/16 >> Flagyl  >>   11/16 >> Vancomycin >>  Vitals/Labs WBC: WNL Tm24h: afebrile Renal: SCr 0.64 (~0.5 last 11/5-11/7), CrCl 57 ( using SCr estimate of 0.8)  Cultures BC x2: NGP MRSA PCR negative    Goal of Therapy:  Eradication of infection Vancomycin trough 15-67mcg/ml  Plan:  -- Vancomycin  IV x1, then  IV q12h -- Continue flagyl  IV q8h -- Follow renal function, drug levels as needed, micro, clinical course -- Will transition patient to ceftazidime 1 gr IV q8h due to improving renal function  Adalberto Cole, PharmD, BCPS Pager [phone] 06/24/2015 8:39 AM

## 2015-06-24 NOTE — Evaluation (Signed)
Clinical/Bedside Swallow Evaluation Patient Details  Name: Sheryl Horn MRN: [id number] Date of Birth: 12/24/1945  Today's Date: 06/24/2015 Time: SLP Start Time (ACUTE ONLY): 1220 SLP Stop Time (ACUTE ONLY): 1305 SLP Time Calculation (min) (ACUTE ONLY): 45 min  Past Medical History:  Past Medical History  Diagnosis Date  . Anxiety   . A-fib (HCC)   . Dementia   . Depression   . Hypertension    Past Surgical History: History reviewed. No pertinent past surgical history. HPI:  69 yo [facility], diagnosed with acute encephalopathy - found to have bilateral pna, right more than left consistent with aspiration pna.  PMH + for dementia, HCAP, malnutrition, recent admit with left lobe pna.  Pt resides at facility.  CT showed cerebellar and cerebral atrophy.  Pt had been seen during prior hospitalization via bse = no symptoms of aspiration noted - oral holding with pills observed.  Recommend dys3/tihin.     Assessment / Plan / Recommendation Clinical Impression  Pt presents with mild oral dysphagia secondary to edentulous status - has only four lower teeth and per chart review dentures have been lost prior to admit.  Lack of dentures limitis effective mastication - but no oral residuals noted.  Today pt is fully alert and able to self feed.   Intake of water/juice/ applesauce/cracker tolerated well with timely swallow and clear voice throughout.    However upon consumption of icecream, pt noted to cough and clear her throat - ? secretions worsening with milkd laden products.    Recommend dypshagia 3 (mechanical soft) and thin liquids. Medicines crushed with puree (if large and not contraindicated) due to h/o pt difficulty with pills with some oral holding and mastication of whole pills. ST to follow up x1 for diet tolerance.  Pt denies dysphagia, reflux issues however cognitive deficits may impact historian accuracy.     SLP to follow up x1 due to recent admit with pna, ? If pt  aspirating secretions if she is overmedicated that may contribute to asp pnas.        Aspiration Risk  Mild aspiration risk    Diet Recommendation   dys3/thin ground meats  Medication Administration: Crushed with puree  Whole - if not contraindicated - if small whole    Other  Recommendations Oral Care Recommendations: Oral care BID   Follow up Recommendations    tbd    Frequency and Duration min 1 x/week  1 week       Swallow Study   General Date of Onset: 06/24/15 HPI: 69 [facility], diagnosed with acute encephalopathy - found to have bilateral pna, right more than left consistent with aspiration pna.  PMH + for dementia, HCAP, malnutrition, recent admit with left lobe pna.  Pt resides at facility.  CT showed cerebellar and cerebral atrophy.  Pt had been seen during prior hospitalization via bse = no symptoms of aspiration noted - oral holding with pills observed.  Recommend dys3/tihin.   Type of Study: Bedside Swallow Evaluation Diet Prior to this Study: NPO Temperature Spikes Noted: No Respiratory Status: Nasal cannula History of Recent Intubation: No Behavior/Cognition: Alert;Cooperative;Pleasant mood Oral Cavity Assessment: Within Functional Limits Oral Care Completed by SLP: No Oral Cavity - Dentition: Missing dentition (four lower teeth only, dentures reported to be lost and facility per chart review) Vision: Functional for self-feeding Self-Feeding Abilities: Able to feed self Patient Positioning: Upright in bed Baseline Vocal Quality: Hoarse (voice appears mildly  hoarse) Volitional Cough: Strong Volitional Swallow: Able to elicit    Oral/Motor/Sensory Function Overall Oral Motor/Sensory Function: Within functional limits   Ice Chips Ice chips: Not tested   Thin Liquid Thin Liquid: Within functional limits Presentation: Cup;Straw;Self Fed    Nectar Thick Nectar Thick Liquid: Not tested   Honey Thick Honey Thick Liquid: Not tested   Puree  Puree: Within functional limits Presentation: Self Fed;Spoon   Solid Solid: Impaired Presentation: Self Fed Oral Phase Impairments: Reduced lingual movement/coordination Oral Phase Functional Implications: Impaired mastication Other Comments: likely due to pt's nearly edentulous status       Donavan Burnet, MS [facility] SLP [phone]

## 2015-06-24 NOTE — Care Management Important Message (Signed)
Important Message  Patient Details  Name: Kathi DerBetty Dyke MRN: [id number] Date of Birth: 11/08/1945   Medicare Important Message Given:  Yes    Renie OraHawkins, Annalia Metzger Smith 06/24/2015, 12:57 PMImportant Message  Patient Details  Name: Kathi DerBetty Malachi MRN: [id number] Date of Birth: 07/21/1946   Medicare Important Message Given:  Yes    Renie OraHawkins, Jakya Dovidio Smith 06/24/2015, 12:56 PM

## 2015-06-24 NOTE — Evaluation (Signed)
Occupational Therapy Evaluation Patient Details Name: Sheryl Horn MRN: [id number] DOB: 03-15-1946 Today's Date: 06/24/2015    History of Present Illness 69 y.o. female with Atrial Fibrillation on Eliquis, HTN, and Dementia who was sent from her Assisted Living Center to the ED due to increased confusion, and she fell out of her chair and hit her head (CT head: No acute or posttraumatic deformity).  Pt admitted with altered mental status and HCAP   Clinical Impression   Pt was admitted for the above. Pt needed total A for ADLs recently (2 weeks prior to admission).  Prior to this, she participated in ADLs, toilet transfers, and mobility.  Will follow in acute setting with min A level goals for toileting and standing for adls.  Recommend continued OT at ALF  Follow Up Recommendations  Home health OT (at ALF and assistance for mobility/adls)    Equipment Recommendations  3 in 1 bedside comode (if pt doesn't have)    Recommendations for Other Services       Precautions / Restrictions Precautions Precautions: Fall Restrictions Weight Bearing Restrictions: No      Mobility Bed Mobility         Supine to sit: Mod assist;HOB elevated     General bed mobility comments: multimodal cues; guided legs and assisted trunk  Transfers                 General transfer comment: not performed:  pt declined    Balance Overall balance assessment: History of Falls                                          ADL Overall ADL's : Needs assistance/impaired     Grooming: Minimal assistance;Bed level (multimodal cues)   Upper Body Bathing: Moderate assistance;Sitting (multimodal cues)   Lower Body Bathing: Bed level;Maximal assistance   Upper Body Dressing : Maximal assistance;Sitting   Lower Body Dressing: Total assistance;Bed level       Toileting- Clothing Manipulation and Hygiene: Total assistance;Bed level         General ADL Comments: pt sat at EOB,  mod A to get there and min guard for safety.  Pt did not want to stand.  Repositioned in bed and rolled for hygiene as she was incontinent     Vision     Perception     Praxis      Pertinent Vitals/Pain Pain Assessment: Faces Faces Pain Scale: Hurts a little bit Pain Location: buttocks Pain Intervention(s): Repositioned;Monitored during session;Limited activity within patient's tolerance     Hand Dominance     Extremity/Trunk Assessment Upper Extremity Assessment Upper Extremity Assessment: Generalized weakness           Communication Communication Communication: No difficulties   Cognition Arousal/Alertness: Awake/alert Behavior During Therapy: WFL for tasks assessed/performed Overall Cognitive Status: History of cognitive impairments - at baseline                     General Comments       Exercises       Shoulder Instructions      Home Living Family/patient expects to be discharged to:: Assisted living  Prior Functioning/Environment Level of Independence: Needs assistance        Comments: manager of memory care at ALF was in the room.  Pt had a significant decline in mobility and ADLs over the past 2 weeks. They were doing everything for her    OT Diagnosis: Generalized weakness;Acute pain   OT Problem List: Decreased strength;Decreased activity tolerance;Impaired balance (sitting and/or standing);Decreased cognition;Decreased safety awareness;Pain   OT Treatment/Interventions: Self-care/ADL training;DME and/or AE instruction;Therapeutic activities;Cognitive remediation/compensation;Patient/family education;Balance training    OT Goals(Current goals can be found in the care plan section) Acute Rehab OT Goals Patient Stated Goal: manager of ALF hopes pt will get back to baseline Time For Goal Achievement: 07/01/15 Potential to Achieve Goals: Good ADL Goals Pt Will Transfer to Toilet:  with min assist;bedside commode;stand pivot transfer Additional ADL Goal #1: pt will go from sit to stand with min A and maintain for 2 minutes with min A for adls  OT Frequency: Min 2X/week   Barriers to D/C:            Co-evaluation              End of Session    Activity Tolerance: Patient tolerated treatment well Patient left: in bed;with call bell/phone within reach;with bed alarm set   Time: [id number] OT Time Calculation (min): 21 min Charges:  OT General Charges $OT Visit: 1 Procedure OT Evaluation $Initial OT Evaluation Tier I: 1 Procedure G-Codes:    Lannis Lichtenwalner 06/24/2015, 2:51 PM  Marica OtterMaryellen Tyson Masin, OTR/L [phone] 06/24/2015

## 2015-06-24 NOTE — Progress Notes (Signed)
Patient ID: Sheryl Horn, female   DOB: 01/15/1946, 69 y.o.   MRN: [id number] [facility] PROGRESS NOTE  Ruari Duggan NFA:[id number] DOB: December 03, 1945 DOA: 06/22/2015 PCP: PROVIDER NOT IN SYSTEM   Brief narrative:    69 y.o. female with Atrial Fibrillation on Eliquis Rx, HTN, and Dementia who was sent from her Assisted Living Center to the ED due to increased confusion, and she fell out of her chair and hit her head but was without injury. She had not LOC. A chest X-ray revealed findings of a Bilateral Airspace Opacities and RLL pneumonia suggestive of possible aspiration pneumonia. She had a recent hospitalization for CAP.  Assessment/Plan:    Principal Problem:   Acute encephalopathy - appears to be multifactorial and secondary to progressive failure to thrive HCAP, ? Aspiration imposed on advanced dementia  - pt has been on broad spectrum abx for aspiration PNA, she has ben afebrile and WBC is WNL - will need PT/OT/SLP evaluation  - once SLP done will plan on narrowing ABX   Active Problems:   HCAP (healthcare-associated pneumonia), RLL ? RLL - keep on Vanc and Fortaz, day #3 and narrow down once SLP done     Atrial fibrillation (HCC) - rate controlled, continue Eliquis and digoxin     HTN (hypertension) - low this AM, hold lasix and lisinopril     Dementia - advanced, more alert this AM    Malnutrition of moderate degree - advance diet as pt able to tolerate   DVT prophylaxis - pt on Eliquis   Code Status: Full.  Family Communication:  No family at bedside  Disposition Plan: To be determined, once off ABX IV  IV access:  Peripheral IV  Procedures and diagnostic studies:    Dg Chest 2 View  06/22/2015  CLINICAL DATA:  Altered mental status with shortness of breath today. History of dementia. Initial encounter. EXAM: CHEST  2 VIEW COMPARISON:  06/06/2015 and 06/10/2015. FINDINGS: The heart size and mediastinal contours are stable with aortic atherosclerosis.  Generalized interstitial prominence appears chronic and unchanged. There are superimposed bibasilar airspace opacities with more density on right compared with the most recent study. There is no significant pleural effusion. The bones appear unchanged IMPRESSION: Bibasilar airspace opacities with interval worsening on the right, suspicious for aspiration pneumonia. Electronically Signed   By: Carey Bullocks M.D.   On: 06/22/2015 18:51   Dg Chest 2 View  06/10/2015  CLINICAL DATA:  Per EMS pt [facility] for [facility]; per staff pt "has increased emotional agitation; going back and forth; screaming and crying" onset this morning. Staff report pt usually calm; hx of dementia (alert to self ONLY). Elevated WBC EXAM: CHEST  2 VIEW COMPARISON:  06/06/2015 FINDINGS: Compare is airspace consolidation in the left lower lobe posteriorly and medially, new from the prior exam. Remainder the lungs show prominent bronchovascular markings but are otherwise clear. No pleural effusion. No pneumothorax. Cardiac silhouette is mildly enlarged. No mediastinal or hilar masses or evidence of adenopathy. Bony thorax is demineralized but grossly intact. IMPRESSION: Left lower lobe pneumonia. Electronically Signed   By: Amie Portland M.D.   On: 06/10/2015 18:15   Dg Chest 2 View  06/06/2015  CLINICAL DATA:  Mental status changes.  Dementia up. EXAM: CHEST  2 VIEW COMPARISON:  None. FINDINGS: Heart size is normal. The aorta shows calcification and unfolding. There are abnormal interstitial markings in the lungs most consistent with chronic scarring. No evidence of consolidation or collapse. No effusions.  Chronic degenerative changes affect the spine. IMPRESSION: No active disease suspected. Atherosclerosis of the aorta. Pulmonary fibrosis. Electronically Signed   By: Paulina FusiMark  Shogry M.D.   On: 06/06/2015 11:53   Ct Head Wo Contrast  06/20/2015  CLINICAL DATA:  Fall.  On blood thinners. EXAM: CT HEAD WITHOUT  CONTRAST TECHNIQUE: Contiguous axial images were obtained from the base of the skull through the vertex without intravenous contrast. COMPARISON:  06/10/2015 FINDINGS: Sinuses/Soft tissues: No significant soft tissue swelling. Clear paranasal sinuses and mastoid air cells. Intracranial: Mildly advanced cerebral atrophy for age. Cerebellar atrophy is also age advanced. Vertebral and carotid atherosclerosis bilaterally. Moderate low density in the periventricular white matter likely related to small vessel disease. IMPRESSION: 1. No acute or posttraumatic deformity. 2.  Cerebral/cerebellar atrophy and small vessel ischemic change. Electronically Signed   By: Jeronimo GreavesKyle  Talbot M.D.   On: 06/20/2015 16:28   Ct Head Wo Contrast  06/10/2015  CLINICAL DATA:  Altered mental status.  History of dementia. EXAM: CT HEAD WITHOUT CONTRAST TECHNIQUE: Contiguous axial images were obtained from the base of the skull through the vertex without intravenous contrast. COMPARISON:  05/27/2015. FINDINGS: Diffusely enlarged ventricles and subarachnoid spaces. Patchy white matter low density in both cerebral hemispheres. No intracranial hemorrhage, mass lesion or CT evidence of acute infarction. Small right sphenoid sinus retention cyst. IMPRESSION: 1. No acute abnormality. 2. Stable atrophy and chronic small vessel white matter ischemic changes. Electronically Signed   By: Beckie SaltsSteven  Reid M.D.   On: 06/10/2015 17:54   Ct Head Wo Contrast  06/06/2015  CLINICAL DATA:  Change in mental status.  History of dementia. EXAM: CT HEAD WITHOUT CONTRAST TECHNIQUE: Contiguous axial images were obtained from the base of the skull through the vertex without intravenous contrast. COMPARISON:  [facility] FINDINGS: Significant progression of generalized atrophy and moderately advanced small vessel disease in the periventricular white matter since the prior study. The brain demonstrates no evidence of hemorrhage,  infarction, edema, mass effect, extra-axial fluid collection, hydrocephalus or mass lesion. The skull is unremarkable. IMPRESSION: Significant progression of atrophy and small vessel disease since the prior study in 2004. No acute findings. Electronically Signed   By: Irish LackGlenn  Yamagata M.D.   On: 06/06/2015 12:10    Medical Consultants:  None  Other Consultants:  PT/OT/SLP  IAnti-Infectives:   Clement HusbandsVanc and Elita QuickFortaz 11/16 -->  Debbora PrestoMAGICK-Janathan Bribiesca, MD  [facility] Pager [phone]  If 7PM-7AM, please contact night-coverage [email or web address] Password [email or web address] 06/24/2015, 11:37 AM   LOS: 2 days   HPI/Subjective: No events overnight.   Objective: Filed Vitals:   06/23/15 2120 06/24/15 0219 06/24/15 0429 06/24/15 0922  BP: 122/52 115/63 106/61 102/59  Pulse: 74 89 72 99  Temp: 97.7 F (36.5 C) 98.5 F (36.9 C) 97.9 F (36.6 C) 97.9 F (36.6 C)  TempSrc: Oral Oral Oral Oral  Resp: 16 20 20 22   Height:      Weight:      SpO2: 96% 94% 95% 100%    Intake/Output Summary (Last 24 hours) at 06/24/15 1137 Last data filed at 06/24/15 1010  Gross per 24 hour  Intake    400 ml  Output      0 ml  Net    400 ml    Exam:   General:  Pt is somnolent, NAD  Cardiovascular: Irregular rate and rhythm,  no rubs, no gallops  Respiratory: Clear to auscultation bilaterally, rhonchi at bases   Abdomen: Soft, non tender, non distended,  bowel sounds present, no guarding  Extremities: pulses DP and PT palpable bilaterally  Data Reviewed: Basic Metabolic Panel:  Recent Labs Lab 06/22/15 1721 06/23/15 0535 06/24/15 0520  NA 139 141 140  K 4.2 4.4 4.1  CL 102 107 105  CO2 GLUCOSE 109* 79 68  BUN 27* 22* 15  CREATININE 1.00 0.72 0.64  CALCIUM 8.9 8.3* 8.2*   Liver Function Tests:  Recent Labs Lab 06/22/15 1721  AST 22  ALT 15  ALKPHOS 70  BILITOT 1.3*  PROT 5.2*  ALBUMIN 2.6*   CBC:  Recent Labs Lab 06/22/15 1721 06/23/15 0535 06/24/15 0520  WBC 9.3 6.3 4.7  NEUTROABS  6.3  --   --   HGB 9.7* 9.0* 8.6*  HCT 28.9* 27.2* 26.2*  MCV 100.3* 102.3* 101.9*  PLT 247 228 247   \CBG:  Recent Labs Lab 06/22/15 1728  GLUCAP 92    Recent Results (from the past 240 hour(s))  MRSA PCR Screening     Status: None   Collection Time: 06/22/15  9:27 PM  Result Value Ref Range Status   MRSA by PCR NEGATIVE NEGATIVE Final     Scheduled Meds: . apixaban  5 mg Oral BID  . cefTAZidime (FORTAZ)  IV  1 g Intravenous Q8H  . digoxin  0.25 mg Oral Daily  . divalproex  500 mg Oral BID  . furosemide  20 mg Oral Daily  . lisinopril  20 mg Oral Daily  . metoprolol succinate  50 mg Oral Daily  . metronidazole  500 mg Intravenous 3 times per day  . risperiDONE  1 mg Oral BID  . vancomycin  500 mg Intravenous Q12H   Continuous Infusions: . sodium chloride 50 mL/hr at 06/24/15 [phone]

## 2015-06-25 DIAGNOSIS — Z7189 Other specified counseling: Secondary | ICD-10-CM | POA: Insufficient documentation

## 2015-06-25 DIAGNOSIS — Z515 Encounter for palliative care: Secondary | ICD-10-CM

## 2015-06-25 LAB — VANCOMYCIN, TROUGH: VANCOMYCIN TR: 8 ug/mL — AB (ref 10.0–20.0)

## 2015-06-25 MED ORDER — RISPERIDONE 0.5 MG PO TABS
0.5000 mg | ORAL_TABLET | Freq: Two times a day (BID) | ORAL | Status: DC
  Administered 2015-06-25 – 2015-06-27 (×4): 0.5 mg via ORAL
  Filled 2015-06-25 (×5): qty 1

## 2015-06-25 MED ORDER — VANCOMYCIN HCL IN DEXTROSE 1-5 GM/200ML-% IV SOLN
1000.0000 mg | INTRAVENOUS | Status: DC
  Filled 2015-06-25: qty 200

## 2015-06-25 MED ORDER — CLINDAMYCIN HCL 300 MG PO CAPS
300.0000 mg | ORAL_CAPSULE | Freq: Four times a day (QID) | ORAL | Status: DC
  Administered 2015-06-25 – 2015-06-27 (×8): 300 mg via ORAL
  Filled 2015-06-25 (×12): qty 1

## 2015-06-25 MED ORDER — MEMANTINE HCL 5 MG PO TABS
5.0000 mg | ORAL_TABLET | Freq: Every day | ORAL | Status: DC
  Administered 2015-06-25 – 2015-06-27 (×3): 5 mg via ORAL
  Filled 2015-06-25 (×3): qty 1

## 2015-06-25 MED ORDER — VANCOMYCIN HCL IN DEXTROSE 750-5 MG/150ML-% IV SOLN
750.0000 mg | Freq: Two times a day (BID) | INTRAVENOUS | Status: DC
Start: 2015-06-25 — End: 2015-06-25

## 2015-06-25 NOTE — Clinical Social Work Note (Signed)
CSW called and spoke with pt's brother Channing MuttersRoy who is POA.  Channing MuttersRoy stated that he lived in [place] and asked that CSW call and talk with pt's sister Elita Quickam who resides in [place].  Channing MuttersRoy stated that [name] had the SNF choice.  Channing MuttersRoy also asked for a call from pt's MD so CSW send text message to Dr. Izola PriceMyers to ask her to call him.  CSW called and spoke with pt's sister and she [facility] at discharge.  [facility] that pt would like to discharge to their facility for her rehab needs.  Per pt's sister pt will not be returning to [place] her ALF and family will decide where she will reside when she has completed her rehab needs.  Elray Buba.Teyton Pattillo, LCSW [facility] Clinical Social Worker - Weekend Coverage cell #: [phone]

## 2015-06-25 NOTE — Progress Notes (Addendum)
Patient ID: Sheryl Horn, female   DOB: 09/30/1945, 69 y.o.   MRN: [id number] [facility] PROGRESS NOTE  Sheryl Horn WUJ:[id number] DOB: 12/19/1945 DOA: 06/22/2015 PCP: PROVIDER NOT IN SYSTEM   Brief narrative:    69 y.o. female with Atrial Fibrillation on Eliquis Rx, HTN, and Dementia who was sent from her Assisted Living Center to the ED due to increased confusion, and she fell out of her chair and hit her head but was without injury. She had not LOC. A chest X-ray revealed findings of a Bilateral Airspace Opacities and RLL pneumonia suggestive of possible aspiration pneumonia. She had a recent hospitalization for CAP.  Assessment/Plan:    Principal Problem:   Acute encephalopathy - appears to be multifactorial and secondary to progressive failure to thrive HCAP, ? Aspiration imposed on dementia, overmedications, pt was on multiple sedating medications - pt has been on broad spectrum abx for aspiration PNA, she has ben afebrile and WBC is WNL - dys III diet recommended based on SLP, will change ABX to Clindamycin  - avoid sedating medications, benzos and narcotics   Active Problems:   HCAP (healthcare-associated pneumonia), RLL ? RLL - transition to Clindamycin as noted above    Atrial fibrillation (HCC) - rate controlled, continue Eliquis and digoxin     HTN (hypertension) - reasonable inpatient control off antihypertensive medications  - 11/18, SBP was low so lasix and lisinopril held - metoprolol was held due to mild bradycardia     Dementia - advanced, more alert this AM    Malnutrition of moderate degree - advance diet as pt able to tolerate   DVT prophylaxis - pt on Eliquis   Code Status: Full.  Family Communication:  No family at bedside, brother over the phone  Disposition Plan: Needs SNF, d/c 11/21  IV access:  Peripheral IV  Procedures and diagnostic studies:    Dg Chest 2 View 06/22/2015  Bibasilar airspace opacities with interval worsening on  the right, ? aspiration pneumonia.   Ct Head Wo Contrast 06/20/2015 No acute or posttraumatic deformity. Cerebral/cerebellar atrophy and small vessel ischemic change.   Medical Consultants:  None  Other Consultants:  PT/OT/SLP  IAnti-Infectives:   Vanc and Elita QuickFortaz 11/16 -->  Debbora PrestoMAGICK-Deidre Carino, MD  [facility] Pager [phone]  If 7PM-7AM, please contact night-coverage [email or web address] Password [email or web address] 06/25/2015, 7:09 AM   LOS: 3 days   HPI/Subjective: No events overnight.   Objective: Filed Vitals:   06/24/15 1749 06/24/15 2316 06/25/15 0214 06/25/15 0649  BP: 111/53 92/52 100/49 131/53  Pulse: 99 71 59 68  Temp: 98.5 F (36.9 C) 98.5 F (36.9 C) 98.2 F (36.8 C) 98.4 F (36.9 C)  TempSrc: Oral Oral Oral Oral  Resp: 20 20 16 16   Height:      Weight:      SpO2: 100% 100% 99% 97%    Intake/Output Summary (Last 24 hours) at 06/25/15 0709 Last data filed at 06/24/15 1719  Gross per 24 hour  Intake    640 ml  Output      0 ml  Net    640 ml    Exam:   General:  Pt is NAD  Cardiovascular: Irregular rate and rhythm,  no rubs, no gallops  Respiratory: Clear to auscultation bilaterally, rhonchi at bases   Abdomen: Soft, non tender, non distended, bowel sounds present, no guarding  Extremities: pulses DP and PT palpable bilaterally  Data Reviewed: Basic Metabolic Panel:  Recent Labs Lab 06/22/15 1721 06/23/15 0535 06/24/15 0520  NA 139 141 140  K 4.2 4.4 4.1  CL 102 107 105  CO2 GLUCOSE 109* 79 68  BUN 27* 22* 15  CREATININE 1.00 0.72 0.64  CALCIUM 8.9 8.3* 8.2*   Liver Function Tests:  Recent Labs Lab 06/22/15 1721  AST 22  ALT 15  ALKPHOS 70  BILITOT 1.3*  PROT 5.2*  ALBUMIN 2.6*   CBC:  Recent Labs Lab 06/22/15 1721 06/23/15 0535 06/24/15 0520  WBC 9.3 6.3 4.7  NEUTROABS 6.3  --   --   HGB 9.7* 9.0* 8.6*  HCT 28.9* 27.2* 26.2*  MCV 100.3* 102.3* 101.9*  PLT 247 228 247   \CBG:  Recent Labs Lab 06/22/15 1728  GLUCAP  92    Recent Results (from the past 240 hour(s))  MRSA PCR Screening     Status: None   Collection Time: 06/22/15  9:27 PM  Result Value Ref Range Status   MRSA by PCR NEGATIVE NEGATIVE Final     Scheduled Meds: . apixaban  5 mg Oral BID  . cefTAZidime (FORTAZ)  IV  1 g Intravenous Q8H  . digoxin  0.25 mg Oral Daily  . divalproex  500 mg Oral BID  . metronidazole  500 mg Intravenous 3 times per day  . risperiDONE  1 mg Oral BID  . vancomycin  500 mg Intravenous Q12H   Continuous Infusions: . sodium chloride 75 mL/hr at 06/24/15 1543

## 2015-06-25 NOTE — Consult Note (Signed)
Consultation Note Date: 06/25/2015   Patient Name: Sheryl Horn  DOB: [birth year]  MRN: [id number]  Age / Sex: 69 y.o., female  PCP: Provider Not In System Referring Physician: Dorothea Ogle, MD  Reason for Consultation: Establishing goals of care  Life limiting illness: History of dementia, recurrent hospitalizations for aspiration pneumonia  Clinical Assessment/Narrative:   Patient is a 69 year old lady with a past medical history significant for atrial fibrillation, she is on oral anticoagulation with Eliquis, hypertension, dementia. Patient came from [facility] assisted living facility for complaints of acute encephalopathy, increasing confusion, falls. Patient was hospitalized earlier this month for possible aspiration pneumonia.  Patient has been admitted since 06/22/15 for possible aspiration pneumonia, weakness. Patient has been seen and evaluated by physical therapy, occupational therapy, speech therapy. She has been advised to be on mechanical soft diet, thin liquids, medications crushed with pure. Additionally, clinical social worker is following for possible placement into a higher level of care-skilled nursing facility as per the patient's brother [name] healthcare power of attorney's preference  Patient is awake alert resting in bed. She is able to state her name. Sheis in the hospital for pneumonia. She complains of musculoskeletal chest discomfort. She states she feels terrible. She states she has developed a falling problem. Patient's supplemental oxygen reapplied.  There is no family at the bedside. Call placed to patient's brother Donnie Gedeon home as well as cell number. Subsequently call placed to patient's sister Darrell Jewel. Discussed in detail about the patient's current hospitalization. Patient's sister states that her and her brother [name] are encouraged that the patient is quite awake quite alert and  has improved significantly since this hospitalization.  The patient's brother [name] is her healthcare power of attorney agent. The patient does not have a living will. Discuss CODE STATUS. CODE STATUS is full code for now. [name] will engage in more discussions with the patient as well as patient's brother [name].  Discussed the patient's dementia. Sister [name] stated that the patient was awake alert fully functional 2-3 weeks ago. She is very concerned that the patient might have received Valium and Ativan at her assisted living facility. Discussed in detail about the patient's bilateral upper extremity bruises. Patient has been falling. Additionally, patient is on Eliquis for atrial fibrillation. 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 All questions answered to the best of my ability.  Discussed about dysphagia in the setting of aspiration pneumonia and dementia. Discussed following precautions as advised by speech therapy.  Sister would like for the patient to be has out of bed as possible. She would like to see increased participation with physical therapy. Would like for the patient to be discharged to skilled nursing facility towards the end of this hospitalization.  Contacts/Participants in Discussion: Patient's sister, who stated that she will relay the above information to patient's brother boy Primary Decision Maker: Brother [name] Relationship to Patient sister HCPOA: yes  Brother [name]  SUMMARY OF RECOMMENDATIONS: Full code for now SNF towards the end of this hospitalization Continue aspiration precautions. Maximize safety and mobility.   Code Status/Advance Care Planning: Full code    Code Status Orders        Start     Ordered   06/22/15 2133  Full code   Continuous     06/22/15 2132      Other Directives:Other  Symptom Management:    continue current medications. Sister does not want the patient to get any benzos.  Palliative Prophylaxis:   Delirium Protocol  Additional  Recommendations (Limitations, Scope, Preferences):  Full Scope Treatment     Psycho-social/Spiritual:  Support System: Fair Desire for further Chaplaincy support:no Additional Recommendations: none  Prognosis: Unable to determine  Discharge Planning: Skilled Nursing Facility for rehab     Chief Complaint/ Primary Diagnoses: Present on Admission:  . HCAP (healthcare-associated pneumonia) . Atrial fibrillation (HCC) . HTN (hypertension) . Dementia . Acute encephalopathy  I have reviewed the medical record, interviewed the patient and family, and examined the patient. The following aspects are pertinent.  Past Medical History  Diagnosis Date  . Anxiety   . A-fib (HCC)   . Dementia   . Depression   . Hypertension    Social History   Social History  . Marital Status: Unknown    Spouse Name: N/A  . Number of Children: N/A  . Years of Education: N/A   Social History Main Topics  . Smoking status: Unknown If Ever Smoked  . Smokeless tobacco: Current User    Types: Snuff, Chew  . Alcohol Use: No  . Drug Use: No  . Sexual Activity: No   Other Topics Concern  . None   Social History Narrative   Family History  Problem Relation Age of Onset  . Family history unknown: Yes   Scheduled Meds: . apixaban  5 mg Oral BID  . cefTAZidime (FORTAZ)  IV  1 g Intravenous Q8H  . digoxin  0.25 mg Oral Daily  . divalproex  500 mg Oral BID  . metronidazole  500 mg Intravenous 3 times per day  . risperiDONE  1 mg Oral BID  . vancomycin  500 mg Intravenous Q12H   Continuous Infusions: . sodium chloride 75 mL/hr at 06/24/15 1543   PRN Meds:.acetaminophen **OR** acetaminophen, alum & mag hydroxide-simeth, HYDROmorphone (DILAUDID) injection, metoprolol, ondansetron **OR** ondansetron (ZOFRAN) IV, oxyCODONE Medications Prior to Admission:  Prior to Admission medications   Medication Sig Start Date End Date Taking? Authorizing Provider  acetaminophen (TYLENOL) 325 MG tablet  Take 650 mg by mouth every 6 (six) hours as needed for moderate pain.   Yes Historical Provider, MD  albuterol (VENTOLIN HFA) 108 (90 BASE) MCG/ACT inhaler Inhale 2 puffs into the lungs every 4 (four) hours as needed for wheezing.   Yes Historical Provider, MD  apixaban (ELIQUIS) 5 MG TABS tablet Take 5 mg by mouth 2 (two) times daily.   Yes Historical Provider, MD  citalopram (CELEXA) 10 MG tablet Take 10 mg by mouth daily.   Yes Historical Provider, MD  digoxin (LANOXIN) 0.25 MG tablet Take 0.25 mg by mouth daily.   Yes Historical Provider, MD  divalproex (DEPAKOTE) 500 MG DR tablet Take 500 mg by mouth 2 (two) times daily.   Yes Historical Provider, MD  Fluticasone-Salmeterol (ADVAIR) 500-50 MCG/DOSE AEPB Inhale 1 puff into the lungs 2 (two) times daily.   Yes Historical Provider, MD  furosemide (LASIX) 40 MG tablet Take 20 mg by mouth daily.   Yes Historical Provider, MD  LORazepam (ATIVAN) 0.5 MG tablet Take 0.5 mg by mouth at bedtime.   Yes Historical Provider, MD  LORazepam (ATIVAN) 0.5 MG tablet Take 1 tablet (0.5 mg total) by mouth daily as needed for anxiety. 06/14/15  Yes Zannie Cove, MD  metoprolol succinate (TOPROL-XL) 25 MG 24 hr tablet Take 50 mg by mouth daily.   Yes Historical Provider, MD  nicotine polacrilex (NICORETTE) 2 MG gum Take 2 mg by mouth as needed for smoking cessation.   Yes Historical Provider, MD  quinapril (ACCUPRIL)  20 MG tablet Take 20 mg by mouth daily.    Yes Historical Provider, MD  risperiDONE (RISPERDAL) 0.25 MG tablet Take 1 mg by mouth 2 (two) times daily.   Yes Historical Provider, MD  risperiDONE (RISPERDAL) 0.25 MG tablet Take 0.125 mg by mouth 2 (two) times daily as needed (For agitation.).   Yes Historical Provider, MD  levofloxacin (LEVAQUIN) 500 MG tablet Take 1 tablet (500 mg total) by mouth daily. For 3days Patient not taking: Reported on 06/22/2015 06/14/15   Zannie Cove, MD   Allergies  Allergen Reactions  . Bacitracin Other (See Comments)      Reaction unknown per MAR  . Other Other (See Comments)    HMG-COA reductase inhibitors - reaction unknown per MAR  . Penicillins Other (See Comments)    Reaction unknown per MAR  . Statins Other (See Comments)    Reaction unknown per [facility]    Review of Systems Positive for falls, weakness, dysphagia since the past 2-3 weeks per the patient's sister  Physical Exam Weak frail appearing lady  Awake alert Bruises bilateral upper extremities Oriented to person and place, knows she is in a hospital and has PNA Complains of generalized pain, states she has been falling Complains of dyspnea S1 S2 Abdomen soft Diminished bases  Vital Signs: BP 131/53 mmHg  Pulse 68  Temp(Src) 98.4 F (36.9 C) (Oral)  Resp 16  Ht 5\' 2"  (1.575 m)  Wt 56.7 kg (125 lb)  BMI 22.86 kg/m2  SpO2 97%  SpO2: SpO2: 97 % O2 Device:SpO2: 97 % O2 Flow Rate: .O2 Flow Rate (L/min): 2 L/min  IO: Intake/output summary:  Intake/Output Summary (Last 24 hours) at 06/25/15 0959 Last data filed at 06/24/15 1719  Gross per 24 hour  Intake    640 ml  Output      0 ml  Net    640 ml    LBM: Last BM Date:  (unknown per pt) Baseline Weight: Weight: 56.7 kg (125 lb) Most recent weight: Weight: 56.7 kg (125 lb)      Palliative Assessment/Data:  Flowsheet Rows        Most Recent Value   Intake Tab    Referral Department  Hospitalist   Unit at Time of Referral  Med/Surg Unit   Palliative Care Primary Diagnosis  Pulmonary   Date Notified  06/24/15   Palliative Care Type  New Palliative care   Reason for referral  Clarify Goals of Care   Date of Admission  06/22/15   Date first seen by Palliative Care  06/25/15   # of days Palliative referral response time  1 Day(s)   # of days IP prior to Palliative referral  2   Clinical Assessment    Palliative Performance Scale Score  30%   Pain Max last 24 hours  3   Pain Min Last 24 hours  2   Psychosocial & Spiritual Assessment    Social Work Plan of Care  Advance  care planning   Palliative Care Outcomes    Patient/Family meeting held?  No   Palliative Care Outcomes  Clarified goals of care   Palliative Care follow-up planned  Yes, Facility      Additional Data Reviewed:  CBC:    Component Value Date/Time   WBC 4.7 06/24/2015 0520   HGB 8.6* 06/24/2015 0520   HCT 26.2* 06/24/2015 0520   PLT 247 06/24/2015 0520   MCV 101.9* 06/24/2015 0520   NEUTROABS 6.3 06/22/2015 1721  LYMPHSABS 0.9 06/22/2015 1721   MONOABS 2.0* 06/22/2015 1721   EOSABS 0.0 06/22/2015 1721   BASOSABS 0.0 06/22/2015 1721   Comprehensive Metabolic Panel:    Component Value Date/Time   NA 140 06/24/2015 0520   K 4.1 06/24/2015 0520   CL 105 06/24/2015 0520   CO2 29 06/24/2015 0520   BUN 15 06/24/2015 0520   CREATININE 0.64 06/24/2015 0520   GLUCOSE 68 06/24/2015 0520   CALCIUM 8.2* 06/24/2015 0520   AST 22 06/22/2015 1721   ALT 15 06/22/2015 1721   ALKPHOS 70 06/22/2015 1721   BILITOT 1.3* 06/22/2015 1721   PROT 5.2* 06/22/2015 1721   ALBUMIN 2.6* 06/22/2015 1721     Time In: 0900 Time Out: 1000 Time Total: 60 min  Greater than 50%  of this time was spent counseling and coordinating care related to the above assessment and plan.  Signed by: Rosalin HawkingZeba Dorina Ribaudo, MD [id number] Rosalin HawkingZeba Melba Araki, MD  06/25/2015, 9:59 AM  Please contact Palliative Medicine Team phone at [phone] for questions and concerns.

## 2015-06-25 NOTE — Progress Notes (Signed)
ANTIBIOTIC CONSULT NOTE - Follow-Up  Pharmacy Consult for Aspiration PNA Indication: Ceftazidime / Flagyl / Vancomycin   Allergies  Allergen Reactions  . Bacitracin Other (See Comments)    Reaction unknown per MAR  . Other Other (See Comments)    HMG-COA reductase inhibitors - reaction unknown per MAR  . Penicillins Other (See Comments)    Reaction unknown per MAR  . Statins Other (See Comments)    Reaction unknown per [facility]    Patient Measurements: Height:  (157.5 cm) Weight: 125 lb (56.7 kg) IBW/kg (Calculated) : 50.1 Adjusted Body Weight:   Vital Signs: Temp: 98.4 F (36.9 C) (11/19 0649) Temp Source: Oral (11/19 0649) BP: 131/53 mmHg (11/19 0649) Pulse Rate: 68 (11/19 0649) Intake/Output from previous day: 11/18 0701 - 11/19 0700 In: 640 [P.O.:240; I.V.:400] Out: -  Intake/Output from this shift:    Labs:  Recent Labs  06/22/15 1721 06/23/15 0535 06/24/15 0520  WBC 9.3 6.3 4.7  HGB 9.7* 9.0* 8.6*  PLT 247 228 247  CREATININE 1.00 0.72 0.64   Estimated Creatinine Clearance: 51 mL/min (by C-G formula based on Cr of 0.64).  Recent Labs  06/25/15 0905  VANCOTROUGH 8*     Microbiology: Recent Results (from the past 720 hour(s))  Blood culture (routine x 2)     Status: None   Collection Time: 06/10/15  7:00 PM  Result Value Ref Range Status   Specimen Description BLOOD RIGHT ANTECUBITAL  Final   Special Requests BOTTLES DRAWN AEROBIC AND ANAEROBIC 10CC EACH  Final   Culture   Final    NO GROWTH 5 DAYS Performed at [facility]    Report Status 06/15/2015 FINAL  Final  Blood culture (routine x 2)     Status: None   Collection Time: 06/10/15  7:20 PM  Result Value Ref Range Status   Specimen Description BLOOD LEFT ANTECUBITAL  Final   Special Requests BOTTLES DRAWN AEROBIC AND ANAEROBIC 5CC EACH  Final   Culture   Final    NO GROWTH 5 DAYS Performed at [facility]    Report Status 06/15/2015 FINAL  Final  MRSA PCR Screening      Status: None   Collection Time: 06/22/15  9:27 PM  Result Value Ref Range Status   MRSA by PCR NEGATIVE NEGATIVE Final    Comment:        The GeneXpert MRSA Assay (FDA approved for NASAL specimens only), is one component of a comprehensive MRSA colonization surveillance program. It is not intended to diagnose MRSA infection nor to guide or monitor treatment for MRSA infections.   Culture, blood (routine x 2) Call MD if unable to obtain prior to antibiotics being given     Status: None (Preliminary result)   Collection Time: 06/22/15 10:00 PM  Result Value Ref Range Status   Specimen Description BLOOD LEFT HAND  Final   Special Requests BOTTLES DRAWN AEROBIC ONLY  Final   Culture   Final    NO GROWTH 2 DAYS Performed at [facility]    Report Status PENDING  Incomplete  Culture, blood (routine x 2) Call MD if unable to obtain prior to antibiotics being given     Status: None (Preliminary result)   Collection Time: 06/22/15 10:05 PM  Result Value Ref Range Status   Specimen Description BLOOD LEFT ANTECUBITAL  Final   Special Requests BOTTLES DRAWN AEROBIC AND ANAEROBIC  Final   Culture   Final  NO GROWTH 2 DAYS [facility]    Report Status PENDING  Incomplete    Medical History: Past Medical History  Diagnosis Date  . Anxiety   . A-fib (HCC)   . Dementia   . Depression   . Hypertension    Assessment: 89 yoF with PMH of atrial fibrillation on chronic apixaban, dementia, bipolar disorder, HTN, and recent hospitalization for HCAP brought to ED from NH for AMS.  Pt also seen in ED 11/14 after fall and discharged home after negative work-up. CXR shows bibasilar airspace opacities suspicious for aspiration PNA.  Pharmacy consulted to start ceftazidime and vancomycin, MD dosing flagyl. Allergy noted to penicillins (reaction unknown).  Pt has tolerated ceftazidime in the past.   Anti-infectives 11/16 >> Ceftazidime  >> 11/16 >> Flagyl   >>   11/16 >> Vancomycin >>  Vitals/Labs WBC: WNL Afebrile Renal: SCr 0.64 Vancomycin trough level = 8 mcg/mL, subtherapeutic  Cultures 11/16 blood x 2: NGTD MRSA PCR negative   Goal of Therapy:  Eradication of infection Vancomycin trough 15-3620mcg/ml  Plan:  - Given subtherapeutic trough level, change Vancomycin to 1000mg  IV x1 now, then 750mg  IV q12h - Continue Ceftazidime 1g IV q8h  - Continue Flagyl 500mg  IV q8h per MD - Follow renal function, repeat Vancomycin trough level as warranted, micro, clinical course - F/u transition to PO abx as indicated   Greer PickerelJigna Kanai Hilger, PharmD, BCPS Pager: [phone] 06/25/2015 10:42 AM

## 2015-06-26 DIAGNOSIS — E440 Moderate protein-calorie malnutrition: Secondary | ICD-10-CM

## 2015-06-26 LAB — BASIC METABOLIC PANEL
Anion gap: 6 (ref 5–15)
BUN: 8 mg/dL (ref 6–20)
CO2: 27 mmol/L (ref 22–32)
Calcium: 8.3 mg/dL — ABNORMAL LOW (ref 8.9–10.3)
Chloride: 107 mmol/L (ref 101–111)
Creatinine, Ser: 0.48 mg/dL (ref 0.44–1.00)
GFR calc Af Amer: >60 mL/min (ref >60)
GLUCOSE: 97 mg/dL (ref 65–99)
Potassium: 4.2 mmol/L (ref 3.5–5.1)
SODIUM: 140 mmol/L (ref 135–145)

## 2015-06-26 LAB — CBC
HEMATOCRIT: 26.9 % — AB (ref 36.0–46.0)
HEMOGLOBIN: 9 g/dL — AB (ref 12.0–15.0)
MCH: 33.6 pg (ref 26.0–34.0)
MCHC: 33.5 g/dL (ref 30.0–36.0)
MCV: 100.4 fL — AB (ref 78.0–100.0)
Platelets: 321 10*3/uL (ref 150–400)
RBC: 2.68 MIL/uL — ABNORMAL LOW (ref 3.87–5.11)
RDW: 14.8 % (ref 11.5–15.5)
WBC: 6.4 10*3/uL (ref 4.0–10.5)

## 2015-06-26 MED ORDER — METOPROLOL TARTRATE 12.5 MG HALF TABLET
12.5000 mg | ORAL_TABLET | Freq: Two times a day (BID) | ORAL | Status: DC
  Administered 2015-06-26 – 2015-06-27 (×3): 12.5 mg via ORAL
  Filled 2015-06-26 (×4): qty 1

## 2015-06-26 MED ORDER — MEMANTINE HCL 5 MG PO TABS: 5.0000 mg | ORAL_TABLET | Freq: Every day | ORAL | Status: DC

## 2015-06-26 MED ORDER — ALUM & MAG HYDROXIDE-SIMETH 200-200-20 MG/5ML PO SUSP: 30.0000 mL | Freq: Four times a day (QID) | ORAL | Status: AC | PRN

## 2015-06-26 MED ORDER — CLINDAMYCIN HCL 300 MG PO CAPS: 300.0000 mg | ORAL_CAPSULE | Freq: Four times a day (QID) | ORAL | Status: DC

## 2015-06-26 MED ORDER — METOPROLOL TARTRATE 25 MG PO TABS: 12.5000 mg | ORAL_TABLET | Freq: Two times a day (BID) | ORAL | Status: DC

## 2015-06-26 NOTE — Discharge Summary (Signed)
Physician Discharge Summary  Charmayne Odell ZOX:[id number] DOB: May 26, 1946 DOA: 06/22/2015  PCP: pt has no PCP, has been seen by the provider at the facility   Admit date: 06/22/2015 Discharge date: 06/27/2015  Recommendations for Outpatient Follow-up:  1. Pt will need to follow up with PCP in 1-2 weeks post discharge 2. Please obtain BMP to evaluate electrolytes and kidney function 3. Please also check CBC to evaluate Hg and Hct levels 4. Please avoid narcotic medications, family also asking not give any benzo's such as ativan and valium  5. Please note BP has been labile, Quinapril and Lasix stopped 6. OK to continue Metoprolol as HR tends to be high but may need to hold if SBP < 90 7. Namenda added to medical regimen   Discharge Diagnoses:  Principal Problem:   Acute encephalopathy Active Problems:   HCAP (healthcare-associated pneumonia)   Atrial fibrillation (HCC)   HTN (hypertension)   Dementia   Malnutrition of moderate degree   Encounter for palliative care   Goals of care, counseling/discussion   Discharge Condition: Stable  Diet recommendation: Dys III recommended with aspiration precautions    Brief narrative:    69 y.o. female with Atrial Fibrillation on Eliquis Rx, HTN, and Dementia who was sent from her Assisted Living Center to the ED due to increased confusion, and she fell out of her chair and hit her head but was without injury. She had not LOC. A chest X-ray revealed findings of a Bilateral Airspace Opacities and RLL pneumonia suggestive of possible aspiration pneumonia. She had a recent hospitalization for CAP.  Assessment/Plan:    Principal Problem:  Acute encephalopathy - appears to be multifactorial and secondary to progressive failure to thrive HCAP, ? Aspiration imposed on dementia, overmedications, pt was on multiple sedating medications - pt has been on broad spectrum abx for aspiration PNA, she has ben afebrile and WBC is WNL - dys  III diet recommended based on SLP, abx changed to Clindamycin  - avoid sedating medications, benzos and narcotics   Active Problems:  HCAP (healthcare-associated pneumonia), RLL ? RLL - transitioned to Clindamycin as noted above   Atrial fibrillation (HCC) - rate controlled, continue Eliquis and digoxin    HTN (hypertension) - reasonable inpatient control off antihypertensive medications  - 11/18, SBP was low so lasix and lisinopril held - metoprolol was added low dose but may need to be held if SBP < 90   Dementia - advanced, more alert this AM   Malnutrition of moderate degree - advance diet as pt able to tolerate   DVT prophylaxis - pt on Eliquis   Code Status: Full.  Family Communication: No family at bedside, brother over the phone  Disposition Plan: SNF In AM  IV access:  Peripheral IV  Procedures and diagnostic studies:   Dg Chest 2 View 06/22/2015 Bibasilar airspace opacities with interval worsening on the right, ? aspiration pneumonia.   Ct Head Wo Contrast 06/20/2015 No acute or posttraumatic deformity. Cerebral/cerebellar atrophy and small vessel ischemic change.   Medical Consultants:  None  Other Consultants:  PT/OT/SLP  IAnti-Infectives:   Vanc and Elita Quick 11/16 --> changed to Clindamycin        Discharge Exam: Filed Vitals:   06/25/15 2121 06/26/15 0541  BP: 149/74 135/80  Pulse: 100 108  Temp: 97.9 F (36.6 C) 98.8 F (37.1 C)  Resp: 20 18   Filed Vitals:   06/25/15 0649 06/25/15 1407 06/25/15 2121 06/26/15 0541  BP: 131/53 106/51 149/74 135/80  Pulse: 68 85 100 108  Temp: 98.4 F (36.9 C) 98.2 F (36.8 C) 97.9 F (36.6 C) 98.8 F (37.1 C)  TempSrc: Oral Oral Oral Oral  Resp: 16 18 20 18   Height:      Weight:      SpO2: 97% 96% 99% 96%    General: Pt is alert, follows commands appropriately, not in acute distress Cardiovascular: Regular rate and rhythm, no rubs, no gallops Respiratory: Clear to  auscultation bilaterally, no wheezing, no crackles, no rhonchi Abdominal: Soft, non tender, non distended, bowel sounds +, no guarding  Discharge Instructions     Medication List    STOP taking these medications        citalopram 10 MG tablet  Commonly known as:  CELEXA     Fluticasone-Salmeterol 500-50 MCG/DOSE Aepb  Commonly known as:  ADVAIR     furosemide 40 MG tablet  Commonly known as:  LASIX     levofloxacin 500 MG tablet  Commonly known as:  LEVAQUIN     LORazepam 0.5 MG tablet  Commonly known as:  ATIVAN     metoprolol succinate 25 MG 24 hr tablet  Commonly known as:  TOPROL-XL     nicotine polacrilex 2 MG gum  Commonly known as:  NICORETTE     quinapril 20 MG tablet  Commonly known as:  ACCUPRIL     VENTOLIN HFA 108 (90 BASE) MCG/ACT inhaler  Generic drug:  albuterol      TAKE these medications        acetaminophen 325 MG tablet  Commonly known as:  TYLENOL  Take 650 mg by mouth every 6 (six) hours as needed for moderate pain.     alum & mag hydroxide-simeth 200-200-20 MG/5ML suspension  Commonly known as:  MAALOX/MYLANTA  Take 30 mLs by mouth every 6 (six) hours as needed for indigestion or heartburn (dyspepsia).     clindamycin 300 MG capsule  Commonly known as:  CLEOCIN  Take 1 capsule (300 mg total) by mouth every 6 (six) hours.     digoxin 0.25 MG tablet  Commonly known as:  LANOXIN  Take 0.25 mg by mouth daily.     divalproex 500 MG DR tablet  Commonly known as:  DEPAKOTE  Take 500 mg by mouth 2 (two) times daily.     ELIQUIS 5 MG Tabs tablet  Generic drug:  apixaban  Take 5 mg by mouth 2 (two) times daily.     memantine 5 MG tablet  Commonly known as:  NAMENDA  Take 1 tablet (5 mg total) by mouth daily.     metoprolol tartrate 25 MG tablet  Commonly known as:  LOPRESSOR  Take 0.5 tablets (12.5 mg total) by mouth 2 (two) times daily.     risperiDONE 0.25 MG tablet  Commonly known as:  RISPERDAL  Take 1 mg by mouth 2 (two)  times daily.          The results of significant diagnostics from this hospitalization (including imaging, microbiology, ancillary and laboratory) are listed below for reference.     Microbiology: Recent Results (from the past 240 hour(s))  MRSA PCR Screening     Status: None   Collection Time: 06/22/15  9:27 PM  Result Value Ref Range Status   MRSA by PCR NEGATIVE NEGATIVE Final    Comment:        The GeneXpert MRSA Assay (FDA approved for NASAL specimens only), is one component of a comprehensive MRSA colonization surveillance  program. It is not intended to diagnose MRSA infection nor to guide or monitor treatment for MRSA infections.   Culture, blood (routine x 2) Call MD if unable to obtain prior to antibiotics being given     Status: None (Preliminary result)   Collection Time: 06/22/15 10:00 PM  Result Value Ref Range Status   Specimen Description BLOOD LEFT HAND  Final   Special Requests BOTTLES DRAWN AEROBIC ONLY  Final   Culture   Final    NO GROWTH 3 DAYS Performed at [facility]    Report Status PENDING  Incomplete  Culture, blood (routine x 2) Call MD if unable to obtain prior to antibiotics being given     Status: None (Preliminary result)   Collection Time: 06/22/15 10:05 PM  Result Value Ref Range Status   Specimen Description BLOOD LEFT ANTECUBITAL  Final   Special Requests BOTTLES DRAWN AEROBIC AND ANAEROBIC  Final   Culture   Final    NO GROWTH 3 DAYS Performed at [facility]    Report Status PENDING  Incomplete     Labs: Basic Metabolic Panel:  Recent Labs Lab 06/22/15 1721 06/23/15 0535 06/24/15 0520 06/26/15 0554  NA 139 141 140 140  K 4.2 4.4 4.1 4.2  CL 102 107 105 107  CO2 GLUCOSE 109* 79 68 97  BUN 27* 22* 15 8  CREATININE 1.00 0.72 0.64 0.48  CALCIUM 8.9 8.3* 8.2* 8.3*   Liver Function Tests:  Recent Labs Lab 06/22/15 1721  AST 22  ALT 15  ALKPHOS 70  BILITOT 1.3*  PROT 5.2*   ALBUMIN 2.6*   CBC:  Recent Labs Lab 06/22/15 1721 06/23/15 0535 06/24/15 0520 06/26/15 0554  WBC 9.3 6.3 4.7 6.4  NEUTROABS 6.3  --   --   --   HGB 9.7* 9.0* 8.6* 9.0*  HCT 28.9* 27.2* 26.2* 26.9*  MCV 100.3* 102.3* 101.9* 100.4*  PLT 247 228 247 321   CBG:  Recent Labs Lab 06/22/15 1728  GLUCAP 92     SIGNED: Time coordinating discharge:  30 minutes  MAGICK-MYERS, ISKRA, MD  [facility] 06/26/2015, 11:59 AM Pager [phone]  If 7PM-7AM, please contact night-coverage [email or web address] Password [email or web address]

## 2015-06-27 DIAGNOSIS — G9340 Encephalopathy, unspecified: Secondary | ICD-10-CM

## 2015-06-27 DIAGNOSIS — F0390 Unspecified dementia without behavioral disturbance: Secondary | ICD-10-CM

## 2015-06-27 DIAGNOSIS — J189 Pneumonia, unspecified organism: Secondary | ICD-10-CM

## 2015-06-27 LAB — CULTURE, BLOOD (ROUTINE X 2)
CULTURE: NO GROWTH
Culture: NO GROWTH

## 2015-06-27 NOTE — Clinical Social Work Note (Signed)
[facility]  Patient and family agreeable to plans will transport via ems RN to call report. Windell MouldingEric Ida Milbrath, MSW, Theresia MajorsLCSWA [phone]

## 2015-06-27 NOTE — Progress Notes (Signed)
Pt VSS. IV removed. Changed into paper gown. PTAR to transport pt to SNF/Rehab.

## 2015-06-27 NOTE — Discharge Instructions (Signed)
Aspiration Pneumonia  Aspiration pneumonia is an infection in your lungs. It occurs when food, liquid, or stomach contents (vomit) are inhaled (aspirated) into your lungs. When these things get into your lungs, swelling (inflammation) and infection can occur. This can make it difficult for you to breathe. Aspiration pneumonia is a serious condition and can be life threatening. RISK FACTORS Aspiration pneumonia is more likely to occur when a person's cough (gag) reflex or ability to swallow has been decreased. Some things that can do this include:   Having a brain injury or disease, such as stroke, seizures, Parkinson's disease, dementia, or amyotrophic lateral sclerosis (ALS).   Being given general anesthetic for procedures.   Being in a coma (unconscious).   Having a narrowing of the tube that carries food to the stomach (esophagus).   Drinking too much alcohol. If a person passes out and vomits, vomit can be swallowed into the lungs.   Taking certain medicines, such as tranquilizers or sedatives.  SIGNS AND SYMPTOMS   Coughing after swallowing food or liquids.   Breathing problems, such as wheezing or shortness of breath.   Bluish skin. This can be caused by lack of oxygen.   Coughing up food or mucus. The mucus might contain blood, greenish material, or yellowish-white fluid (pus).   Fever.   Chest pain.   Being more tired than usual (fatigue).   Sweating more than usual.   Bad breath.  DIAGNOSIS  A physical exam will be done. During the exam, the health care provider will listen to your lungs with a stethoscope to check for:   Crackling sounds in the lungs.  Decreased breath sounds.  A rapid heartbeat. Various tests may be ordered. These may include:   Chest X-ray.   CT scan.   Swallowing study. This test looks at how food is swallowed and whether it goes into your breathing tube (trachea) or food pipe (esophagus).   Sputum culture. Saliva and  mucus (sputum) are collected from the lungs or the tubes that carry air to the lungs (bronchi). The sputum is then tested for bacteria.   Bronchoscopy. This test uses a flexible tube (bronchoscope) to see inside the lungs. TREATMENT  Treatment will usually include antibiotic medicines. Other medicines may also be used to reduce fever or pain. You may need to be treated in the hospital. In the hospital, your breathing will be carefully monitored. Depending on how well you are breathing, you may need to be given oxygen, or you may need breathing support from a breathing machine (ventilator). For people who fail a swallowing study, a feeding tube might be placed in the stomach, or they may be asked to avoid certain food textures or liquids when they eat. HOME CARE INSTRUCTIONS   Carefully follow any special eating instructions you were given, such as avoiding certain food textures or thickening liquids. This reduces the risk of developing aspiration pneumonia again.  Only take over-the-counter or prescription medicines as directed by your health care provider. Follow the directions carefully.   If you were prescribed antibiotics, take them as directed. Finish them even if you start to feel better.   Rest as instructed by your health care provider.   Keep all follow-up appointments with your health care provider.  SEEK MEDICAL CARE IF:   You develop worsening shortness of breath, wheezing, or difficulty breathing.   You develop a fever.   You have chest pain.  MAKE SURE YOU:   Understand these instructions.  Will watch   your condition.  Will get help right away if you are not doing well or get worse.   This information is not intended to replace advice given to you by your health care provider. Make sure you discuss any questions you have with your health care provider.   Document Released: 05/20/2009 Document Revised: 07/28/2013 Document Reviewed: 01/08/2013 Elsevier  Interactive Patient Education 2016 Elsevier Inc.  

## 2015-06-27 NOTE — Care Management Note (Signed)
Case Management Note  Patient Details  Name: Sheryl Horn MRN: [id number] Date of Birth: 08/13/1945  Subjective/Objective:      69 y.o. female with Atrial Fibrillation on Eliquis Rx, HTN, and Dementia from her Assisted Living Center due to increased confusion, fall              Action/Plan: Discharge planning per CSW  Expected Discharge Date:   (unknown)               Expected Discharge Plan:  Skilled Nursing Facility  In-House Referral:  Clinical Social Work  Discharge planning Services  CM Consult  Post Acute Care Choice:  NA Choice offered to:  NA  DME Arranged:  N/A DME Agency:  NA  HH Arranged:  NA HH Agency:  NA  Status of Service:  Completed, signed off  Medicare Important Message Given:  Yes Date Medicare IM Given:    Medicare IM give by:    Date Additional Medicare IM Given:    Additional Medicare Important Message give by:     If discussed at Long Length of Stay Meetings, dates discussed:    Additional Comments:  [name], [name], RN 06/27/2015, 10:05 AM

## 2015-06-27 NOTE — Progress Notes (Signed)
Pt seen and examined at the bedside. Pt medically stable for discharge to SNF. Please refer to discharge summary completed 06/26/2015. No changes in medical management since 06/26/2015.  Manson Passeylma Shiryl Ruddy [facility] [phone]

## 2017-04-10 ENCOUNTER — Emergency Department (HOSPITAL_COMMUNITY): Payer: Medicare Other

## 2017-04-10 ENCOUNTER — Encounter (HOSPITAL_COMMUNITY): Payer: Self-pay | Admitting: Emergency Medicine

## 2017-04-10 ENCOUNTER — Inpatient Hospital Stay (HOSPITAL_COMMUNITY)
Admission: EM | Admit: 2017-04-10 | Discharge: 2017-04-22 | DRG: 871 | Disposition: A | Discharge status: Skilled Nursing Facility | Payer: Medicare Other | Attending: Internal Medicine | Admitting: Internal Medicine

## 2017-04-10 DIAGNOSIS — I700 Atherosclerosis of aorta: Secondary | ICD-10-CM

## 2017-04-10 DIAGNOSIS — N179 Acute kidney failure, unspecified: Secondary | ICD-10-CM | POA: Diagnosis present

## 2017-04-10 DIAGNOSIS — Z79899 Other long term (current) drug therapy: Secondary | ICD-10-CM

## 2017-04-10 DIAGNOSIS — J9601 Acute respiratory failure with hypoxia: Secondary | ICD-10-CM | POA: Diagnosis present

## 2017-04-10 DIAGNOSIS — I482 Chronic atrial fibrillation: Secondary | ICD-10-CM | POA: Diagnosis present

## 2017-04-10 DIAGNOSIS — I4891 Unspecified atrial fibrillation: Secondary | ICD-10-CM

## 2017-04-10 DIAGNOSIS — F319 Bipolar disorder, unspecified: Secondary | ICD-10-CM | POA: Diagnosis present

## 2017-04-10 DIAGNOSIS — F03918 Unspecified dementia, unspecified severity, with other behavioral disturbance: Secondary | ICD-10-CM | POA: Diagnosis present

## 2017-04-10 DIAGNOSIS — Z23 Encounter for immunization: Secondary | ICD-10-CM

## 2017-04-10 DIAGNOSIS — F0281 Dementia in other diseases classified elsewhere with behavioral disturbance: Secondary | ICD-10-CM | POA: Diagnosis present

## 2017-04-10 DIAGNOSIS — R410 Disorientation, unspecified: Secondary | ICD-10-CM

## 2017-04-10 DIAGNOSIS — I110 Hypertensive heart disease with heart failure: Secondary | ICD-10-CM | POA: Diagnosis present

## 2017-04-10 DIAGNOSIS — Z880 Allergy status to penicillin: Secondary | ICD-10-CM

## 2017-04-10 DIAGNOSIS — Z888 Allergy status to other drugs, medicaments and biological substances status: Secondary | ICD-10-CM

## 2017-04-10 DIAGNOSIS — F17220 Nicotine dependence, chewing tobacco, uncomplicated: Secondary | ICD-10-CM | POA: Diagnosis present

## 2017-04-10 DIAGNOSIS — G309 Alzheimer's disease, unspecified: Secondary | ICD-10-CM | POA: Diagnosis present

## 2017-04-10 DIAGNOSIS — F0391 Unspecified dementia with behavioral disturbance: Secondary | ICD-10-CM

## 2017-04-10 DIAGNOSIS — D696 Thrombocytopenia, unspecified: Secondary | ICD-10-CM | POA: Diagnosis present

## 2017-04-10 DIAGNOSIS — G4700 Insomnia, unspecified: Secondary | ICD-10-CM | POA: Diagnosis not present

## 2017-04-10 DIAGNOSIS — I639 Cerebral infarction, unspecified: Secondary | ICD-10-CM | POA: Diagnosis present

## 2017-04-10 DIAGNOSIS — I10 Essential (primary) hypertension: Secondary | ICD-10-CM | POA: Diagnosis present

## 2017-04-10 DIAGNOSIS — A419 Sepsis, unspecified organism: Secondary | ICD-10-CM | POA: Diagnosis not present

## 2017-04-10 DIAGNOSIS — R6520 Severe sepsis without septic shock: Secondary | ICD-10-CM | POA: Diagnosis present

## 2017-04-10 DIAGNOSIS — E860 Dehydration: Secondary | ICD-10-CM | POA: Diagnosis present

## 2017-04-10 DIAGNOSIS — J189 Pneumonia, unspecified organism: Secondary | ICD-10-CM | POA: Diagnosis present

## 2017-04-10 DIAGNOSIS — I5042 Chronic combined systolic (congestive) and diastolic (congestive) heart failure: Secondary | ICD-10-CM | POA: Diagnosis present

## 2017-04-10 DIAGNOSIS — Z7901 Long term (current) use of anticoagulants: Secondary | ICD-10-CM

## 2017-04-10 LAB — BLOOD GAS, VENOUS
Acid-Base Excess: 5.3 mmol/L — ABNORMAL HIGH (ref 0.0–2.0)
BICARBONATE: 30.2 mmol/L — AB (ref 20.0–28.0)
O2 Saturation: 49.4 %
PCO2 VEN: 46.9 mmHg (ref 44.0–60.0)
PH VEN: 7.424 (ref 7.250–7.430)
Patient temperature: 98.6

## 2017-04-10 LAB — URINALYSIS, ROUTINE W REFLEX MICROSCOPIC
BILIRUBIN URINE: NEGATIVE
GLUCOSE, UA: NEGATIVE mg/dL
Hgb urine dipstick: NEGATIVE
KETONES UR: 5 mg/dL — AB
Nitrite: NEGATIVE
PROTEIN: NEGATIVE mg/dL
Specific Gravity, Urine: 1.019 (ref 1.005–1.030)
pH: 6 (ref 5.0–8.0)

## 2017-04-10 LAB — VALPROIC ACID LEVEL: Valproic Acid Lvl: 58 ug/mL (ref 50.0–100.0)

## 2017-04-10 LAB — COMPREHENSIVE METABOLIC PANEL
ALT: 14 U/L (ref 14–54)
ANION GAP: 7 (ref 5–15)
AST: 17 U/L (ref 15–41)
Albumin: 3.3 g/dL — ABNORMAL LOW (ref 3.5–5.0)
Alkaline Phosphatase: 86 U/L (ref 38–126)
BILIRUBIN TOTAL: 0.6 mg/dL (ref 0.3–1.2)
BUN: 17 mg/dL (ref 6–20)
CHLORIDE: 105 mmol/L (ref 101–111)
CO2: 30 mmol/L (ref 22–32)
Calcium: 8.9 mg/dL (ref 8.9–10.3)
Creatinine, Ser: 1.12 mg/dL — ABNORMAL HIGH (ref 0.44–1.00)
GFR calc Af Amer: 55 mL/min — ABNORMAL LOW (ref >60)
GFR, EST NON AFRICAN AMERICAN: 48 mL/min — AB (ref >60)
Glucose, Bld: 92 mg/dL (ref 65–99)
POTASSIUM: 3.6 mmol/L (ref 3.5–5.1)
Sodium: 142 mmol/L (ref 135–145)
TOTAL PROTEIN: 6.4 g/dL — AB (ref 6.5–8.1)

## 2017-04-10 LAB — CBC WITH DIFFERENTIAL/PLATELET
BASOS ABS: 0 10*3/uL (ref 0.0–0.1)
BASOS PCT: 1 %
EOS PCT: 5 %
Eosinophils Absolute: 0.3 10*3/uL (ref 0.0–0.7)
HEMATOCRIT: 43.1 % (ref 36.0–46.0)
Hemoglobin: 14 g/dL (ref 12.0–15.0)
LYMPHS PCT: 41 %
Lymphs Abs: 2.5 10*3/uL (ref 0.7–4.0)
MCH: 30.7 pg (ref 26.0–34.0)
MCHC: 32.5 g/dL (ref 30.0–36.0)
MCV: 94.5 fL (ref 78.0–100.0)
MONO ABS: 0.8 10*3/uL (ref 0.1–1.0)
Monocytes Relative: 13 %
Neutro Abs: 2.4 10*3/uL (ref 1.7–7.7)
Neutrophils Relative %: 40 %
Platelets: 146 10*3/uL — ABNORMAL LOW (ref 150–400)
RBC: 4.56 MIL/uL (ref 3.87–5.11)
RDW: 14.9 % (ref 11.5–15.5)
WBC: 6 10*3/uL (ref 4.0–10.5)

## 2017-04-10 LAB — AMMONIA: AMMONIA: 38 umol/L — AB (ref 9–35)

## 2017-04-10 LAB — CBG MONITORING, ED: Glucose-Capillary: 84 mg/dL (ref 65–99)

## 2017-04-10 MED ORDER — AZITHROMYCIN 250 MG PO TABS
500.0000 mg | ORAL_TABLET | Freq: Once | ORAL | Status: AC
  Administered 2017-04-10: 500 mg via ORAL

## 2017-04-10 MED ORDER — AZITHROMYCIN 250 MG PO TABS
500.0000 mg | ORAL_TABLET | Freq: Once | ORAL | Status: DC
  Filled 2017-04-10: qty 2

## 2017-04-10 MED ORDER — CEPHALEXIN 500 MG PO CAPS
500.0000 mg | ORAL_CAPSULE | Freq: Two times a day (BID) | ORAL | Status: DC
  Filled 2017-04-10: qty 1

## 2017-04-10 MED ORDER — CEPHALEXIN 500 MG PO CAPS
500.0000 mg | ORAL_CAPSULE | Freq: Two times a day (BID) | ORAL | Status: DC
  Administered 2017-04-10 – 2017-04-11 (×3): 500 mg via ORAL
  Filled 2017-04-10 (×2): qty 1

## 2017-04-10 MED ORDER — AZITHROMYCIN 250 MG PO TABS: 250.0000 mg | ORAL_TABLET | Freq: Every day | ORAL | Status: DC

## 2017-04-10 MED ORDER — AZITHROMYCIN 250 MG PO TABS
250.0000 mg | ORAL_TABLET | Freq: Every day | ORAL | Status: DC
  Administered 2017-04-11: 250 mg via ORAL
  Filled 2017-04-10: qty 1

## 2017-04-10 NOTE — ED Notes (Signed)
Social worker at bedside.

## 2017-04-10 NOTE — ED Triage Notes (Signed)
Pt's brother states the pt has had increased AMS for the past 2 months. Pt brother is concerned dementia has gotten worse or if her medications need to be reevaluated. Pt currently lives in a memory care unit at [facility] [place]

## 2017-04-10 NOTE — ED Notes (Signed)
ED Provider at bedside. 

## 2017-04-10 NOTE — BH Assessment (Signed)
Assessment Note  Sheryl Horn is an 71 y.o. female with history of Dementia, depression, and anxiety. [facility]. Patient is voluntary. Patient is a poor historian and her brother present was able to provide information. He explains that patient lives at [facility]. He is concerned that patient's dementia is progressed. He has noticed patient to be more withdrawn than normal. He is concerned about her isolating herself from others and not talking as much. He shows a video of patient 2 months ago on her [place] Ouch[place]irthday. Patient appeared to be happy and active in the video. He doesn't understand why patient's behavior has changed so dramatically in the past 2 months. He denies that she has a psychiatric history. 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 Her last drink was many years ago.  He is requesting a neurologist consult and medication adjustments. Patient says that the memory care unit suggested her to come to the ER for a psychiatric evaluation. Patient denied SI, HI, and AVH's. No history of violence but needs consistent redirection. She has no history of INPT treatment. She does not have a psychiatrist or therapist.   Diagnosis: Depressive Disorder(per history); Dementia (per history); Anxiety (per history)  Past Medical History:  Past Medical History:  Diagnosis Date  . A-fib (HCC)   . Anxiety   . Dementia   . Depression   . Hypertension     History reviewed. No pertinent surgical history.  Family History:  Family History  Problem Relation Age of Onset  . Family history unknown: Yes    Social History:  has an unknown smoking status. Her smokeless tobacco use includes Snuff and Chew. She reports that she does not drink alcohol or use drugs.  Additional Social History:  Alcohol / Drug Use Pain Medications: SEE MAR Prescriptions: SEE MAR Over the Counter: SEE MAR History of alcohol / drug use?: No history of alcohol / drug  abuse  CIWA: CIWA-Ar BP: (!) 135/55 Pulse Rate: 81 COWS:    Allergies:  Allergies  Allergen Reactions  . Bacitracin Other (See Comments)    Reaction unknown per MAR  . Other Other (See Comments)    HMG-COA reductase inhibitors - reaction unknown per MAR  . Penicillins Other (See Comments)    Reaction unknown per MAR  . Statins Other (See Comments)    [facility]    Home Medications:  (Not in a hospital admission)  OB/GYN Status:  No LMP recorded. Patient is postmenopausal.  General Assessment Data TTS Assessment: In system Is this a Tele or Face-to-Face Assessment?: Face-to-Face Is this an Initial Assessment or a Re-assessment for this encounter?: Initial Assessment Marital status: Single Maiden name:  (n/a) Is patient pregnant?: No Pregnancy Status: No Living Arrangements: Other (Comment) ([facility]) Can pt return to current living arrangement?: No Admission Status: Voluntary Is patient capable of signing voluntary admission?: Yes Referral Source: Self/Family/Friend Insurance type:  [employer](Medicare)     Crisis Care Plan Living Arrangements: Other (Comment) (lives [facility]) Legal Guardian: Other: (no) Name of Psychiatrist:  (no pyschiatrist ) Name of Therapist:  (no therapist )  Education Status Is patient currently in school?: No Current Grade:  (n/a) Highest grade of school patient has completed:  (n/a) Name of school:  (n/a) Contact person: n/a  Risk to self with the past 6 months Suicidal Ideation: No Has patient been a risk to self within  the past 6 months prior to admission? : No Suicidal Intent: No Has patient had any suicidal intent within the past 6 months prior to admission? : No Is patient at risk for suicide?: No Suicidal Plan?: No Has patient had any suicidal plan within the past 6 months prior to admission? : No Access to Means: No What has been your use of  drugs/alcohol within the last 12 months?:  (per brother... "Patient is a former alcoholic") Previous Attempts/Gestures: No How many times?:  (0) Other Self Harm Risks:  (no self harm ) Triggers for Past Attempts: Other (Comment) (no triggers for past attempts or gestures ) Intentional Self Injurious Behavior: None Family Suicide History: No Recent stressful life event(s): Other (Comment) (no stressors indicated ) Persecutory voices/beliefs?: No Depression:  (unknown; patient appears withdrawn ) Depression Symptoms:  (unknown) Substance abuse history and/or treatment for substance abuse?: No Suicide prevention information given to non-admitted patients: Not applicable  Risk to Others within the past 6 months Homicidal Ideation: No Does patient have any lifetime risk of violence toward others beyond the six months prior to admission? : No Thoughts of Harm to Others: No Current Homicidal Intent: No Current Homicidal Plan: No Access to Homicidal Means: No Identified Victim:  (n/a) History of harm to others?: No Assessment of Violence: None Noted Violent Behavior Description:  (patient is calm and cooperative ) Does patient have access to weapons?: No Criminal Charges Pending?: No Does patient have a court date: No Is patient on probation?: No  Psychosis Hallucinations:  (none reported) Delusions: Unspecified (none reported)  Mental Status Report Appearance/Hygiene: In hospital gown Eye Contact: Fair Motor Activity: Unremarkable Speech: Logical/coherent Level of Consciousness: Alert Mood: Depressed Affect: Appropriate to circumstance Anxiety Level: None Thought Processes: Relevant, Coherent Judgement: Impaired Orientation: Person, Place, Time Obsessive Compulsive Thoughts/Behaviors: None  Cognitive Functioning Concentration: Decreased Memory: Recent Intact, Remote Intact IQ: Average Insight: Poor Impulse Control: Fair Appetite: Good Weight Loss:  (none  reported) Weight Gain:  (none reported) Sleep: No Change Total Hours of Sleep:  (sleeps at 7pm; wakes up at 6am) Vegetative Symptoms: None  ADLScreening [facility] Assessment Services) Patient's cognitive ability adequate to safely complete daily activities?: Yes Patient able to express need for assistance with ADLs?: Yes Independently performs ADLs?: Yes (appropriate for developmental age)     Prior Outpatient Therapy Prior Outpatient Therapy: No Prior Therapy Dates:  (n/a) Prior Therapy Facilty/Provider(s):  (n/a) Reason for Treatment:  (n/a') Does patient have an ACCT team?: No Does patient have Intensive In-House Services?  : No Does patient have [facility] services? : No Does patient have P4CC services?: No  ADL Screening (condition at time of admission) Patient's cognitive ability adequate to safely complete daily activities?: Yes Is the patient deaf or have difficulty hearing?: No Does the patient have difficulty seeing, even when wearing glasses/contacts?: No Does the patient have difficulty concentrating, remembering, or making decisions?: No Patient able to express need for assistance with ADLs?: Yes Does the patient have difficulty dressing or bathing?: No Independently performs ADLs?: Yes (appropriate for developmental age) Does the patient have difficulty walking or climbing stairs?: No Weakness of Legs: None Weakness of Arms/Hands: None  Home Assistive Devices/Equipment Home Assistive Devices/Equipment: None    Abuse/Neglect Assessment (Assessment to be complete while patient is alone) Physical Abuse: Denies Verbal Abuse: Denies Sexual Abuse: Denies Exploitation of patient/patient's resources: Denies Self-Neglect: Denies Values / Beliefs Cultural Requests During Hospitalization: None Spiritual Requests During Hospitalization: None   Advance Directives (For Healthcare) Does Patient  Have a Medical Advance Directive?: No Would patient like information on creating  a medical advance directive?: No - Patient declined Nutrition Screen- [facility] Adult/WL/AP Patient's home diet: Regular  Additional Information 1:1 In Past 12 Months?: No CIRT Risk: No Elopement Risk: No Does patient have medical clearance?: Yes     Disposition: Per Shuvon Rankin, Np, patient did not have any labs back at the time of the assessment. Shuvon Rankin, NP is unable to determine patient's disposition until medical clearance is completed. Disposition is for patient to be medically cleared and remain in the ER for observation. Pending am psych evaluation.  Disposition Initial Assessment Completed for this Encounter: Yes Disposition of Patient: Other dispositions (Per Shuvon, NP, pending medical clearance. ) Other disposition(s): Other (Comment) (Pending am psych evaluation and medical clearance)  On Site Evaluation by:   Reviewed with Physician:    Melynda Ripple 04/10/2017 6:36 PM

## 2017-04-10 NOTE — ED Provider Notes (Signed)
[facility]-EMERGENCY DEPT Provider Note   CSN: [id number] Arrival date & time: 04/10/17  1526     History   Chief Complaint Chief Complaint  Patient presents with  . Altered Mental Status    HPI Sheryl Horn is a 71 y.o. female.  71 yo F with a chief complaint of altered mental status. This is been a slow deterioration since June. The family states that she has been limited in her verbal interaction with other people and has been having trouble sitting still. She is currently in a memory care unit and they feel that they have added medications that made things worse. No new medications in the past 2 months. No known head trauma. No fevers or chills. No cough or congestion. Level V caveat altered mental status.   The history is provided by the patient.  Altered Mental Status   This is a chronic problem. The current episode started more than 1 week ago. The problem has been gradually worsening. Associated symptoms include agitation. Risk factors include a change in prescription. Her past medical history is significant for dementia and psychotropic medication treatment.    Past Medical History:  Diagnosis Date  . A-fib (HCC)   . Anxiety   . Dementia   . Depression   . Hypertension     Patient Active Problem List   Diagnosis Date Noted  . Encounter for palliative care   . Goals of care, counseling/discussion   . Acute encephalopathy 06/23/2015  . Malnutrition of moderate degree 06/23/2015  . HCAP (healthcare-associated pneumonia) 06/10/2015  . Atrial fibrillation (HCC) 06/10/2015  . HTN (hypertension) 06/10/2015  . Dementia 06/10/2015  . Anxiety 06/10/2015    History reviewed. No pertinent surgical history.  OB History    No data available       Home Medications    Prior to Admission medications   Medication Sig Start Date End Date Taking? Authorizing Provider  albuterol (VENTOLIN HFA) 108 (90 Base) MCG/ACT inhaler Inhale 2 puffs into the lungs every 6 (six) hours as  needed for wheezing or shortness of breath.   Yes [provider]  apixaban (ELIQUIS) 5 MG TABS tablet Take 5 mg by mouth 2 (two) times daily.   Yes [provider]  busPIRone (BUSPAR) 15 MG tablet Take 7.5 mg by mouth 2 (two) times daily.   Yes [provider]  diltiazem (TIAZAC) 300 MG 24 hr capsule Take 300 mg by mouth daily.   Yes [provider]  divalproex (DEPAKOTE SPRINKLE) 125 MG capsule Take 250 mg by mouth 3 (three) times daily.   Yes [provider]  furosemide (LASIX) 40 MG tablet Take 40 mg by mouth daily.   Yes [provider]  LORazepam (ATIVAN) 0.5 MG tablet Take 0.5 mg by mouth every 8 (eight) hours.   Yes [provider]  Melatonin 10 MG CAPS Take 1 capsule by mouth at bedtime.   Yes [provider]  memantine (NAMENDA) 10 MG tablet Take 10 mg by mouth 2 (two) times daily.   Yes [provider]  metoprolol succinate (TOPROL-XL) 100 MG 24 hr tablet Take 100 mg by mouth daily. Take with or immediately following a meal.   Yes [provider]  Nutritional Supplements (NUTRA/SHAKE FREE PO) Take 237 mLs by mouth 2 (two) times daily.   Yes [provider]  nystatin (MYCOSTATIN/NYSTOP) powder Apply 1 Bottle topically 2 (two) times daily.   Yes [provider]  Omega-3 Fatty Acids (FISH OIL) 1000  MG CAPS Take 2 capsules by mouth 2 (two) times daily.   Yes [provider]  potassium chloride SA (K-DUR,KLOR-CON) 20 MEQ tablet Take 20 mEq by mouth daily.   Yes [provider]  ranitidine (ZANTAC) 150 MG tablet Take 150 mg by mouth daily.   Yes [provider]  traZODone (DESYREL) 50 MG tablet Take 50 mg by mouth at bedtime.   Yes [provider]  alum & mag hydroxide-simeth (MAALOX/MYLANTA) 200-200-20 MG/5ML suspension Take 30 mLs by mouth every 6 (six) hours as needed for indigestion or heartburn (dyspepsia). Patient not taking: Reported on 04/10/2017  06/26/15   Dorothea Ogle, MD  clindamycin (CLEOCIN) 300 MG capsule Take 1 capsule (300 mg total) by mouth every 6 (six) hours. Patient not taking: Reported on 04/10/2017 06/26/15   Dorothea Ogle, MD  memantine (NAMENDA) 5 MG tablet Take 1 tablet (5 mg total) by mouth daily. Patient not taking: Reported on 04/10/2017 06/26/15   Dorothea Ogle, MD  metoprolol tartrate (LOPRESSOR) 25 MG tablet Take 0.5 tablets (12.5 mg total) by mouth 2 (two) times daily. Patient not taking: Reported on 04/10/2017 06/26/15   Dorothea Ogle, MD    Family History Family History  Problem Relation Age of Onset  . Family history unknown: Yes    Social History Social History  Substance Use Topics  . Smoking status: Unknown If Ever Smoked  . Smokeless tobacco: Current User    Types: Snuff, Chew  . Alcohol use No     Allergies   Bacitracin; Other; Penicillins; and Statins   Review of Systems Review of Systems  Unable to perform ROS: Mental status change  Constitutional: Positive for activity change. Negative for chills and fever.  HENT: Negative for congestion and rhinorrhea.   Eyes: Negative for redness and visual disturbance.  Respiratory: Negative for shortness of breath and wheezing.   Cardiovascular: Negative for chest pain and palpitations.  Gastrointestinal: Negative for nausea and vomiting.  Genitourinary: Negative for dysuria and urgency.  Musculoskeletal: Negative for arthralgias and myalgias.  Skin: Negative for pallor and wound.  Neurological: Negative for dizziness and headaches.  Psychiatric/Behavioral: Positive for agitation.     Physical Exam Updated Vital Signs BP 126/61 (BP Location: Right Arm)   Pulse 60   Temp 97.6 F (36.4 C) (Oral)   Resp 19   SpO2 95%   Physical Exam  Constitutional: She appears well-developed and well-nourished. No distress.  HENT:  Head: Normocephalic and atraumatic.  Eyes: Pupils are equal, round, and reactive to light. EOM are normal.  Neck:  Normal range of motion. Neck supple.  Cardiovascular: Normal rate and regular rhythm.  Exam reveals no gallop and no friction rub.   No murmur heard. Pulmonary/Chest: Effort normal. She has no wheezes. She has no rales.  Abdominal: Soft. She exhibits no distension. There is no tenderness.  Musculoskeletal: She exhibits no edema or tenderness.  Neurological: She is alert.  Unable to sit still, stands and paces around the room, will interact.  Moving all four extremities, stable gait  Skin: Skin is warm and dry. She is not diaphoretic.  Psychiatric: She has a normal mood and affect. Her behavior is normal.  Nursing note and vitals reviewed.    ED Treatments / Results  Labs (all labs ordered are listed, but only abnormal results are displayed) Labs Reviewed  CBC WITH DIFFERENTIAL/PLATELET - Abnormal; Notable for the following:       Result Value   Platelets 146 (*)  All other components within normal limits  COMPREHENSIVE METABOLIC PANEL - Abnormal; Notable for the following:    Creatinine, Ser 1.12 (*)    Total Protein 6.4 (*)    Albumin 3.3 (*)    GFR calc non Af Amer 48 (*)    GFR calc Af Amer 55 (*)    All other components within normal limits  BLOOD GAS, VENOUS - Abnormal; Notable for the following:    Bicarbonate 30.2 (*)    Acid-Base Excess 5.3 (*)    All other components within normal limits  AMMONIA - Abnormal; Notable for the following:    Ammonia 38 (*)    All other components within normal limits  URINALYSIS, ROUTINE W REFLEX MICROSCOPIC - Abnormal; Notable for the following:    Ketones, ur 5 (*)    Leukocytes, UA TRACE (*)    Bacteria, UA RARE (*)    Squamous Epithelial / LPF 0-5 (*)    All other components within normal limits  VALPROIC ACID LEVEL  CBG MONITORING, ED    EKG  EKG Interpretation  Date/Time:  Wednesday April 10 2017 18:29:56 EDT Ventricular Rate:  69 PR Interval:    QRS Duration: 100 QT Interval:  401 QTC Calculation: 430 R  Axis:     Text Interpretation:  Atrial fibrillation No significant change since last tracing Confirmed by Melene PlanFloyd, Terina Mcelhinny [phone]) on 04/10/2017 7:41:42 PM       Radiology Dg Chest 2 View  Result Date: 04/10/2017 CLINICAL DATA:  Progressive altered mental status for 2 months. EXAM: CHEST  2 VIEW COMPARISON:  Radiographs 06/22/2015 FINDINGS: Lung volumes. Mild cardiomegaly. Tortuous atherosclerotic thoracic aorta. Interstitial and bronchial thickening which is similar allowing for differences in technique. Patchy bibasilar opacities, right greater than left, progressed from prior exam. No pleural fluid. No pneumothorax. The bones are under mineralized. IMPRESSION: 1. Patchy bibasilar opacities, right greater than left, persistent or recurrent from 2016 exam. This may reflect atelectasis superimposed on scarring versus sequela of aspiration in the appropriate clinical setting. 2. Mild cardiomegaly and atherosclerosis of the thoracic aorta. 3. Chronic interstitial and bronchial thickening, grossly stable allowing for lower lung volumes. Electronically Signed   By: Rubye OaksMelanie  Ehinger M.D.   On: 04/10/2017 18:08   Ct Head Wo Contrast  Result Date: 04/10/2017 CLINICAL DATA:  Altered mental status. EXAM: CT HEAD WITHOUT CONTRAST TECHNIQUE: Contiguous axial images were obtained from the base of the skull through the vertex without intravenous contrast. COMPARISON:  CT scan of June 20, 2015. FINDINGS: Brain: Mild diffuse cortical atrophy is noted. Mild chronic ischemic white matter disease is noted. No mass effect or midline shift is noted. Ventricular size is within normal limits. There is no evidence of mass lesion, hemorrhage or acute infarction. Vascular: No hyperdense vessel or unexpected calcification. Skull: Normal. Negative for fracture or focal lesion. Sinuses/Orbits: No acute finding. Other: None. IMPRESSION: Mild diffuse cortical atrophy. Mild chronic ischemic white matter disease. No acute intracranial  abnormality seen. Electronically Signed   By: Lupita RaiderJames  Green Jr, M.D.   On: 04/10/2017 18:00    Procedures Procedures (including critical care time)  Medications Ordered in ED Medications  cephALEXin (KEFLEX) capsule 500 mg (not administered)  azithromycin (ZITHROMAX) tablet 500 mg (not administered)    Followed by  azithromycin (ZITHROMAX) tablet 250 mg (not administered)     Initial Impression / Assessment and Plan / ED Course  I have reviewed the triage vital signs and the nursing notes.  Pertinent labs & imaging results that  were available during my care of the patient were reviewed by me and considered in my medical decision making (see chart for details).     71 yo F with AMS.  Will obtain AMS workup.  2 mos of symptoms.    Chest x-ray with possible right lower lobe pneumonia. Family is unsure if she's been having aspiration events or coughing fits. Unsure about fevers or chills. She also has a mild creatinine bump from previous. Her GFR was greater than 60 is now estimated at 48 though likely lower with her muscle mass. Will discuss with hospitalist.  Discussed with Toniann Fail, as this has been going on for two months, he feels unlikely to be acute, and would have this managed as outpatient.  TTS eval with availability of psych to see patient in the morning. Treat for ? Pna.    The patients results and plan were reviewed and discussed.   Any x-rays performed were independently reviewed by myself.   Differential diagnosis were considered with the presenting HPI.  Medications  cephALEXin (KEFLEX) capsule 500 mg (not administered)  azithromycin (ZITHROMAX) tablet 500 mg (not administered)    Followed by  azithromycin (ZITHROMAX) tablet 250 mg (not administered)    Vitals:   04/10/17 1604 04/10/17 1839 04/10/17 1844 04/10/17 2047  BP: (!) 135/55  (!) 106/50 126/61  Pulse: 81  67 60  Resp: 18  17 19   Temp:  97.8 F (36.6 C)  97.6 F (36.4 C)  TempSrc:  Oral  Oral  SpO2:  100%  99% 95%    Final diagnoses:  Disorientation     Final Clinical Impressions(s) / ED Diagnoses   Final diagnoses:  Disorientation    New Prescriptions New Prescriptions   No medications on file     Melene Plan, DO 04/10/17 2228

## 2017-04-10 NOTE — Clinical Social Work Note (Signed)
Clinical Social Work Assessment  Patient Details  Name: Sheryl Horn MRN: [id number] Date of Birth: 1945-10-30  Date of referral:  04/10/17               Reason for consult:  Facility Placement                Permission sought to share information with:  Facility Art therapist granted to share information::  Yes, Verbal Permission Granted  Name::        Agency::     Relationship::     Contact Information:     Housing/Transportation Living arrangements for the past 2 months:  [place] of Information:   (Pt's brother) Patient Interpreter Needed:  None Criminal Activity/Legal Involvement Pertinent to Current Situation/Hospitalization:    Significant Relationships:   (Brother) Lives with:  Facility Resident Do you feel safe going back to the place where you live?  Yes Need for family participation in patient care:  Yes (Comment)  Care giving concerns:  Pt's brother is concerned that pt has been especially acute for the past two months and feels medication changes or a neurologist may assist in determining pt needs.   Social Worker assessment / plan:  CSW met with pt and pt was not oriented and CSW confirmed pt's brother understands that pt's plan to be discharged to SNF to live at discharge may not be possible, due to pt not meeting inpatient criteria.  CSW provided active listening and validated pt's brother's concerns.   Pt has been living at [facility], per pt's brother,prior to being admitted to [facility].  Employment status:  Retired Forensic scientist:  Medicare PT Recommendations:  Not assessed at this time Information / Referral to community resources:     Patient/Family's Response to care:  Patient not alert and oriented.  Patient's brother not agreeable to plan but understands pt does not meet inpatient criteria at time of assessment..  Pt's brother supportive and strongly involved in  pt.'s care.  Pt.'s brother pleasant and appreciated CSW intervention.      Patient/Family's Understanding of and Emotional Response to Diagnosis, Current Treatment, and Prognosis:  Still assessing  Emotional Assessment Appearance:  Appears stated age Attitude/Demeanor/Rapport:  Unable to Assess Affect (typically observed):  Unable to Assess Orientation:  Fluctuating Orientation (Suspected and/or reported Sundowners) Alcohol / Substance use:    Psych involvement (Current and /or in the community):     Discharge Needs  Concerns to be addressed:  Care Coordination Readmission within the last 30 days:  No Current discharge risk:  Cognitively Impaired Barriers to Discharge:  No Barriers Identified   Claudine Mouton, LCSWA 04/10/2017, 9:28 PM

## 2017-04-10 NOTE — ED Notes (Signed)
Patient transported to CT 

## 2017-04-11 ENCOUNTER — Encounter (HOSPITAL_COMMUNITY): Payer: Self-pay | Admitting: Internal Medicine

## 2017-04-11 DIAGNOSIS — F0281 Dementia in other diseases classified elsewhere with behavioral disturbance: Secondary | ICD-10-CM | POA: Diagnosis not present

## 2017-04-11 DIAGNOSIS — G309 Alzheimer's disease, unspecified: Secondary | ICD-10-CM

## 2017-04-11 DIAGNOSIS — F0391 Unspecified dementia with behavioral disturbance: Secondary | ICD-10-CM | POA: Diagnosis not present

## 2017-04-11 DIAGNOSIS — I4891 Unspecified atrial fibrillation: Secondary | ICD-10-CM | POA: Diagnosis present

## 2017-04-11 LAB — I-STAT CHEM 8, ED
BUN: 11 mg/dL (ref 6–20)
CALCIUM ION: 1.06 mmol/L — AB (ref 1.15–1.40)
CREATININE: 0.9 mg/dL (ref 0.44–1.00)
Chloride: 109 mmol/L (ref 101–111)
GLUCOSE: 126 mg/dL — AB (ref 65–99)
HCT: 52 % — ABNORMAL HIGH (ref 36.0–46.0)
HEMOGLOBIN: 17.7 g/dL — AB (ref 12.0–15.0)
Potassium: 3.3 mmol/L — ABNORMAL LOW (ref 3.5–5.1)
Sodium: 147 mmol/L — ABNORMAL HIGH (ref 135–145)
TCO2: 24 mmol/L (ref 22–32)

## 2017-04-11 LAB — I-STAT TROPONIN, ED: Troponin i, poc: 0.06 ng/mL (ref 0.00–0.08)

## 2017-04-11 MED ORDER — ACETAMINOPHEN 325 MG PO TABS
650.0000 mg | ORAL_TABLET | Freq: Four times a day (QID) | ORAL | Status: DC | PRN
  Administered 2017-04-12 – 2017-04-20 (×5): 650 mg via ORAL
  Filled 2017-04-11 (×5): qty 2

## 2017-04-11 MED ORDER — ALBUTEROL SULFATE (2.5 MG/3ML) 0.083% IN NEBU: 3.0000 mL | INHALATION_SOLUTION | Freq: Four times a day (QID) | RESPIRATORY_TRACT | Status: DC | PRN

## 2017-04-11 MED ORDER — DEXTROSE 5 % IV SOLN
1.0000 g | Freq: Every day | INTRAVENOUS | Status: DC
  Administered 2017-04-12 – 2017-04-15 (×5): 1 g via INTRAVENOUS
  Filled 2017-04-11 (×6): qty 10

## 2017-04-11 MED ORDER — FAMOTIDINE 20 MG PO TABS
20.0000 mg | ORAL_TABLET | Freq: Every day | ORAL | Status: DC
  Administered 2017-04-11 – 2017-04-22 (×12): 20 mg via ORAL
  Filled 2017-04-11 (×13): qty 1

## 2017-04-11 MED ORDER — ONDANSETRON HCL 4 MG/2ML IJ SOLN
4.0000 mg | Freq: Four times a day (QID) | INTRAMUSCULAR | Status: DC | PRN
Start: 2017-04-11 — End: 2017-04-22

## 2017-04-11 MED ORDER — LORAZEPAM 1 MG PO TABS
1.0000 mg | ORAL_TABLET | Freq: Once | ORAL | Status: AC
  Administered 2017-04-11: 1 mg via ORAL
  Filled 2017-04-11: qty 1

## 2017-04-11 MED ORDER — POTASSIUM CHLORIDE CRYS ER 20 MEQ PO TBCR
20.0000 meq | EXTENDED_RELEASE_TABLET | Freq: Every day | ORAL | Status: DC
Start: 2017-04-11 — End: 2017-04-17
  Administered 2017-04-11 – 2017-04-16 (×6): 20 meq via ORAL
  Filled 2017-04-11 (×7): qty 1

## 2017-04-11 MED ORDER — LORAZEPAM 0.5 MG PO TABS
0.5000 mg | ORAL_TABLET | Freq: Three times a day (TID) | ORAL | Status: DC
  Administered 2017-04-12 – 2017-04-22 (×27): 0.5 mg via ORAL
  Filled 2017-04-11 (×28): qty 1

## 2017-04-11 MED ORDER — METOPROLOL TARTRATE 5 MG/5ML IV SOLN
5.0000 mg | INTRAVENOUS | Status: AC | PRN
Start: 2017-04-11 — End: 2017-04-11
  Administered 2017-04-11 (×3): 5 mg via INTRAVENOUS
  Filled 2017-04-11 (×2): qty 5

## 2017-04-11 MED ORDER — BUSPIRONE HCL 5 MG PO TABS
7.5000 mg | ORAL_TABLET | Freq: Two times a day (BID) | ORAL | Status: DC
  Administered 2017-04-11 – 2017-04-16 (×11): 7.5 mg via ORAL
  Filled 2017-04-11 (×10): qty 2
  Filled 2017-04-11 (×2): qty 1
  Filled 2017-04-11: qty 2

## 2017-04-11 MED ORDER — DIVALPROEX SODIUM 125 MG PO CSDR
250.0000 mg | DELAYED_RELEASE_CAPSULE | Freq: Three times a day (TID) | ORAL | Status: DC
  Administered 2017-04-11 – 2017-04-22 (×33): 250 mg via ORAL
  Filled 2017-04-11 (×37): qty 2

## 2017-04-11 MED ORDER — LORAZEPAM 2 MG/ML IJ SOLN
INTRAMUSCULAR | Status: AC
Start: 2017-04-11 — End: 2017-04-11
  Administered 2017-04-11: 0.5 mg
  Filled 2017-04-11: qty 1

## 2017-04-11 MED ORDER — ASENAPINE MALEATE 5 MG SL SUBL
5.0000 mg | SUBLINGUAL_TABLET | Freq: Once | SUBLINGUAL | Status: AC
  Administered 2017-04-11: 5 mg via SUBLINGUAL
  Filled 2017-04-11: qty 1

## 2017-04-11 MED ORDER — MEMANTINE HCL 10 MG PO TABS
10.0000 mg | ORAL_TABLET | Freq: Two times a day (BID) | ORAL | Status: DC
  Administered 2017-04-11 – 2017-04-16 (×11): 10 mg via ORAL
  Filled 2017-04-11: qty 1
  Filled 2017-04-11 (×11): qty 2
  Filled 2017-04-11: qty 1
  Filled 2017-04-11: qty 2

## 2017-04-11 MED ORDER — ASENAPINE MALEATE 5 MG SL SUBL
5.0000 mg | SUBLINGUAL_TABLET | Freq: Two times a day (BID) | SUBLINGUAL | Status: DC | PRN
  Administered 2017-04-12: 5 mg via SUBLINGUAL
  Filled 2017-04-11 (×3): qty 1

## 2017-04-11 MED ORDER — METOPROLOL SUCCINATE ER 25 MG PO TB24
100.0000 mg | ORAL_TABLET | Freq: Every day | ORAL | Status: DC
  Administered 2017-04-11: 100 mg via ORAL
  Filled 2017-04-11: qty 1

## 2017-04-11 MED ORDER — ESMOLOL HCL-SODIUM CHLORIDE 2000 MG/100ML IV SOLN
25.0000 ug/kg/min | INTRAVENOUS | Status: DC
  Administered 2017-04-11: 25 ug/kg/min via INTRAVENOUS
  Filled 2017-04-11 (×3): qty 100

## 2017-04-11 MED ORDER — TRAZODONE HCL 50 MG PO TABS
50.0000 mg | ORAL_TABLET | Freq: Every day | ORAL | Status: DC
  Administered 2017-04-11 – 2017-04-21 (×11): 50 mg via ORAL
  Filled 2017-04-11 (×13): qty 1

## 2017-04-11 MED ORDER — APIXABAN 5 MG PO TABS
5.0000 mg | ORAL_TABLET | Freq: Two times a day (BID) | ORAL | Status: DC
  Administered 2017-04-11 – 2017-04-22 (×22): 5 mg via ORAL
  Filled 2017-04-11 (×24): qty 1

## 2017-04-11 MED ORDER — ONDANSETRON HCL 4 MG PO TABS: 4.0000 mg | ORAL_TABLET | Freq: Four times a day (QID) | ORAL | Status: DC | PRN

## 2017-04-11 MED ORDER — DILTIAZEM HCL ER BEADS 300 MG PO CP24
300.0000 mg | ORAL_CAPSULE | Freq: Every day | ORAL | Status: DC
  Administered 2017-04-11: 300 mg via ORAL
  Filled 2017-04-11 (×2): qty 1

## 2017-04-11 MED ORDER — DEXTROSE 5 % IV SOLN
500.0000 mg | INTRAVENOUS | Status: DC
  Administered 2017-04-12: 500 mg via INTRAVENOUS
  Filled 2017-04-11: qty 500

## 2017-04-11 MED ORDER — HALOPERIDOL LACTATE 5 MG/ML IJ SOLN
2.0000 mg | Freq: Four times a day (QID) | INTRAMUSCULAR | Status: DC | PRN
  Administered 2017-04-20 – 2017-04-21 (×2): 2 mg via INTRAVENOUS
  Filled 2017-04-11 (×3): qty 1

## 2017-04-11 MED ORDER — ACETAMINOPHEN 650 MG RE SUPP
650.0000 mg | Freq: Four times a day (QID) | RECTAL | Status: DC | PRN
  Filled 2017-04-11: qty 1

## 2017-04-11 NOTE — ED Provider Notes (Signed)
  Physical Exam  BP (!) 130/59 (BP Location: Right Arm)   Pulse 83   Temp 97.6 F (36.4 C) (Oral)   Resp 18   SpO2 98%   Physical Exam  ED Course  Procedures  MDM  Pt has hx of dementia and arrives to the ER with what appears to be worsening dementia. Dr. Adela LankFloyd has requested psych team to see the patient in the AM for med reconciliation and then d/c back to the memory care.  Medically cleared.       Derwood KaplanNanavati, Mychelle Kendra, MD 04/11/17 0005

## 2017-04-11 NOTE — ED Notes (Signed)
Admitting MD gave RN a verbal order to decrease esmolol drip down to 100 mcg/kg/hr

## 2017-04-11 NOTE — Discharge Instructions (Signed)
Transfer to [facility].  °

## 2017-04-11 NOTE — H&P (Signed)
History and Physical    Sheryl Horn ZOX:[id number] DOB: 05-18-1946 DOA: 04/10/2017  PCP: System, Provider Not In  Patient coming from: Nursing home.  Chief Complaint: Altered mental status.  HPI: Sheryl Horn is a 71 y.o. female with history of advanced dementia was brought to the ER with increasing confusion and behavioral disturbance. In the ER patient was seen by behavioral health team and advised inpatient psychiatric admission and patient was accepted at [facility]. Patient was waiting for transfer when patient's heart rate increased and was found to be in A. fib with RVR. Patient has advanced dementia and does not provide any history. Patient has a known history of A. fib and is on metoprolol and Cardizem for rate control and on Apixaban.  ED Course: In the ER patient was started on Cardizem infusion. Chest x-ray shows possible infiltrates for which patient was started on antibiotics. Labs also revealed mild thrombocytopenia and possible acute renal failure.  Review of Systems: As per HPI, rest all negative.   Past Medical History:  Diagnosis Date  . A-fib (HCC)   . Anxiety   . Dementia   . Depression   . Hypertension     History reviewed. No pertinent surgical history.   has an unknown smoking status. Her smokeless tobacco use includes Snuff and Chew. She reports that she does not drink alcohol or use drugs.  Allergies  Allergen Reactions  . Bacitracin Other (See Comments)    Reaction unknown per MAR  . Other Other (See Comments)    HMG-COA reductase inhibitors - reaction unknown per MAR  . Penicillins Other (See Comments)    Reaction unknown per MAR  . Statins Other (See Comments)    Reaction unknown per [facility]    Family History  Problem Relation Age of Onset  . Family history unknown: Yes    Prior to Admission medications   Medication Sig Start Date End Date Taking? Authorizing Provider  albuterol (VENTOLIN HFA) 108 (90 Base) MCG/ACT inhaler Inhale 2  puffs into the lungs every 6 (six) hours as needed for wheezing or shortness of breath.   Yes Provider, Historical, Horn  apixaban (ELIQUIS) 5 MG TABS tablet Take 5 mg by mouth 2 (two) times daily.   Yes Provider, Historical, Horn  busPIRone (BUSPAR) 15 MG tablet Take 7.5 mg by mouth 2 (two) times daily.   Yes Provider, Historical, Horn  diltiazem (TIAZAC) 300 MG 24 hr capsule Take 300 mg by mouth daily.   Yes Provider, Historical, Horn  divalproex (DEPAKOTE SPRINKLE) 125 MG capsule Take 250 mg by mouth 3 (three) times daily.   Yes Provider, Historical, Horn  furosemide (LASIX) 40 MG tablet Take 40 mg by mouth daily.   Yes Provider, Historical, Horn  LORazepam (ATIVAN) 0.5 MG tablet Take 0.5 mg by mouth every 8 (eight) hours.   Yes Provider, Historical, Horn  Melatonin 10 MG CAPS Take 1 capsule by mouth at bedtime.   Yes Provider, Historical, Horn  memantine (NAMENDA) 10 MG tablet Take 10 mg by mouth 2 (two) times daily.   Yes Provider, Historical, Horn  metoprolol succinate (TOPROL-XL) 100 MG 24 hr tablet Take 100 mg by mouth daily. Take with or immediately following a meal.   Yes Provider, Historical, Horn  Nutritional Supplements (NUTRA/SHAKE FREE PO) Take 237 mLs by mouth 2 (two) times daily.   Yes Provider, Historical, Horn  nystatin (MYCOSTATIN/NYSTOP) powder Apply 1 Bottle topically 2 (two) times daily.   Yes Provider, Historical, Horn  Omega-3  Fatty Acids (FISH OIL) 1000 MG CAPS Take 2 capsules by mouth 2 (two) times daily.   Yes Provider, Historical, Horn  potassium chloride SA (K-DUR,KLOR-CON) 20 MEQ tablet Take 20 mEq by mouth daily.   Yes Provider, Historical, Horn  ranitidine (ZANTAC) 150 MG tablet Take 150 mg by mouth daily.   Yes Provider, Historical, Horn  traZODone (DESYREL) 50 MG tablet Take 50 mg by mouth at bedtime.   Yes Provider, Historical, Horn  alum & mag hydroxide-simeth (MAALOX/MYLANTA) 200-200-20 MG/5ML suspension Take 30 mLs by mouth every 6 (six) hours as needed for indigestion or heartburn  (dyspepsia). Patient not taking: Reported on 04/10/2017 06/26/15   Dorothea Ogle, Horn  clindamycin (CLEOCIN) 300 MG capsule Take 1 capsule (300 mg total) by mouth every 6 (six) hours. Patient not taking: Reported on 04/10/2017 06/26/15   Dorothea Ogle, Horn  memantine (NAMENDA) 5 MG tablet Take 1 tablet (5 mg total) by mouth daily. Patient not taking: Reported on 04/10/2017 06/26/15   Dorothea Ogle, Horn  metoprolol tartrate (LOPRESSOR) 25 MG tablet Take 0.5 tablets (12.5 mg total) by mouth 2 (two) times daily. Patient not taking: Reported on 04/10/2017 06/26/15   Dorothea Ogle, Horn    Physical Exam: Vitals:   04/11/17 2100 04/11/17 2109 04/11/17 2143 04/11/17 2201  BP: (!) 127/97  93/61 99/61  Pulse: (!) 45 (!) 107 88 91  Resp: (!) Temp:      TempSrc:      SpO2: 97% 93% 95% 97%  Weight:          Constitutional: Moderately built and nourished. Vitals:   04/11/17 2100 04/11/17 2109 04/11/17 2143 04/11/17 2201  BP: (!) 127/97  93/61 99/61  Pulse: (!) 45 (!) 107 88 91  Resp: (!) Temp:      TempSrc:      SpO2: 97% 93% 95% 97%  Weight:       Eyes: Anicteric. No pallor. ENMT: No discharge from the ears eyes nose or mouth. Neck: No mass felt. No JVD appreciated. Respiratory: No rhonchi or crepitations. Cardiovascular: S1-S2 heard no murmurs appreciated. Abdomen: Soft nontender bowel sounds present. Musculoskeletal: No edema. No joint effusion. Skin: No rash. Skin appears warm. Neurologic: Alert awake with advanced dementia patient does not follow any commands. Psychiatric: Advanced dementia.   Labs on Admission: I have personally reviewed following labs and imaging studies  CBC:  Recent Labs Lab 04/10/17 1833 04/11/17 1803  WBC 6.0  --   NEUTROABS 2.4  --   HGB 14.0 17.7*  HCT 43.1 52.0*  MCV 94.5  --   PLT 146*  --    Basic Metabolic Panel:  Recent Labs Lab 04/10/17 1833 04/11/17 1803  NA 142 147*  K 3.6 3.3*  CL 105 109  CO2 30  --     GLUCOSE 92 126*  BUN 17 11  CREATININE 1.12* 0.90  CALCIUM 8.9  --    GFR: CrCl cannot be calculated (Unknown ideal weight.). Liver Function Tests:  Recent Labs Lab 04/10/17 1833  AST 17  ALT 14  ALKPHOS 86  BILITOT 0.6  PROT 6.4*  ALBUMIN 3.3*   No results for input(s): LIPASE, AMYLASE in the last 168 hours.  Recent Labs Lab 04/10/17 1833  AMMONIA 38*   Coagulation Profile: No results for input(s): INR, PROTIME in the last 168 hours. Cardiac Enzymes: No results for input(s): CKTOTAL, CKMB, CKMBINDEX, TROPONINI in the last 168  hours. BNP (last 3 results) No results for input(s): PROBNP in the last 8760 hours. HbA1C: No results for input(s): HGBA1C in the last 72 hours. CBG:  Recent Labs Lab 04/10/17 1833  GLUCAP 84   Lipid Profile: No results for input(s): CHOL, HDL, LDLCALC, TRIG, CHOLHDL, LDLDIRECT in the last 72 hours. Thyroid Function Tests: No results for input(s): TSH, T4TOTAL, FREET4, T3FREE, THYROIDAB in the last 72 hours. Anemia Panel: No results for input(s): VITAMINB12, FOLATE, FERRITIN, TIBC, IRON, RETICCTPCT in the last 72 hours. Urine analysis:    Component Value Date/Time   COLORURINE YELLOW 04/10/2017 1858   APPEARANCEUR CLEAR 04/10/2017 1858   LABSPEC 1.019 04/10/2017 1858   PHURINE 6.0 04/10/2017 1858   GLUCOSEU NEGATIVE 04/10/2017 1858   HGBUR NEGATIVE 04/10/2017 1858   BILIRUBINUR NEGATIVE 04/10/2017 1858   KETONESUR 5 (A) 04/10/2017 1858   PROTEINUR NEGATIVE 04/10/2017 1858   UROBILINOGEN 2.0 (H) 06/10/2015 1724   NITRITE NEGATIVE 04/10/2017 1858   LEUKOCYTESUR TRACE (A) 04/10/2017 1858   Sepsis Labs: (procalcitonin:4,lacticidven:4) )No results found for this or any previous visit (from the past 240 hour(s)).   Radiological Exams on Admission: Dg Chest 2 View  Result Date: 04/10/2017 CLINICAL DATA:  Progressive altered mental status for 2 months. EXAM: CHEST  2 VIEW COMPARISON:  Radiographs 06/22/2015 FINDINGS: Lung  volumes. Mild cardiomegaly. Tortuous atherosclerotic thoracic aorta. Interstitial and bronchial thickening which is similar allowing for differences in technique. Patchy bibasilar opacities, right greater than left, progressed from prior exam. No pleural fluid. No pneumothorax. The bones are under mineralized. IMPRESSION: 1. Patchy bibasilar opacities, right greater than left, persistent or recurrent from 2016 exam. This may reflect atelectasis superimposed on scarring versus sequela of aspiration in the appropriate clinical setting. 2. Mild cardiomegaly and atherosclerosis of the thoracic aorta. 3. Chronic interstitial and bronchial thickening, grossly stable allowing for lower lung volumes. Electronically Signed   By: Rubye Oaks M.D.   On: 04/10/2017 18:08   Ct Head Wo Contrast  Result Date: 04/10/2017 CLINICAL DATA:  Altered mental status. EXAM: CT HEAD WITHOUT CONTRAST TECHNIQUE: Contiguous axial images were obtained from the base of the skull through the vertex without intravenous contrast. COMPARISON:  CT scan of June 20, 2015. FINDINGS: Brain: Mild diffuse cortical atrophy is noted. Mild chronic ischemic white matter disease is noted. No mass effect or midline shift is noted. Ventricular size is within normal limits. There is no evidence of mass lesion, hemorrhage or acute infarction. Vascular: No hyperdense vessel or unexpected calcification. Skull: Normal. Negative for fracture or focal lesion. Sinuses/Orbits: No acute finding. Other: None. IMPRESSION: Mild diffuse cortical atrophy. Mild chronic ischemic white matter disease. No acute intracranial abnormality seen. Electronically Signed   By: Lupita Raider, M.D.   On: 04/10/2017 18:00    EKG: Independently reviewed. A. fib with RVR.  Assessment/Plan Principal Problem:   Atrial fibrillation with RVR (HCC) Active Problems:   HTN (hypertension)   Dementia with behavioral disturbance   Atrial fibrillation with rapid ventricular  response (HCC)    1. A. fib with RVR - patient has been placed on Cardizem infusion. We'll continue patient's oral Cardizem and beta blockers and once patient takes these medications will try to wean off Cardizem infusion. Patient is on Apixaban. Chads 2 vasc score is more than 2. Check TSH. 2. Possible pneumonia - patient is on empiric antibiotics for pneumonia. Likely could be from aspiration. Get swallow evaluation name. 3. Acute renal failure - could be from dehydration. We'll hold Lasix  for now and closely follow metabolic panel. 4. Dementia with behavioral disturbances - continue Namenda most part trazodone and Depakote. Further recommendations per psychiatrist. 5. Mild thrombocytopenia - follow CBC.  I have reviewed patient's old charts and labs.   DVT prophylaxis: Apixaban. Code Status: Full code.  Family Communication: No family at the bedside.  Disposition Plan: Psychiatric inpatient.  Consults called: Behavioral Health was seeing the patient.  Admission status: Observation.    Eduard ClosKAKRAKANDY,Sheryl Horn [facility] Pager [phone].  If 7PM-7AM, please contact night-coverage [email or web address] Password [email or web address]  04/11/2017, 11:11 PM

## 2017-04-11 NOTE — ED Notes (Signed)
Bed: WA28 Expected date:  Expected time:  Means of arrival:  Comments: 

## 2017-04-11 NOTE — Progress Notes (Deleted)
Pt discharged back to facility via [facility] in good condition.

## 2017-04-11 NOTE — Progress Notes (Signed)
[employer] served patient with IVC documents.   Stacy GardnerErin Tysen Roesler, [facility] Emergency Room Clinical Social Worker [phone]

## 2017-04-11 NOTE — Consult Note (Signed)
[facility] Psychiatry Consult   Reason for Consult:  Dementia Referring Physician:  EDP Patient Identification: Sheryl Horn MRN:  [id number] Principal Diagnosis: Dementia with behavioral disturbance Diagnosis:   Patient Active Problem List   Diagnosis Date Noted  . Encounter for palliative care [Z51.5]   . Goals of care, counseling/discussion [Z71.89]   . Acute encephalopathy [G93.40] 06/23/2015  . Malnutrition of moderate degree [E44.0] 06/23/2015  . HCAP (healthcare-associated pneumonia) [J18.9] 06/10/2015  . Atrial fibrillation ([place]) [I48.91] 06/10/2015  . HTN (hypertension) [I10] 06/10/2015  . Dementia with behavioral disturbance [F03.91] 06/10/2015  . Anxiety [F41.9] 06/10/2015    Total Time spent with patient: 45 minutes  Subjective:   Sheryl Horn is a 71 y.o. female patient admitted with advanced demnetia.  HPI: Pt was seen and chart reviewed with Dr Darleene Cleaver. Pt has severe, advanced dementia and was unable to participate in the assessment. No family present at the bedside. Pt was able to speak a few nonsensical sentences but unable to identify a pen or piece of paper. Inpatient geriatric psychiatric admission recommended.   Past Psychiatric History: as above   Risk to Self: Suicidal Ideation: No Suicidal Intent: No Is patient at risk for suicide?: No Suicidal Plan?: No Access to Means: No What has been your use of drugs/alcohol within the last 12 months?:  (per brother... "Patient is a former alcoholic") How many times?:  (0) Other Self Harm Risks:  (no self harm ) Triggers for Past Attempts: Other (Comment) (no triggers for past attempts or gestures ) Intentional Self Injurious Behavior: None Risk to Others: Homicidal Ideation: No Thoughts of Harm to Others: No Current Homicidal Intent: No Current Homicidal Plan: No Access to Homicidal Means: No Identified Victim:  (n/a) History of harm to others?: No Assessment of Violence: None Noted Violent Behavior  Description:  (patient is calm and cooperative ) Does patient have access to weapons?: No Criminal Charges Pending?: No Does patient have a court date: No Prior Inpatient Therapy:   Prior Outpatient Therapy: Prior Outpatient Therapy: No Prior Therapy Dates:  (n/a) Prior Therapy Facilty/Provider(s):  (n/a) Reason for Treatment:  (n/a') Does patient have an ACCT team?: No Does patient have Intensive In-House Services?  : No Does patient have [facility] services? : No Does patient have P4CC services?: No  Past Medical History:  Past Medical History:  Diagnosis Date  . A-fib ([place])   . Anxiety   . Dementia   . Depression   . Hypertension    History reviewed. No pertinent surgical history. Family History:  Family History  Problem Relation Age of Onset  . Family history unknown: Yes   Family Psychiatric  History: Unknown Social History:  History  Alcohol Use No     History  Drug Use No    Social History   Social History  . Marital status: Unknown    Spouse name: N/A  . Number of children: N/A  . Years of education: N/A   Social History Main Topics  . Smoking status: Unknown If Ever Smoked  . Smokeless tobacco: Current User    Types: Snuff, Chew  . Alcohol use No  . Drug use: No  . Sexual activity: No   Other Topics Concern  . None   Social History Narrative  . None   Additional Social History:    Allergies:   Allergies  Allergen Reactions  . Bacitracin Other (See Comments)    Reaction unknown per MAR  . Other Other (See Comments)  HMG-COA reductase inhibitors - reaction unknown per MAR  . Penicillins Other (See Comments)    Reaction unknown per MAR  . Statins Other (See Comments)    Reaction unknown per [facility]    Labs:  Results for orders placed or performed during the hospital encounter of 04/10/17 (from the past 48 hour(s))  CBC with Differential     Status: Abnormal   Collection Time: 04/10/17  6:33 PM  Result Value Ref Range   WBC 6.0 4.0 - 10.5  K/uL   RBC 4.56 3.87 - 5.11 MIL/uL   Hemoglobin 14.0 12.0 - 15.0 g/dL   HCT 43.1 36.0 - 46.0 %   MCV 94.5 78.0 - 100.0 fL   MCH 30.7 26.0 - 34.0 pg   MCHC 32.5 30.0 - 36.0 g/dL   RDW 14.9 11.5 - 15.5 %   Platelets 146 (L) 150 - 400 K/uL   Neutrophils Relative % 40 %   Neutro Abs 2.4 1.7 - 7.7 K/uL   Lymphocytes Relative 41 %   Lymphs Abs 2.5 0.7 - 4.0 K/uL   Monocytes Relative 13 %   Monocytes Absolute 0.8 0.1 - 1.0 K/uL   Eosinophils Relative 5 %   Eosinophils Absolute 0.3 0.0 - 0.7 K/uL   Basophils Relative 1 %   Basophils Absolute 0.0 0.0 - 0.1 K/uL  Comprehensive metabolic panel     Status: Abnormal   Collection Time: 04/10/17  6:33 PM  Result Value Ref Range   Sodium 142 135 - 145 mmol/L   Potassium 3.6 3.5 - 5.1 mmol/L   Chloride 105 101 - 111 mmol/L   CO2 30 22 - 32 mmol/L   Glucose, Bld 92 65 - 99 mg/dL   BUN 17 6 - 20 mg/dL   Creatinine, Ser 1.12 (H) 0.44 - 1.00 mg/dL   Calcium 8.9 8.9 - 10.3 mg/dL   Total Protein 6.4 (L) 6.5 - 8.1 g/dL   Albumin 3.3 (L) 3.5 - 5.0 g/dL   AST 17 15 - 41 U/L   ALT 14 14 - 54 U/L   Alkaline Phosphatase 86 38 - 126 U/L   Total Bilirubin 0.6 0.3 - 1.2 mg/dL   GFR calc non Af Amer 48 (L) >60 mL/min   GFR calc Af Amer 55 (L) >60 mL/min    Comment: (NOTE) The eGFR has been calculated using the CKD EPI equation. This calculation has not been validated in all clinical situations. eGFR's persistently <60 mL/min signify possible Chronic Kidney Disease.    Anion gap 7 5 - 15  Blood gas, venous     Status: Abnormal   Collection Time: 04/10/17  6:33 PM  Result Value Ref Range   pH, Ven 7.424 7.250 - 7.430   pCO2, Ven 46.9 44.0 - 60.0 mmHg   pO2, Ven  32.0 - 45.0 mmHg    CRITICAL RESULT CALLED TO, READ BACK BY AND VERIFIED WITH:    Comment: BELOW REPORTABLE RANGE CRITICAL RESULT CALLED TO, READ BACK BY AND VERIFIED WITH: DR.FLOYD,MD BY LISA CRADDOCK RRT,RCP AT 1845 ON 04/10/17    Bicarbonate 30.2 (H) 20.0 - 28.0 mmol/L   Acid-Base  Excess 5.3 (H) 0.0 - 2.0 mmol/L   O2 Saturation 49.4 %   Patient temperature 98.6    Collection site VEIN    Drawn by COLLECTED BY LABORATORY    Sample type VEIN   Ammonia     Status: Abnormal   Collection Time: 04/10/17  6:33 PM  Result Value Ref Range  Ammonia 38 (H) 9 - 35 umol/L  CBG monitoring, ED     Status: None   Collection Time: 04/10/17  6:33 PM  Result Value Ref Range   Glucose-Capillary 84 65 - 99 mg/dL  Valproic acid level     Status: None   Collection Time: 04/10/17  6:33 PM  Result Value Ref Range   Valproic Acid Lvl 58 50.0 - 100.0 ug/mL  Urinalysis, Routine w reflex microscopic     Status: Abnormal   Collection Time: 04/10/17  6:58 PM  Result Value Ref Range   Color, Urine YELLOW YELLOW   APPearance CLEAR CLEAR   Specific Gravity, Urine 1.019 1.005 - 1.030   pH 6.0 5.0 - 8.0   Glucose, UA NEGATIVE NEGATIVE mg/dL   Hgb urine dipstick NEGATIVE NEGATIVE   Bilirubin Urine NEGATIVE NEGATIVE   Ketones, ur 5 (A) NEGATIVE mg/dL   Protein, ur NEGATIVE NEGATIVE mg/dL   Nitrite NEGATIVE NEGATIVE   Leukocytes, UA TRACE (A) NEGATIVE   RBC / HPF 0-5 0 - 5 RBC/hpf   WBC, UA 0-5 0 - 5 WBC/hpf   Bacteria, UA RARE (A) NONE SEEN   Squamous Epithelial / LPF 0-5 (A) NONE SEEN   Mucus PRESENT     Current Facility-Administered Medications  Medication Dose Route Frequency Provider Last Rate Last Dose  . asenapine (SAPHRIS) sublingual tablet 5 mg  5 mg Sublingual Q12H PRN Terrez Ander, MD      . azithromycin (ZITHROMAX) tablet 250 mg  250 mg Oral Daily Melene Plan, DO   250 mg at 04/11/17 1050  . busPIRone (BUSPAR) tablet 7.5 mg  7.5 mg Oral BID Dagen Beevers, MD      . cephALEXin (KEFLEX) capsule 500 mg  500 mg Oral Q12H Floyd, Dan, DO   500 mg at 04/11/17 1050  . divalproex (DEPAKOTE SPRINKLE) capsule 250 mg  250 mg Oral TID Tresean Mattix, MD      . memantine (NAMENDA) tablet 10 mg  10 mg Oral BID Delaney Perona, MD      . metoprolol succinate (TOPROL-XL) 24 hr  tablet 100 mg  100 mg Oral Daily Wilbon Obenchain, MD      . traZODone (DESYREL) tablet 50 mg  50 mg Oral QHS Thedore Mins, MD       Current Outpatient Prescriptions  Medication Sig Dispense Refill  . albuterol (VENTOLIN HFA) 108 (90 Base) MCG/ACT inhaler Inhale 2 puffs into the lungs every 6 (six) hours as needed for wheezing or shortness of breath.    Marland Kitchen apixaban (ELIQUIS) 5 MG TABS tablet Take 5 mg by mouth 2 (two) times daily.    . busPIRone (BUSPAR) 15 MG tablet Take 7.5 mg by mouth 2 (two) times daily.    Marland Kitchen diltiazem (TIAZAC) 300 MG 24 hr capsule Take 300 mg by mouth daily.    . divalproex (DEPAKOTE SPRINKLE) 125 MG capsule Take 250 mg by mouth 3 (three) times daily.    . furosemide (LASIX) 40 MG tablet Take 40 mg by mouth daily.    Marland Kitchen LORazepam (ATIVAN) 0.5 MG tablet Take 0.5 mg by mouth every 8 (eight) hours.    . Melatonin 10 MG CAPS Take 1 capsule by mouth at bedtime.    . memantine (NAMENDA) 10 MG tablet Take 10 mg by mouth 2 (two) times daily.    . metoprolol succinate (TOPROL-XL) 100 MG 24 hr tablet Take 100 mg by mouth daily. Take with or immediately following a meal.    . Nutritional  Supplements (NUTRA/SHAKE FREE PO) Take 237 mLs by mouth 2 (two) times daily.    Marland Kitchen nystatin (MYCOSTATIN/NYSTOP) powder Apply 1 Bottle topically 2 (two) times daily.    . Omega-3 Fatty Acids (FISH OIL) 1000 MG CAPS Take 2 capsules by mouth 2 (two) times daily.    . potassium chloride SA (K-DUR,KLOR-CON) 20 MEQ tablet Take 20 mEq by mouth daily.    . ranitidine (ZANTAC) 150 MG tablet Take 150 mg by mouth daily.    . traZODone (DESYREL) 50 MG tablet Take 50 mg by mouth at bedtime.    Marland Kitchen alum & mag hydroxide-simeth (MAALOX/MYLANTA) 200-200-20 MG/5ML suspension Take 30 mLs by mouth every 6 (six) hours as needed for indigestion or heartburn (dyspepsia). (Patient not taking: Reported on 04/10/2017) 355 mL 0  . clindamycin (CLEOCIN) 300 MG capsule Take 1 capsule (300 mg total) by mouth every 6 (six) hours.  (Patient not taking: Reported on 04/10/2017) 16 capsule 0  . memantine (NAMENDA) 5 MG tablet Take 1 tablet (5 mg total) by mouth daily. (Patient not taking: Reported on 04/10/2017)    . metoprolol tartrate (LOPRESSOR) 25 MG tablet Take 0.5 tablets (12.5 mg total) by mouth 2 (two) times daily. (Patient not taking: Reported on 04/10/2017)      Musculoskeletal: Strength & Muscle Tone: within normal limits Gait & Station: normal Patient leans: N/A  Psychiatric Specialty Exam: Physical Exam  Constitutional: She appears well-developed.  HENT:  Head: Normocephalic.  Respiratory: Effort normal.  Psychiatric: She is agitated and aggressive. She expresses inappropriate judgment. She exhibits a depressed mood. She is noncommunicative. She exhibits abnormal recent memory and abnormal remote memory.    Review of Systems  Psychiatric/Behavioral: Positive for depression and memory loss (advanced dementia). Negative for hallucinations, substance abuse and suicidal ideas. The patient is not nervous/anxious and does not have insomnia.   All other systems reviewed and are negative.   Blood pressure (!) 179/101, pulse 70, temperature 97.6 F (36.4 C), temperature source Oral, resp. rate 18, SpO2 98 %.There is no height or weight on file to calculate BMI.  General Appearance: Disheveled  Eye Contact:  Fair  Speech:  Slow and nonsensical  Volume:  Normal  Mood:  Depressed and aggitated  Affect:  dementia  Thought Process:  Disorganized  Orientation:  Other:  Dementia  Thought Content:  Dementia  Suicidal Thoughts:  No  Homicidal Thoughts:  No  Memory:  Dementia  Judgement:  Other:  Dementia  Insight:  Dementia  Psychomotor Activity:  Increased  Concentration:  Concentration: Dementia and Attention Span: Dementia  Recall:  Dementia  Fund of Knowledge:  Dementia  Language:  Fair  Akathisia:  No  Handed:  Right  AIMS (if indicated):     Assets:  Financial Resources/Insurance Housing Social Support   ADL's:  Impaired, dementia  Cognition:  Impaired,  Severe  Sleep:        Treatment Plan Summary: Daily contact with patient to assess and evaluate symptoms and progress in treatment and Medication management (see MAR)  Disposition: Recommend psychiatric Inpatient admission when medically cleared.TTS to seek placement  Ethelene Hal, NP 04/11/2017 12:04 PM  Patient seen face-to-face for psychiatric evaluation, chart reviewed and case discussed with the physician extender and developed treatment plan. Reviewed the information documented and agree with the treatment plan. Corena Pilgrim, MD

## 2017-04-11 NOTE — ED Notes (Signed)
Bed: WA20 Expected date:  Expected time:  Means of arrival:  Comments: Tcu 28

## 2017-04-11 NOTE — ED Provider Notes (Signed)
Behavior[facility] Health [facility] Dr Randa Evens[facility].  Patient alert, content appearing, nad.   Vitals:   04/11/17 0646 04/11/17 1309  BP: (!) 179/101 133/90  Pulse: 70 63  Resp:  18  Temp:  98.1 F (36.7 C)  SpO2: 98% 92%      Cathren LaineSteinl, Chelsei Mcchesney, MD 04/11/17 1439

## 2017-04-11 NOTE — BH Assessment (Signed)
[facility] Assessment Progress Note  Per Thedore MinsMojeed Akintayo, MD, this pt requires psychiatric hospitalization at this time.  He also finds that pt meets criteria for IVC, which he has initiated.  [place] [facility] [facility] [place] [facility], and at [employer]12:10 Magistrate Morton confirms receipt.  Findings and Custody Order has since been served.   At 14:01 [name] calls [facility] to report that pt has been accepted to their facility by Dr Tonna CornerJessica Edwards to Rm [id number].  EDP Cathren LaineKevin Steinl, MD concurs with this decision.  Pt's nurse, Ginger, has been notified, and agrees to call report to [phone].  [facility]  Sheryl Canninghomas Pratik Dalziel, MA Triage Specialist [phone]

## 2017-04-11 NOTE — Progress Notes (Signed)
Pharmacy Antibiotic Note  Sheryl Horn is a 71 y.o. female admitted on 04/10/2017 with pneumonia.  Pharmacy has been consulted for rocephin dosing.  Plan: Rocephin 1 gm IV q24h Zmax 500 mg IV q24h (MD) Rx will sign off as no further adjustments necessary  Weight: 137 lb 3.2 oz (62.2 kg)  Temp (24hrs), Avg:98.6 F (37 C), Min:97.3 F (36.3 C), Max:100.3 F (37.9 C)   Recent Labs Lab 04/10/17 1833 04/11/17 1803  WBC 6.0  --   CREATININE 1.12* 0.90    CrCl cannot be calculated (Unknown ideal weight.).    Allergies  Allergen Reactions  . Bacitracin Other (See Comments)    Reaction unknown per MAR  . Other Other (See Comments)    HMG-COA reductase inhibitors - reaction unknown per MAR  . Penicillins Other (See Comments)    Reaction unknown per MAR  . Statins Other (See Comments)    [facility]    Antimicrobials this admission: 9/6 rocephin >>  9/6 zmax >>   Dose adjustments this admission:   Microbiology results:  BCx:   UCx:    Sputum:    MRSA PCR:   Thank you for allowing pharmacy to be a part of this patient's care.  Sheryl Horn, Sheryl Horn 04/11/2017 11:31 PM

## 2017-04-11 NOTE — ED Provider Notes (Addendum)
I was notified that pt is tachycardic.  EKG reviewed.  Pt is in a fib RVR.  She has a history of a fib.   According to her med rec she is supposed to be taking cardizem PO.  Pt was given metoprolol today but not her cardizem.   Will give a dose of IV metoprolol and her po cardizem.  Place on cardiac monitors and observe.   EKG Interpretation  Date/Time:  Thursday April 11 2017 17:22:36 EDT Ventricular Rate:  162 PR Interval:    QRS Duration: 94 QT Interval:  278 QTC Calculation: 456 R Axis:   7 Text Interpretation:  Atrial fibrillation with rapid ventricular response Marked ST abnormality, possible inferior subendocardial injury Abnormal ECG rate faster since last tracing Confirmed by Linwood DibblesKnapp, Zofia Peckinpaugh [phone]([id number]) on 04/11/2017 5:28:01 PM         Linwood DibblesKnapp, Kedrick Mcnamee, MD 04/11/17 1752  HR better but still tachycardic after metoprolol.  Will start esmolol drip   Linwood DibblesKnapp, Kiran Carline, MD 04/11/17 1859  Heart rate still tachycardic.   Esmolol is infusing.  Will titrate drip.    Linwood DibblesKnapp, Keitra Carusone, MD 04/11/17 [phone]

## 2017-04-12 ENCOUNTER — Encounter (HOSPITAL_COMMUNITY): Payer: Self-pay

## 2017-04-12 DIAGNOSIS — R410 Disorientation, unspecified: Secondary | ICD-10-CM | POA: Diagnosis present

## 2017-04-12 DIAGNOSIS — Z880 Allergy status to penicillin: Secondary | ICD-10-CM | POA: Diagnosis not present

## 2017-04-12 DIAGNOSIS — F17220 Nicotine dependence, chewing tobacco, uncomplicated: Secondary | ICD-10-CM | POA: Diagnosis present

## 2017-04-12 DIAGNOSIS — R6520 Severe sepsis without septic shock: Secondary | ICD-10-CM | POA: Diagnosis present

## 2017-04-12 DIAGNOSIS — I639 Cerebral infarction, unspecified: Secondary | ICD-10-CM | POA: Diagnosis present

## 2017-04-12 DIAGNOSIS — Z79899 Other long term (current) drug therapy: Secondary | ICD-10-CM | POA: Diagnosis not present

## 2017-04-12 DIAGNOSIS — R413 Other amnesia: Secondary | ICD-10-CM | POA: Diagnosis not present

## 2017-04-12 DIAGNOSIS — I5042 Chronic combined systolic (congestive) and diastolic (congestive) heart failure: Secondary | ICD-10-CM | POA: Diagnosis present

## 2017-04-12 DIAGNOSIS — R451 Restlessness and agitation: Secondary | ICD-10-CM | POA: Diagnosis not present

## 2017-04-12 DIAGNOSIS — Z23 Encounter for immunization: Secondary | ICD-10-CM | POA: Diagnosis present

## 2017-04-12 DIAGNOSIS — I110 Hypertensive heart disease with heart failure: Secondary | ICD-10-CM | POA: Diagnosis present

## 2017-04-12 DIAGNOSIS — Z7901 Long term (current) use of anticoagulants: Secondary | ICD-10-CM | POA: Diagnosis not present

## 2017-04-12 DIAGNOSIS — J189 Pneumonia, unspecified organism: Secondary | ICD-10-CM | POA: Diagnosis present

## 2017-04-12 DIAGNOSIS — I340 Nonrheumatic mitral (valve) insufficiency: Secondary | ICD-10-CM | POA: Diagnosis not present

## 2017-04-12 DIAGNOSIS — I4891 Unspecified atrial fibrillation: Secondary | ICD-10-CM | POA: Diagnosis not present

## 2017-04-12 DIAGNOSIS — F319 Bipolar disorder, unspecified: Secondary | ICD-10-CM | POA: Diagnosis present

## 2017-04-12 DIAGNOSIS — G309 Alzheimer's disease, unspecified: Secondary | ICD-10-CM | POA: Diagnosis present

## 2017-04-12 DIAGNOSIS — F0391 Unspecified dementia with behavioral disturbance: Secondary | ICD-10-CM | POA: Diagnosis not present

## 2017-04-12 DIAGNOSIS — A419 Sepsis, unspecified organism: Secondary | ICD-10-CM | POA: Diagnosis present

## 2017-04-12 DIAGNOSIS — G4700 Insomnia, unspecified: Secondary | ICD-10-CM | POA: Diagnosis not present

## 2017-04-12 DIAGNOSIS — I482 Chronic atrial fibrillation: Secondary | ICD-10-CM | POA: Diagnosis present

## 2017-04-12 DIAGNOSIS — I63442 Cerebral infarction due to embolism of left cerebellar artery: Secondary | ICD-10-CM | POA: Diagnosis not present

## 2017-04-12 DIAGNOSIS — F0281 Dementia in other diseases classified elsewhere with behavioral disturbance: Secondary | ICD-10-CM | POA: Diagnosis present

## 2017-04-12 DIAGNOSIS — J9601 Acute respiratory failure with hypoxia: Secondary | ICD-10-CM | POA: Diagnosis present

## 2017-04-12 DIAGNOSIS — N179 Acute kidney failure, unspecified: Secondary | ICD-10-CM | POA: Diagnosis present

## 2017-04-12 DIAGNOSIS — I700 Atherosclerosis of aorta: Secondary | ICD-10-CM | POA: Diagnosis present

## 2017-04-12 DIAGNOSIS — E860 Dehydration: Secondary | ICD-10-CM | POA: Diagnosis present

## 2017-04-12 DIAGNOSIS — Z888 Allergy status to other drugs, medicaments and biological substances status: Secondary | ICD-10-CM | POA: Diagnosis not present

## 2017-04-12 DIAGNOSIS — I10 Essential (primary) hypertension: Secondary | ICD-10-CM

## 2017-04-12 DIAGNOSIS — F39 Unspecified mood [affective] disorder: Secondary | ICD-10-CM | POA: Diagnosis not present

## 2017-04-12 DIAGNOSIS — D696 Thrombocytopenia, unspecified: Secondary | ICD-10-CM | POA: Diagnosis present

## 2017-04-12 LAB — CBC
HCT: 43.3 % (ref 36.0–46.0)
HEMOGLOBIN: 14.3 g/dL (ref 12.0–15.0)
MCH: 30.7 pg (ref 26.0–34.0)
MCHC: 33 g/dL (ref 30.0–36.0)
MCV: 92.9 fL (ref 78.0–100.0)
PLATELETS: 138 10*3/uL — AB (ref 150–400)
RBC: 4.66 MIL/uL (ref 3.87–5.11)
RDW: 15.1 % (ref 11.5–15.5)
WBC: 7.9 10*3/uL (ref 4.0–10.5)

## 2017-04-12 LAB — PROCALCITONIN: Procalcitonin: <0.1 ng/mL

## 2017-04-12 LAB — BASIC METABOLIC PANEL
ANION GAP: 10 (ref 5–15)
BUN: 12 mg/dL (ref 6–20)
CALCIUM: 8.4 mg/dL — AB (ref 8.9–10.3)
CO2: 23 mmol/L (ref 22–32)
CREATININE: 1.06 mg/dL — AB (ref 0.44–1.00)
Chloride: 110 mmol/L (ref 101–111)
GFR, EST AFRICAN AMERICAN: 59 mL/min — AB (ref >60)
GFR, EST NON AFRICAN AMERICAN: 51 mL/min — AB (ref >60)
Glucose, Bld: 166 mg/dL — ABNORMAL HIGH (ref 65–99)
Potassium: 3.5 mmol/L (ref 3.5–5.1)
SODIUM: 143 mmol/L (ref 135–145)

## 2017-04-12 LAB — TROPONIN I: Troponin I: 0.06 ng/mL (ref <0.03)

## 2017-04-12 LAB — LACTIC ACID, PLASMA: LACTIC ACID, VENOUS: 2.2 mmol/L — AB (ref 0.5–1.9)

## 2017-04-12 LAB — CORTISOL: Cortisol, Plasma: 14.7 ug/dL

## 2017-04-12 LAB — TSH: TSH: 4.188 u[IU]/mL (ref 0.350–4.500)

## 2017-04-12 LAB — VALPROIC ACID LEVEL: VALPROIC ACID LVL: 49 ug/mL — AB (ref 50.0–100.0)

## 2017-04-12 LAB — MRSA PCR SCREENING: MRSA BY PCR: NEGATIVE

## 2017-04-12 MED ORDER — METOPROLOL TARTRATE 25 MG PO TABS
25.0000 mg | ORAL_TABLET | Freq: Once | ORAL | Status: AC
  Administered 2017-04-12: 25 mg via ORAL
  Filled 2017-04-12: qty 1

## 2017-04-12 MED ORDER — SODIUM CHLORIDE 0.9 % IV BOLUS (SEPSIS)
500.0000 mL | Freq: Once | INTRAVENOUS | Status: AC
  Administered 2017-04-12: 500 mL via INTRAVENOUS

## 2017-04-12 MED ORDER — DILTIAZEM HCL 60 MG PO TABS
60.0000 mg | ORAL_TABLET | Freq: Four times a day (QID) | ORAL | Status: DC
  Administered 2017-04-12 (×2): 60 mg via ORAL
  Filled 2017-04-12 (×2): qty 1

## 2017-04-12 MED ORDER — METOPROLOL TARTRATE 5 MG/5ML IV SOLN
5.0000 mg | INTRAVENOUS | Status: DC | PRN
  Administered 2017-04-12 – 2017-04-13 (×2): 5 mg via INTRAVENOUS
  Filled 2017-04-12 (×2): qty 5

## 2017-04-12 MED ORDER — SODIUM CHLORIDE 0.9 % IV BOLUS (SEPSIS)
250.0000 mL | INTRAVENOUS | Status: AC | PRN
  Administered 2017-04-12 (×3): 250 mL via INTRAVENOUS

## 2017-04-12 MED ORDER — SODIUM CHLORIDE 0.9 % IV SOLN
INTRAVENOUS | Status: DC
  Administered 2017-04-13 – 2017-04-16 (×5): via INTRAVENOUS

## 2017-04-12 MED ORDER — AZITHROMYCIN 250 MG PO TABS
500.0000 mg | ORAL_TABLET | Freq: Every day | ORAL | Status: DC
  Administered 2017-04-13 – 2017-04-16 (×4): 500 mg via ORAL
  Filled 2017-04-12 (×4): qty 2

## 2017-04-12 MED ORDER — METOPROLOL TARTRATE 25 MG PO TABS: 50.0000 mg | ORAL_TABLET | Freq: Two times a day (BID) | ORAL | Status: DC

## 2017-04-12 MED ORDER — METOPROLOL TARTRATE 25 MG PO TABS
25.0000 mg | ORAL_TABLET | Freq: Two times a day (BID) | ORAL | Status: DC
  Administered 2017-04-12: 25 mg via ORAL
  Filled 2017-04-12: qty 1

## 2017-04-12 MED ORDER — SODIUM CHLORIDE 0.9 % IV SOLN
INTRAVENOUS | Status: AC
  Administered 2017-04-12: 02:00:00 via INTRAVENOUS

## 2017-04-12 MED ORDER — METOPROLOL SUCCINATE ER 25 MG PO TB24
25.0000 mg | ORAL_TABLET | Freq: Every day | ORAL | Status: DC
  Filled 2017-04-12: qty 1

## 2017-04-12 MED ORDER — DILTIAZEM HCL-DEXTROSE 100-5 MG/100ML-% IV SOLN (PREMIX)
5.0000 mg/h | INTRAVENOUS | Status: AC
  Administered 2017-04-12: 7.5 mg/h via INTRAVENOUS
  Administered 2017-04-13 (×2): 15 mg/h via INTRAVENOUS
  Administered 2017-04-13: 10 mg/h via INTRAVENOUS
  Administered 2017-04-13: 15 mg/h via INTRAVENOUS
  Administered 2017-04-14: 10 mg/h via INTRAVENOUS
  Administered 2017-04-14: 12.5 mg/h via INTRAVENOUS
  Filled 2017-04-12 (×7): qty 100

## 2017-04-12 NOTE — Progress Notes (Addendum)
BP still soft 71/27 with pulse in 50s. Esmolol stopped. Paged MD. New orders placed. Continue to monitor.

## 2017-04-12 NOTE — Progress Notes (Signed)
Initial Nutrition Assessment  DOCUMENTATION CODES:   Not applicable  INTERVENTION:  - Will order Magic Cup TID with meals, each supplement provides 290 kcal and 9 grams of protein - Continue to assist with meals and encourage PO intakes.   NUTRITION DIAGNOSIS:   Inadequate oral intake related to lethargy/confusion (and agitation) as evidenced by  (per sitter's report).  GOAL:   Patient will meet greater than or equal to 90% of their needs  MONITOR:   PO intake, Supplement acceptance, Weight trends, Labs  REASON FOR ASSESSMENT:   Malnutrition Screening Tool  ASSESSMENT:   71 y.o. female with history of advanced dementia was brought to the ER with increasing confusion and behavioral disturbance. In the ER patient was seen by behavioral health team and advised inpatient psychiatric admission and [facility] Patient was waiting for transfer when patient's heart rate increased and was found to be in A. fib with RVR. Patient has advanced dementia  Pt seen for MST. BMI indicates normal weight status. Pt is disoriented x4 with no family/visitors at bedside. Pt has a Comptrollersitter, who provides all information. Pt has dentures but these were not put in yesterday d/t agitation but pt was able to tolerate soft foods. She did not consume breakfast 9/6 d/t agitation, ate well for lunch, and flow sheet shows that she ate carrots and a cup of ice cream for dinner last night. Sitter had ordered breakfast shortly before RD visit (applesauce, oatmeal, and scrambled eggs). Sitter stated that she should have also ordered [employer] as she feels that pt would do well with this item; this RD called and added this item to the breakfast tray.   Physical assessment shows no muscle and no fat wasting; unable to assess back or hands (due to bilateral mitten restraints). No weight hx available in the chart since November 2016.  Medications reviewed; 20 mg oral Pepcid/day, 20 mEq  oral KCl/day. Labs reviewed; creatinine: 1.06 mg/dL, Ca: 8.4 mg/dL, ionized Ca: 9.521.06 mmol/L, GFR: 51 mL/min.   IVF: NS @ 125 mL/hr.    Diet Order:  Diet Heart Room service appropriate? Yes; Fluid consistency: Thin  Skin:  Reviewed, no issues  Last BM:  PTA/unknown  Height:   Ht Readings from Last 1 Encounters:  04/12/17 5\' 6"  (1.676 m)    Weight:   Wt Readings from Last 1 Encounters:  04/12/17 137 lb 2 oz (62.2 kg)    Ideal Body Weight:  59.09 kg  BMI:  Body mass index is 22.13 kg/m.  Estimated Nutritional Needs:   Kcal:  1244-1495 (20-24 kcal/kg)  Protein:  50-62 grams (0.8-1 grams/kg)  Fluid:  >/= 1.4 L/day  EDUCATION NEEDS:   No education needs identified at this time    Trenton GammonJessica Pasha Gadison, MS, RD, LDN, CNSC Inpatient Clinical Dietitian Pager # [phone] After hours/weekend pager # [phone]

## 2017-04-12 NOTE — Progress Notes (Signed)
CSW following to assist with [facility] when medically stable for inpatient psychiatric treatment.   Vivi BarrackNicole Hildreth Robart, Theresia MajorsLCSWA, MSW Clinical Social Worker 5E and Psychiatric Service Line [phone] 04/12/2017  10:50 AM

## 2017-04-12 NOTE — Progress Notes (Signed)
CRITICAL VALUE ALERT  Critical Value:  Lactic acid 2.2  Date & Time Notied:  04/12/17 [id number]  Provider Notified: Dr. Antionette Charpyd  Orders Received/Actions taken:

## 2017-04-12 NOTE — Progress Notes (Addendum)
BP soft 88/38 after additional 250 ml NS bolus per order. HR staying between 50s & 60s. Paged MD. New orders placed for labs, 500 cc NS bolus, increase fluids to 12925ml/hr. Continue to monitor.

## 2017-04-12 NOTE — Progress Notes (Signed)
Patient's HR was noted to be maintaining in the 150's-170's.  MD made aware, orders placed for scheduled and PRN Metoprolol to be administered, as well as an extra dose of Metoprolol to be given.  Extra dose provided.   MD also paged about patient's temperature being 101 axillary.  PRN Tylenol administered for fever.  Will continue to monitor.

## 2017-04-12 NOTE — Progress Notes (Signed)
PROGRESS NOTE  Sheryl Horn ZOX:[id number] DOB: 02-15-1946 DOA: 04/10/2017 PCP: System, Provider Not In   LOS: 0 days   Brief Narrative / Interim history: Sheryl Horn is a 71 y.o. female with history of advanced dementia was brought to the ER with increasing confusion and behavioral disturbance. In the ER patient was seen by behavioral health team and advised inpatient psychiatric admission and patient was accepted at [facility]. Patient was waiting for transfer when patient's heart rate increased and was found to be in A. fib with RVR. Patient has advanced dementia and does not provide any history. Patient has a known history of A. fib and is on metoprolol and Cardizem for rate control and on Apixaban.  Assessment & Plan: Principal Problem:   Atrial fibrillation with RVR (HCC) Active Problems:   HTN (hypertension)   Dementia with behavioral disturbance   Atrial fibrillation with rapid ventricular response (HCC)   A. fib with RVR -Patient's rate is improved this morning, she is off of the S morning infusion.  Discontinue thought, resume her Cardizem as well as metoprolol.  Keep monitoring in stepdown.  She is already on anticoagulation with apixaban.   -Metoprolol was started at a lower dose, will up titrate if needed. -Slight troponin elevation likely in the setting of demand  Concern for pneumonia -started on ceftriaxone and azithromycin in the emergency room, continue for now.  Acute kidney injury -Likely due to dehydration, continue IV fluids, renal function overall stable this morning.  Dementia with behavioral disturbances -Will need psych placement once medical issues are improved  Hypertension -Closely monitor, she was hypotensive overnight however this morning her blood pressure is improving.  Mild thrombocytopenia -Stable, follow CBC  DVT prophylaxis: Apixaban Code Status: Full code Family Communication: No family at bedside Disposition Plan: Inpatient  psychiatry when medically ready  Consultants:   None   Procedures:   None   Antimicrobials:  Ceftriaxone 9/6 >>  Azithromycin 9/6 >>  Subjective: - no chest pain, shortness of breath, no abdominal pain, nausea or vomiting.   Objective: Vitals:   04/12/17 0800 04/12/17 0812 04/12/17 1000 04/12/17 1100  BP:  (!) 180/82 (!) 155/80 (!) 146/67  Pulse:  91 (!) 110 (!) 114  Resp:  20 (!) 21 (!) 26  Temp: (!) 97.5 F (36.4 C)     TempSrc: Axillary     SpO2:  90% 90% (!) 88%  Weight:      Height:        Intake/Output Summary (Last 24 hours) at 04/12/17 1130 Last data filed at 04/12/17 1100  Gross per 24 hour  Intake          2683.13 ml  Output             1050 ml  Net          1633.13 ml   Filed Weights   04/11/17 1913 04/12/17 0701  Weight: 62.2 kg (137 lb 3.2 oz) 62.2 kg (137 lb 2 oz)    Examination:  Vitals:   04/12/17 0800 04/12/17 0812 04/12/17 1000 04/12/17 1100  BP:  (!) 180/82 (!) 155/80 (!) 146/67  Pulse:  91 (!) 110 (!) 114  Resp:  20 (!) 21 (!) 26  Temp: (!) 97.5 F (36.4 C)     TempSrc: Axillary     SpO2:  90% 90% (!) 88%  Weight:      Height:        Constitutional: NAD, confused ENMT: Mucous membranes are moist.  Neck: normal, supple Respiratory: clear to auscultation bilaterally, no wheezing, no crackles. Normal respiratory effort. No accessory muscle use.  Cardiovascular: Irregular, no murmurs.  Good peripheral pulses.  No lower extremity edema. Abdomen: no tenderness. Bowel sounds positive.  Skin: no rashes, lesions, ulcers. No induration Neurologic: CN 2-12 grossly intact. Strength 5/5 in all 4.    Data Reviewed: I have independently reviewed following labs and imaging studies   CBC:  Recent Labs Lab 04/10/17 1833 04/11/17 1803 04/12/17 0327  WBC 6.0  --  7.9  NEUTROABS 2.4  --   --   HGB 14.0 17.7* 14.3  HCT 43.1 52.0* 43.3  MCV 94.5  --  92.9  PLT 146*  --  138*   Basic Metabolic Panel:  Recent Labs Lab 04/10/17 1833  04/11/17 1803 04/12/17 0327  NA 142 147* 143  K 3.6 3.3* 3.5  CL 105 109 110  CO2 30  --  23  GLUCOSE 92 126* 166*  BUN CREATININE 1.12* 0.90 1.06*  CALCIUM 8.9  --  8.4*   GFR: Estimated Creatinine Clearance: 44.2 mL/min (A) (by C-G formula based on SCr of 1.06 mg/dL (H)). Liver Function Tests:  Recent Labs Lab 04/10/17 1833  AST 17  ALT 14  ALKPHOS 86  BILITOT 0.6  PROT 6.4*  ALBUMIN 3.3*   No results for input(s): LIPASE, AMYLASE in the last 168 hours.  Recent Labs Lab 04/10/17 1833  AMMONIA 38*   Coagulation Profile: No results for input(s): INR, PROTIME in the last 168 hours. Cardiac Enzymes:  Recent Labs Lab 04/12/17 0548  TROPONINI 0.06*   BNP (last 3 results) No results for input(s): PROBNP in the last 8760 hours. HbA1C: No results for input(s): HGBA1C in the last 72 hours. CBG:  Recent Labs Lab 04/10/17 1833  GLUCAP 84   Lipid Profile: No results for input(s): CHOL, HDL, LDLCALC, TRIG, CHOLHDL, LDLDIRECT in the last 72 hours. Thyroid Function Tests:  Recent Labs  04/11/17 2349  TSH 4.188   Anemia Panel: No results for input(s): VITAMINB12, FOLATE, FERRITIN, TIBC, IRON, RETICCTPCT in the last 72 hours. Urine analysis:    Component Value Date/Time   COLORURINE YELLOW 04/10/2017 1858   APPEARANCEUR CLEAR 04/10/2017 1858   LABSPEC 1.019 04/10/2017 1858   PHURINE 6.0 04/10/2017 1858   GLUCOSEU NEGATIVE 04/10/2017 1858   HGBUR NEGATIVE 04/10/2017 1858   BILIRUBINUR NEGATIVE 04/10/2017 1858   KETONESUR 5 (A) 04/10/2017 1858   PROTEINUR NEGATIVE 04/10/2017 1858   UROBILINOGEN 2.0 (H) 06/10/2015 1724   NITRITE NEGATIVE 04/10/2017 1858   LEUKOCYTESUR TRACE (A) 04/10/2017 1858   Sepsis Labs: Invalid input(s): PROCALCITONIN, LACTICIDVEN  Recent Results (from the past 240 hour(s))  MRSA PCR Screening     Status: None   Collection Time: 04/11/17 10:30 PM  Result Value Ref Range Status   MRSA by PCR NEGATIVE NEGATIVE Final     Comment:        The GeneXpert MRSA Assay (FDA approved for NASAL specimens only), is one component of a comprehensive MRSA colonization surveillance program. It is not intended to diagnose MRSA infection nor to guide or monitor treatment for MRSA infections.       Radiology Studies: Dg Chest 2 View  Result Date: 04/10/2017 CLINICAL DATA:  Progressive altered mental status for 2 months. EXAM: CHEST  2 VIEW COMPARISON:  Radiographs 06/22/2015 FINDINGS: Lung volumes. Mild cardiomegaly. Tortuous atherosclerotic thoracic aorta. Interstitial and bronchial thickening which is similar allowing for differences in technique.  Patchy bibasilar opacities, right greater than left, progressed from prior exam. No pleural fluid. No pneumothorax. The bones are under mineralized. IMPRESSION: 1. Patchy bibasilar opacities, right greater than left, persistent or recurrent from 2016 exam. This may reflect atelectasis superimposed on scarring versus sequela of aspiration in the appropriate clinical setting. 2. Mild cardiomegaly and atherosclerosis of the thoracic aorta. 3. Chronic interstitial and bronchial thickening, grossly stable allowing for lower lung volumes. Electronically Signed   By: Rubye OaksMelanie  Ehinger M.D.   On: 04/10/2017 18:08   Ct Head Wo Contrast  Result Date: 04/10/2017 CLINICAL DATA:  Altered mental status. EXAM: CT HEAD WITHOUT CONTRAST TECHNIQUE: Contiguous axial images were obtained from the base of the skull through the vertex without intravenous contrast. COMPARISON:  CT scan of June 20, 2015. FINDINGS: Brain: Mild diffuse cortical atrophy is noted. Mild chronic ischemic white matter disease is noted. No mass effect or midline shift is noted. Ventricular size is within normal limits. There is no evidence of mass lesion, hemorrhage or acute infarction. Vascular: No hyperdense vessel or unexpected calcification. Skull: Normal. Negative for fracture or focal lesion. Sinuses/Orbits: No acute finding.  Other: None. IMPRESSION: Mild diffuse cortical atrophy. Mild chronic ischemic white matter disease. No acute intracranial abnormality seen. Electronically Signed   By: Lupita RaiderJames  Green Jr, M.D.   On: 04/10/2017 18:00     Scheduled Meds: . apixaban  5 mg Oral BID  . busPIRone  7.5 mg Oral BID  . diltiazem  60 mg Oral Q6H  . divalproex  250 mg Oral TID  . famotidine  20 mg Oral Daily  . LORazepam  0.5 mg Oral Q8H  . memantine  10 mg Oral BID  . metoprolol tartrate  25 mg Oral BID  . potassium chloride SA  20 mEq Oral Daily  . traZODone  50 mg Oral QHS   Continuous Infusions: . azithromycin Stopped (04/12/17 1004)  . cefTRIAXone (ROCEPHIN)  IV Stopped (04/12/17 0123)    Pamella Pertostin Korvin Valentine, MD, PhD [facility] Pager [phone]  If 7PM-7AM, please contact night-coverage [email or web address] Password [email or web address] 04/12/2017, 11:30 AM

## 2017-04-12 NOTE — Care Management Note (Signed)
Case Management Note  Patient Details  Name: Sheryl Horn MRN: [id number] Date of Birth: 04/25/1946  Subjective/Objective:                  ams  Action/Plan: Date:  April 12, 2017 Chart reviewed for concurrent status and case management needs. Will continue to follow patient progress. Discharge Planning: following for needs Expected discharge date: [id number] Marcelle SmilingRhonda Davis, BSN, [place]3, [place]   [phone]  Expected Discharge Date:   (unknown)               Expected Discharge Plan:  Assisted Living / Rest Home  In-House Referral:  Clinical Social Work  Discharge planning Services  CM Consult  Post Acute Care Choice:    Choice offered to:     DME Arranged:    DME Agency:     HH Arranged:    HH Agency:     Status of Service:  In process, will continue to follow  If discussed at Long Length of Stay Meetings, dates discussed:    Additional Comments:  [name], [name], RN 04/12/2017, 8:56 AM

## 2017-04-12 NOTE — Progress Notes (Addendum)
BP soft 68/35. Decreased esmolol to 3950mcg/hr and paged MD. NS bolus 250ml  PRN q1h ordered for MAP <65 - One 250 bolus given. Continue to monitor

## 2017-04-12 NOTE — Progress Notes (Signed)
PHARMACIST - PHYSICIAN COMMUNICATION DR:   Elvera LennoxGherghe  CONCERNING: Antibiotic IV to Oral Route Change Policy  RECOMMENDATION: This patient is receiving azithromycin by the intravenous route.  Based on criteria approved by the Pharmacy and Therapeutics Committee, the antibiotic(s) is/are being converted to the equivalent oral dose form(s).   DESCRIPTION: These criteria include:  Patient being treated for a respiratory tract infection, urinary tract infection, cellulitis or clostridium difficile associated diarrhea if on metronidazole  The patient is not neutropenic and does not exhibit a GI malabsorption state  The patient is eating (either orally or via tube) and/or has been taking other orally administered medications for a least 24 hours  The patient is improving clinically and has a Tmax < 100.5  If you have questions about this conversion, please contact the Pharmacy Department  []   [phone]( [phone] )  [facility] []   [phone]( [phone] )  [facility] []   [phone]( [phone]  [facility] []   [phone]( [phone] )  [facility] [x]   [phone]( [phone] )  [facility]  Herby AbrahamMichelle T. Adayah Arocho, [place].D. [phone] 04/12/2017 1:26 PM

## 2017-04-13 DIAGNOSIS — I700 Atherosclerosis of aorta: Secondary | ICD-10-CM

## 2017-04-13 LAB — BASIC METABOLIC PANEL
Anion gap: 8 (ref 5–15)
BUN: 7 mg/dL (ref 6–20)
CALCIUM: 8.8 mg/dL — AB (ref 8.9–10.3)
CO2: 24 mmol/L (ref 22–32)
CREATININE: 0.78 mg/dL (ref 0.44–1.00)
Chloride: 112 mmol/L — ABNORMAL HIGH (ref 101–111)
GFR calc non Af Amer: >60 mL/min (ref >60)
Glucose, Bld: 105 mg/dL — ABNORMAL HIGH (ref 65–99)
Potassium: 3.8 mmol/L (ref 3.5–5.1)
SODIUM: 144 mmol/L (ref 135–145)

## 2017-04-13 LAB — CBC
HEMATOCRIT: 44.4 % (ref 36.0–46.0)
HEMOGLOBIN: 14.8 g/dL (ref 12.0–15.0)
MCH: 31.2 pg (ref 26.0–34.0)
MCHC: 33.3 g/dL (ref 30.0–36.0)
MCV: 93.7 fL (ref 78.0–100.0)
Platelets: 143 10*3/uL — ABNORMAL LOW (ref 150–400)
RBC: 4.74 MIL/uL (ref 3.87–5.11)
RDW: 15.2 % (ref 11.5–15.5)
WBC: 11.7 10*3/uL — AB (ref 4.0–10.5)

## 2017-04-13 MED ORDER — DIGOXIN 0.25 MG/ML IJ SOLN
0.2500 mg | Freq: Four times a day (QID) | INTRAMUSCULAR | Status: AC
  Administered 2017-04-13 (×2): 0.25 mg via INTRAVENOUS
  Filled 2017-04-13 (×3): qty 1

## 2017-04-13 MED ORDER — ASENAPINE MALEATE 5 MG SL SUBL
5.0000 mg | SUBLINGUAL_TABLET | Freq: Two times a day (BID) | SUBLINGUAL | Status: DC | PRN
  Administered 2017-04-13: 5 mg via SUBLINGUAL
  Filled 2017-04-13: qty 1

## 2017-04-13 MED ORDER — HYDRALAZINE HCL 20 MG/ML IJ SOLN
10.0000 mg | INTRAMUSCULAR | Status: DC | PRN
  Administered 2017-04-13 – 2017-04-16 (×2): 10 mg via INTRAVENOUS
  Filled 2017-04-13 (×2): qty 1

## 2017-04-13 MED ORDER — DIGOXIN 0.25 MG/ML IJ SOLN
0.2500 mg | Freq: Every day | INTRAMUSCULAR | Status: DC
  Administered 2017-04-13 – 2017-04-21 (×9): 0.25 mg via INTRAVENOUS
  Filled 2017-04-13 (×10): qty 1

## 2017-04-13 MED ORDER — METOPROLOL SUCCINATE ER 100 MG PO TB24
100.0000 mg | ORAL_TABLET | Freq: Every day | ORAL | Status: DC
  Administered 2017-04-13 – 2017-04-22 (×10): 100 mg via ORAL
  Filled 2017-04-13: qty 4
  Filled 2017-04-13: qty 1
  Filled 2017-04-13: qty 4
  Filled 2017-04-13: qty 1
  Filled 2017-04-13 (×2): qty 4
  Filled 2017-04-13 (×4): qty 1

## 2017-04-13 NOTE — Progress Notes (Signed)
PROGRESS NOTE  Sheryl Horn ZOX:[id number] DOB: [birth year] DOA: 04/10/2017 PCP: System, Provider Not In   LOS: 1 day   Brief Narrative / Interim history: Sheryl Horn is a 71 y.o. female with history of advanced dementia was brought to the ER with increasing confusion and behavioral disturbance. In the ER patient was seen by behavioral health team and advised inpatient psychiatric admission and patient was accepted at [facility]. Patient was waiting for transfer when patient's heart rate increased and was found to be in A. fib with RVR. Patient has advanced dementia and does not provide any history. Patient has a known history of A. fib and is on metoprolol and Cardizem for rate control and on Apixaban.  Assessment & Plan: Active Problems:   HTN (hypertension)   Dementia with behavioral disturbance   Atrial fibrillation with rapid ventricular response (HCC)   Thoracic aorta atherosclerosis (HCC)   A. fib with RVR -Patient was initiated on Cardizem infusion with improvement in her rates.  She was converted to p.o. metoprolol as well as carvedilol yesterday, however overnight her rates have gone back up into the 150s.  She was started back again on Cardizem infusion, and I started her on digoxin IV as well this morning. -Consulted cardiology, appreciate input -Continue anticoagulation with apixaban  Sepsis due to community-acquired pneumonia -Meet sepsis criteria with fever, elevated heart rate, and a source -Started on ceftriaxone and azithromycin in the emergency room, continue for now. -Febrile last night to 101, afebrile this morning.   Acute kidney injury -Likely due to dehydration, elevated heart rates as well as sepsis, continue IV fluids, renal function stable  Dementia with behavioral disturbances -Will need psych placement once medical issues are improved  Hypertension -Closely monitor, she was hypotensive overnight 9/6-9/7  -Blood pressure improving  Mild  thrombocytopenia -Stable, follow CBC   DVT prophylaxis: Apixaban Code Status: Full code Family Communication: No family at bedside Disposition Plan: Inpatient psychiatry when medically ready  Consultants:   Cardiology  Procedures:   None   Antimicrobials:  Ceftriaxone 9/6 >>  Azithromycin 9/6 >>  Subjective: -Minimally interactive, can track me with the eyes however does not answer any questions  Objective: Vitals:   04/13/17 0359 04/13/17 0600 04/13/17 0735 04/13/17 0748  BP:  (!) 183/72    Pulse:  (!) 111    Resp:  (!) 25    Temp: 98.9 F (37.2 C)  98.9 F (37.2 C) 98.3 F (36.8 C)  TempSrc: Oral  Axillary Oral  SpO2:  94%    Weight:      Height:        Intake/Output Summary (Last 24 hours) at 04/13/17 1100 Last data filed at 04/13/17 0902  Gross per 24 hour  Intake          3019.18 ml  Output             3500 ml  Net          -480.82 ml   Filed Weights   04/11/17 1913 04/12/17 0701  Weight: 62.2 kg (137 lb 3.2 oz) 62.2 kg (137 lb 2 oz)    Examination:  Vitals:   04/13/17 0359 04/13/17 0600 04/13/17 0735 04/13/17 0748  BP:  (!) 183/72    Pulse:  (!) 111    Resp:  (!) 25    Temp: 98.9 F (37.2 C)  98.9 F (37.2 C) 98.3 F (36.8 C)  TempSrc: Oral  Axillary Oral  SpO2:  94%  Weight:      Height:       Constitutional: Confused Eyes: lids and conjunctivae normal Neck: normal, supple Respiratory: Clear on anterior auscultation Cardiovascular: Irregular, tachycardic. No LE edema. 2+ pedal pulses.  Abdomen: no tenderness. Bowel sounds positive.  Skin: no rashes, lesions, ulcers. No induration Neurologic: non focal, does not follow commands but appears to move all 4 extremities    Data Reviewed: I have independently reviewed following labs and imaging studies   CBC:  Recent Labs Lab 04/10/17 1833 04/11/17 1803 04/12/17 0327 04/13/17 0329  WBC 6.0  --  7.9 11.7*  NEUTROABS 2.4  --   --   --   HGB 14.0 17.7* 14.3 14.8  HCT 43.1  52.0* 43.3 44.4  MCV 94.5  --  92.9 93.7  PLT 146*  --  138* 143*   Basic Metabolic Panel:  Recent Labs Lab 04/10/17 1833 04/11/17 1803 04/12/17 0327 04/13/17 0329  NA 142 147* 143 144  K 3.6 3.3* 3.5 3.8  CL 105 109 110 112*  CO2 30  --  23 24  GLUCOSE 92 126* 166* 105*  BUN 17 11 12 7   CREATININE 1.12* 0.90 1.06* 0.78  CALCIUM 8.9  --  8.4* 8.8*   GFR: Estimated Creatinine Clearance: 58.6 mL/min (by C-G formula based on SCr of 0.78 mg/dL). Liver Function Tests:  Recent Labs Lab 04/10/17 1833  AST 17  ALT 14  ALKPHOS 86  BILITOT 0.6  PROT 6.4*  ALBUMIN 3.3*   No results for input(s): LIPASE, AMYLASE in the last 168 hours.  Recent Labs Lab 04/10/17 1833  AMMONIA 38*   Coagulation Profile: No results for input(s): INR, PROTIME in the last 168 hours. Cardiac Enzymes:  Recent Labs Lab 04/12/17 0548  TROPONINI 0.06*   BNP (last 3 results) No results for input(s): PROBNP in the last 8760 hours. HbA1C: No results for input(s): HGBA1C in the last 72 hours. CBG:  Recent Labs Lab 04/10/17 1833  GLUCAP 84   Lipid Profile: No results for input(s): CHOL, HDL, LDLCALC, TRIG, CHOLHDL, LDLDIRECT in the last 72 hours. Thyroid Function Tests:  Recent Labs  04/11/17 2349  TSH 4.188   Anemia Panel: No results for input(s): VITAMINB12, FOLATE, FERRITIN, TIBC, IRON, RETICCTPCT in the last 72 hours. Urine analysis:    Component Value Date/Time   COLORURINE YELLOW 04/10/2017 1858   APPEARANCEUR CLEAR 04/10/2017 1858   LABSPEC 1.019 04/10/2017 1858   PHURINE 6.0 04/10/2017 1858   GLUCOSEU NEGATIVE 04/10/2017 1858   HGBUR NEGATIVE 04/10/2017 1858   BILIRUBINUR NEGATIVE 04/10/2017 1858   KETONESUR 5 (A) 04/10/2017 1858   PROTEINUR NEGATIVE 04/10/2017 1858   UROBILINOGEN 2.0 (H) 06/10/2015 1724   NITRITE NEGATIVE 04/10/2017 1858   LEUKOCYTESUR TRACE (A) 04/10/2017 1858   Sepsis Labs: Invalid input(s): PROCALCITONIN, LACTICIDVEN  Recent Results (from  the past 240 hour(s))  MRSA PCR Screening     Status: None   Collection Time: 04/11/17 10:30 PM  Result Value Ref Range Status   MRSA by PCR NEGATIVE NEGATIVE Final    Comment:        The GeneXpert MRSA Assay (FDA approved for NASAL specimens only), is one component of a comprehensive MRSA colonization surveillance program. It is not intended to diagnose MRSA infection nor to guide or monitor treatment for MRSA infections.       Radiology Studies: No results found.   Scheduled Meds: . apixaban  5 mg Oral BID  . azithromycin  500 mg Oral  Daily  . busPIRone  7.5 mg Oral BID  . digoxin  0.25 mg Intravenous Daily  . digoxin  0.25 mg Intravenous Q6H  . divalproex  250 mg Oral TID  . famotidine  20 mg Oral Daily  . LORazepam  0.5 mg Oral Q8H  . memantine  10 mg Oral BID  . metoprolol succinate  100 mg Oral Daily  . potassium chloride SA  20 mEq Oral Daily  . traZODone  50 mg Oral QHS   Continuous Infusions: . sodium chloride 75 mL/hr at 04/13/17 0130  . cefTRIAXone (ROCEPHIN)  IV Stopped (04/12/17 2245)  . diltiazem (CARDIZEM) infusion 15 mg/hr (04/13/17 7829)    Pamella Pert, MD, PhD [facility] Pager [phone]  If 7PM-7AM, please contact night-coverage [email or web address] Password [email or web address] 04/13/2017, 11:00 AM

## 2017-04-13 NOTE — Consult Note (Addendum)
Cardiology Consult Note  Admit date: 04/10/2017 Name: Sheryl Horn 71 y.o.  female DOB:  [birth year] MRN:  [id number]  Today's date:  04/13/2017  Referring Physician:  [facility]  Primary Physician:   [facility]  Reason for Consultation:    Atrial fibrillation   IMPRESSIONS: 1.  Atrial fibrillation with rapid ventricular response probably due to underlying illness as well as lack of medicines 2.  Advanced dementia 3.  Hypertension 4.  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  Long-term use of anticoagulation  RECOMMENDATION: 1.  Restart medicines for atrial fibrillation as her mental status improves-she needs to be on fairly high doses of beta blockers and calcium channel blockers. 2.  Continue anticoagulation 3.  Intravenous digoxin to help with rate control 4.  Treat for pneumonia and other underlying causes that may drive atrial fibrillation rate 5.  No workup is necessary for the elevated troponin. 6.  Intravenous digoxin to help control rate until she can take her oral medications.  HISTORY: This 71 year old female is seen in consultation for rapid atrial fibrillation.  The patient is not responsive to questions and I can obtain no history other than from the old chart which was reviewed in detail including previous records.  She has a history of bipolar disorder depression and if can dementia.  She was hospitalized earlier this year in [facility] with respiratory distress and felt to have a COPD exacerbation.  Echocardiogram showed an EF of 40-45% at that time.  She has chronic atrial fibrillation was later seen by Dr. Beverely Pace who felt that she was stable.  She lives at [facility] home in a nursing home and was admitted to the emergency room with increasing behavior of all difficulty and was supposed to be transferred to a psychiatry unit at [facility] however she was found to  be in rapid atrial fibrillation and was transferred here.  No history can be obtained from her.  She was started on intravenous diltiazem overnight.  Past Medical History:  Diagnosis Date  . A-fib (HCC)   . Anxiety   . Dementia   . Depression   . Hypertension       History reviewed. No pertinent surgical history.   Allergies:  is allergic to bacitracin; other; penicillins; and statins.   Medications: Prior to Admission medications   Medication Sig Start Date End Date Taking? Authorizing Provider  albuterol (VENTOLIN HFA) 108 (90 Base) MCG/ACT inhaler Inhale 2 puffs into the lungs every 6 (six) hours as needed for wheezing or shortness of breath.   Yes [provider]  apixaban (ELIQUIS) 5 MG TABS tablet Take 5 mg by mouth 2 (two) times daily.   Yes [provider]  busPIRone (BUSPAR) 15 MG tablet Take 7.5 mg by mouth 2 (two) times daily.   Yes [provider]  diltiazem (TIAZAC) 300 MG 24 hr capsule Take 300 mg by mouth daily.   Yes [provider]  divalproex (DEPAKOTE SPRINKLE) 125 MG capsule Take 250 mg by mouth 3 (three) times daily.   Yes [provider]  furosemide (LASIX) 40 MG tablet Take 40 mg by mouth daily.   Yes [provider]  LORazepam (ATIVAN) 0.5 MG tablet Take 0.5 mg by mouth every 8 (eight) hours.   Yes [provider]  Melatonin 10 MG CAPS Take 1 capsule by mouth at bedtime.   Yes [provider]  memantine (NAMENDA) 10 MG tablet Take 10 mg by mouth 2 (two) times daily.   Yes [provider]  metoprolol succinate (TOPROL-XL) 100 MG 24 hr tablet Take 100 mg by mouth daily. Take with or immediately following a meal.   Yes [provider]  Nutritional Supplements (NUTRA/SHAKE FREE PO) Take 237 mLs by mouth 2 (two) times daily.   Yes [provider]  nystatin (MYCOSTATIN/NYSTOP) powder Apply 1 Bottle topically 2 (two) times daily.   Yes [provider]  Omega-3  Fatty Acids (FISH OIL) 1000 MG CAPS Take 2 capsules by mouth 2 (two) times daily.   Yes [provider]  potassium chloride SA (K-DUR,KLOR-CON) 20 MEQ tablet Take 20 mEq by mouth daily.   Yes [provider]  ranitidine (ZANTAC) 150 MG tablet Take 150 mg by mouth daily.   Yes [provider]  traZODone (DESYREL) 50 MG tablet Take 50 mg by mouth at bedtime.   Yes [provider]  alum & mag hydroxide-simeth (MAALOX/MYLANTA) 200-200-20 MG/5ML suspension Take 30 mLs by mouth every 6 (six) hours as needed for indigestion or heartburn (dyspepsia). Patient not taking: Reported on 04/10/2017 06/26/15   Dorothea Ogle, MD  clindamycin (CLEOCIN) 300 MG capsule Take 1 capsule (300 mg total) by mouth every 6 (six) hours. Patient not taking: Reported on 04/10/2017 06/26/15   Dorothea Ogle, MD  memantine (NAMENDA) 5 MG tablet Take 1 tablet (5 mg total) by mouth daily. Patient not taking: Reported on 04/10/2017 06/26/15   Dorothea Ogle, MD  metoprolol tartrate (LOPRESSOR) 25 MG tablet Take 0.5 tablets (12.5 mg total) by mouth 2 (two) times daily. Patient not taking: Reported on 04/10/2017 06/26/15   Dorothea Ogle, MD    Family History: No family status information on file.    Social History:  Lives in a nursing home.  She has a prior history of smoking according to the old chart and quit in 2016.   Review of Systems: Not able to be obtained due to her dementia   Physical Exam: BP (!) 183/72   Pulse (!) 111   Temp 98.3 F (36.8 C) (Oral)   Resp (!) 25   Ht  (1.676 m)   Wt 62.2 kg (137 lb 2 oz)   SpO2 94%   BMI 22.13 kg/m   General appearance: She is an elderly appearing female lying in bed with mild increased work of breathing and does not respond to questions Head: Normocephalic, without obvious abnormality, atraumatic Eyes: conjunctivae/corneas clear. PERRL, EOM's intact. Fundi not examined  Mouth: Edentulous-dry mucous membranes  Neck: no adenopathy,  no carotid bruit, no JVD and supple, symmetrical, trachea midline Lungs: Decreased breath sounds at the bases with minimal rhonchi Heart: Rapid irregular rhythm, normal S1 and S2 and no murmur Abdomen: soft, non-tender; bowel sounds normal; no masses,  no organomegaly Pelvic: deferred Extremities: extremities normal, atraumatic, no cyanosis or edema Pulses: 2+ and symmetric Skin: No obvious bruising.  Skin turgor is diminished. Neurologic: Grossly normal Psych: Unable to assess.  Does not answer questions. Labs: CBC  Recent Labs  04/10/17 1833  04/13/17 0329  WBC 6.0  < > 11.7*  RBC 4.56  < > 4.74  HGB 14.0  < > 14.8  HCT 43.1  < > 44.4  PLT 146*  < > 143*  MCV 94.5  < > 93.7  MCH 30.7  < > 31.2  MCHC 32.5  < > 33.3  RDW 14.9  < >  15.2  LYMPHSABS 2.5  --   --   MONOABS 0.8  --   --   EOSABS 0.3  --   --   BASOSABS 0.0  --   --   < > = values in this interval not displayed. CMP   Recent Labs  04/10/17 1833  04/13/17 0329  NA 142  < > 144  K 3.6  < > 3.8  CL 105  < > 112*  CO2 30  < > 24  GLUCOSE 92  < > 105*  BUN 17  < > 7  CREATININE 1.12*  < > 0.78  CALCIUM 8.9  < > 8.8*  PROT 6.4*  --   --   ALBUMIN 3.3*  --   --   AST 17  --   --   ALT 14  --   --   ALKPHOS 86  --   --   BILITOT 0.6  --   --   GFRNONAA 48*  < > >60  GFRAA 55*  < > >60  < > = values in this interval not displayed. BNP (last 3 results) BNP No results found for: BNP Cardiac Panel (last 3 results) Troponin (Point of Care Test)  Recent Labs  04/11/17 1802  TROPIPOC 0.06   Cardiac Panel (last 3 results)  Recent Labs  04/12/17 0548  TROPONINI 0.06*     Radiology:  Chest x-ray shows chronic patchy basilar opacities, cardiomegaly and thoracic atherosclerosis  EKG: Atrial fibrillation with rapid ventricular response, nonspecific ST changes Independently reviewed by me   Signed:  W. Ashley RoyaltySpencer Jeydi Klingel, Jr. MD [facility]   Cardiology Consultant  04/13/2017, 9:48 AM

## 2017-04-14 DIAGNOSIS — R410 Disorientation, unspecified: Secondary | ICD-10-CM

## 2017-04-14 LAB — CBC
HEMATOCRIT: 42.8 % (ref 36.0–46.0)
HEMOGLOBIN: 14.5 g/dL (ref 12.0–15.0)
MCH: 31.5 pg (ref 26.0–34.0)
MCHC: 33.9 g/dL (ref 30.0–36.0)
MCV: 92.8 fL (ref 78.0–100.0)
Platelets: 112 10*3/uL — ABNORMAL LOW (ref 150–400)
RBC: 4.61 MIL/uL (ref 3.87–5.11)
RDW: 15.1 % (ref 11.5–15.5)
WBC: 9.1 10*3/uL (ref 4.0–10.5)

## 2017-04-14 LAB — BASIC METABOLIC PANEL
Anion gap: 7 (ref 5–15)
BUN: 8 mg/dL (ref 6–20)
CO2: 22 mmol/L (ref 22–32)
Calcium: 8.3 mg/dL — ABNORMAL LOW (ref 8.9–10.3)
Chloride: 111 mmol/L (ref 101–111)
Creatinine, Ser: 0.66 mg/dL (ref 0.44–1.00)
GFR calc Af Amer: >60 mL/min (ref >60)
GFR calc non Af Amer: >60 mL/min (ref >60)
GLUCOSE: 104 mg/dL — AB (ref 65–99)
POTASSIUM: 3.6 mmol/L (ref 3.5–5.1)
Sodium: 140 mmol/L (ref 135–145)

## 2017-04-14 LAB — PROCALCITONIN: Procalcitonin: 0.12 ng/mL

## 2017-04-14 MED ORDER — ORAL CARE MOUTH RINSE
15.0000 mL | Freq: Two times a day (BID) | OROMUCOSAL | Status: DC
  Administered 2017-04-14 – 2017-04-21 (×12): 15 mL via OROMUCOSAL

## 2017-04-14 MED ORDER — DILTIAZEM HCL 90 MG PO TABS
90.0000 mg | ORAL_TABLET | Freq: Three times a day (TID) | ORAL | Status: DC
  Administered 2017-04-14 – 2017-04-22 (×24): 90 mg via ORAL
  Filled 2017-04-14 (×25): qty 1

## 2017-04-14 NOTE — Progress Notes (Signed)
PROGRESS NOTE  Sheryl Horn ZOX:[id number] DOB: 12/16/1945 DOA: 04/10/2017 PCP: System, Provider Not In   LOS: 2 days   Brief Narrative / Interim history: Sheryl Horn is a 71 y.o. female with history of advanced dementia was brought to the ER with increasing confusion and behavioral disturbance. In the ER patient was seen by behavioral health team and advised inpatient psychiatric admission and patient was accepted at [facility]. Patient was waiting for transfer when patient's heart rate increased and was found to be in A. fib with RVR. Patient has advanced dementia and does not provide any history. Patient has a known history of A. fib and is on metoprolol and Cardizem for rate control and on Apixaban.  Assessment & Plan: Active Problems:   HTN (hypertension)   Dementia with behavioral disturbance   Atrial fibrillation with rapid ventricular response (HCC)   Thoracic aorta atherosclerosis (HCC)   A. fib with RVR -Patient was initiated on Cardizem infusion with improvement in her rates.  She was converted to p.o. metoprolol as well as carvedilol 9/6, however overnight towards 9/7 her rates have gone back up into the 150s.  She was started back again on Cardizem IV and digoxin -Consulted cardiology, appreciate input. Start Metoprolol today, wean off the same infusion -Continue anticoagulation with apixaban  Sepsis due to community-acquired pneumonia -Meet sepsis criteria with fever, elevated heart rate, and a source -Started on ceftriaxone and azithromycin in the emergency room, continue for now. -Fever curve overall improvement, highest temperature 100 over the last 24 hours.  She is afebrile this morning.  Acute kidney injury -Likely due to dehydration, elevated heart rates as well as sepsis, continue IV fluids, -Creatinine has now normalized  Dementia with behavioral disturbances -Will need psych placement once medical issues are improved  Hypertension -Closely monitor,  she was hypotensive overnight 9/6-9/7  -Blood pressure improving, closely monitor with metoprolol and Cardizem  Mild thrombocytopenia -Stable, follow CBC, platelets 112 2 the slightly worse likely in the setting of sepsis   DVT prophylaxis: Apixaban Code Status: Full code Family Communication: No family at bedside Disposition Plan: Inpatient psychiatry when medically ready  Consultants:   Cardiology  Procedures:   None   Antimicrobials:  Ceftriaxone 9/6 >>  Azithromycin 9/6 >>  Subjective: -More interactive this morning, states she feels "bad" however cannot elaborate.  She is confused.  Denies being in pain, has no nausea or vomiting and is not sure why she is feeling "bad"  Objective: Vitals:   04/14/17 0800 04/14/17 0900 04/14/17 1000 04/14/17 1100  BP: (!) 102/37 (!) 97/40 (!) 118/50 (!) 101/35  Pulse: 83 75 86 75  Resp: 18 (!) 21 (!) 26 (!) 23  Temp: 97.6 F (36.4 C)     TempSrc: Axillary     SpO2: 95% 98% 94% 96%  Weight:      Height:        Intake/Output Summary (Last 24 hours) at 04/14/17 1132 Last data filed at 04/14/17 0300  Gross per 24 hour  Intake          1843.33 ml  Output             1000 ml  Net           843.33 ml   Filed Weights   04/11/17 1913 04/12/17 0701  Weight: 62.2 kg (137 lb 3.2 oz) 62.2 kg (137 lb 2 oz)    Examination:  Vitals:   04/14/17 0800 04/14/17 0900 04/14/17 1000 04/14/17 1100  BP: (!) 102/37 (!) 97/40 (!) 118/50 (!) 101/35  Pulse: 83 75 86 75  Resp: 18 (!) 21 (!) 26 (!) 23  Temp: 97.6 F (36.4 C)     TempSrc: Axillary     SpO2: 95% 98% 94% 96%  Weight:      Height:       Constitutional: NAD, confused Eyes: lids and conjunctivae normal ENMT: Mucous membranes are moist.  Respiratory: clear to auscultation bilaterally, no wheezing, no crackles. Normal respiratory effort.  Cardiovascular: irregular, tachycardic. No LE edema. 2+ pedal pulses.  Abdomen: no tenderness. Bowel sounds positive.  Neurologic: non  focal   Data Reviewed: I have independently reviewed following labs and imaging studies   CBC:  Recent Labs Lab 04/10/17 1833 04/11/17 1803 04/12/17 0327 04/13/17 0329 04/14/17 0353  WBC 6.0  --  7.9 11.7* 9.1  NEUTROABS 2.4  --   --   --   --   HGB 14.0 17.7* 14.3 14.8 14.5  HCT 43.1 52.0* 43.3 44.4 42.8  MCV 94.5  --  92.9 93.7 92.8  PLT 146*  --  138* 143* 112*   Basic Metabolic Panel:  Recent Labs Lab 04/10/17 1833 04/11/17 1803 04/12/17 0327 04/13/17 0329 04/14/17 0353  NA 142 147* 143 144 140  K 3.6 3.3* 3.5 3.8 3.6  CL 105 109 110 112* 111  CO2 30  --  23 24 22   GLUCOSE 92 126* 166* 105* 104*  BUN 17 11 12 7 8   CREATININE 1.12* 0.90 1.06* 0.78 0.66  CALCIUM 8.9  --  8.4* 8.8* 8.3*   GFR: Estimated Creatinine Clearance: 58.6 mL/min (by C-G formula based on SCr of 0.66 mg/dL). Liver Function Tests:  Recent Labs Lab 04/10/17 1833  AST 17  ALT 14  ALKPHOS 86  BILITOT 0.6  PROT 6.4*  ALBUMIN 3.3*   No results for input(s): LIPASE, AMYLASE in the last 168 hours.  Recent Labs Lab 04/10/17 1833  AMMONIA 38*   Coagulation Profile: No results for input(s): INR, PROTIME in the last 168 hours. Cardiac Enzymes:  Recent Labs Lab 04/12/17 0548  TROPONINI 0.06*   BNP (last 3 results) No results for input(s): PROBNP in the last 8760 hours. HbA1C: No results for input(s): HGBA1C in the last 72 hours. CBG:  Recent Labs Lab 04/10/17 1833  GLUCAP 84   Lipid Profile: No results for input(s): CHOL, HDL, LDLCALC, TRIG, CHOLHDL, LDLDIRECT in the last 72 hours. Thyroid Function Tests:  Recent Labs  04/11/17 2349  TSH 4.188   Anemia Panel: No results for input(s): VITAMINB12, FOLATE, FERRITIN, TIBC, IRON, RETICCTPCT in the last 72 hours. Urine analysis:    Component Value Date/Time   COLORURINE YELLOW 04/10/2017 1858   APPEARANCEUR CLEAR 04/10/2017 1858   LABSPEC 1.019 04/10/2017 1858   PHURINE 6.0 04/10/2017 1858   GLUCOSEU NEGATIVE  04/10/2017 1858   HGBUR NEGATIVE 04/10/2017 1858   BILIRUBINUR NEGATIVE 04/10/2017 1858   KETONESUR 5 (A) 04/10/2017 1858   PROTEINUR NEGATIVE 04/10/2017 1858   UROBILINOGEN 2.0 (H) 06/10/2015 1724   NITRITE NEGATIVE 04/10/2017 1858   LEUKOCYTESUR TRACE (A) 04/10/2017 1858   Sepsis Labs: Invalid input(s): PROCALCITONIN, LACTICIDVEN  Recent Results (from the past 240 hour(s))  MRSA PCR Screening     Status: None   Collection Time: 04/11/17 10:30 PM  Result Value Ref Range Status   MRSA by PCR NEGATIVE NEGATIVE Final    Comment:        The GeneXpert MRSA Assay (FDA approved for  NASAL specimens only), is one component of a comprehensive MRSA colonization surveillance program. It is not intended to diagnose MRSA infection nor to guide or monitor treatment for MRSA infections.       Radiology Studies: No results found.   Scheduled Meds: . apixaban  5 mg Oral BID  . azithromycin  500 mg Oral Daily  . busPIRone  7.5 mg Oral BID  . digoxin  0.25 mg Intravenous Daily  . divalproex  250 mg Oral TID  . famotidine  20 mg Oral Daily  . LORazepam  0.5 mg Oral Q8H  . mouth rinse  15 mL Mouth Rinse BID  . memantine  10 mg Oral BID  . metoprolol succinate  100 mg Oral Daily  . potassium chloride SA  20 mEq Oral Daily  . traZODone  50 mg Oral QHS   Continuous Infusions: . sodium chloride 75 mL/hr at 04/14/17 0529  . cefTRIAXone (ROCEPHIN)  IV Stopped (04/13/17 2315)  . diltiazem (CARDIZEM) infusion 12.5 mg/hr (04/14/17 0657)    Pamella Pert, MD, PhD [facility] Pager [phone]  If 7PM-7AM, please contact night-coverage [email or web address] Password [email or web address] 04/14/2017, 11:32 AM

## 2017-04-14 NOTE — Progress Notes (Signed)
Subjective:  Atrial fibrillation rate better controlled overnight.  Blood pressure is still borderline but reasonable today.  She states today that she feels fine.  Objective:  Vital Signs in the last 24 hours: BP (!) 102/37   Pulse 83   Temp 97.6 F (36.4 C) (Axillary)   Resp 18   Ht 5\' 6"  (1.676 m)   Wt 62.2 kg (137 lb 2 oz)   SpO2 95%   BMI 22.13 kg/m   Physical Exam: Elderly female, does respond to voice today with appropriate comments Lungs:  Reduced breath sounds at the bases Mouth: Edentulous, dry mucous membranes Cardiac:  Irregular rhythm, normal S1 and S2, no S3 Extremities:  No edema present  Intake/Output from previous day: 09/08 0701 - 09/09 0700 In: 2563.3 [P.O.:770; I.V.:1793.3] Out: 2000 [Urine:2000] Weight Filed Weights   04/11/17 1913 04/12/17 0701  Weight: 62.2 kg (137 lb 3.2 oz) 62.2 kg (137 lb 2 oz)    Lab Results: Basic Metabolic Panel:  Recent Labs  16/05/9608/08/18 0329 04/14/17 0353  NA 144 140  K 3.8 3.6  CL 112* 111  CO2 24 22  GLUCOSE 105* 104*  BUN 7 8  CREATININE 0.78 0.66    CBC:  Recent Labs  04/13/17 0329 04/14/17 0353  WBC 11.7* 9.1  HGB 14.8 14.5  HCT 44.4 42.8  MCV 93.7 92.8  PLT 143* 112*    Cardiac Panel (last 3 results)  Recent Labs  04/12/17 0548  TROPONINI 0.06*   Telemetry: Atrial fibrillation now with controlled ventricular response.  Personally reviewed by me  Assessment/Plan:  1.  Atrial fibrillation chronic now controlled 2.  Hypertensive heart disease 3.  Thoracic atherosclerosis 4.  Combined chronic systolic and diastolic heart failure  Recommendations:  Atrial fibrillation rate is better controlled with some digoxin yesterday.  I don't think she'll need this long-term and would try to get her off of IV Cardizem and back on her previous oral regimen which was fairly high dose metoprolol succinate as well as diltiazem.  Please call if recurrent issues needed to be addressed     W. Ashley RoyaltySpencer  Tilley, Jr.  MD [facility] Cardiology  04/14/2017, 8:19 AM

## 2017-04-15 LAB — CBC
HCT: 39.6 % (ref 36.0–46.0)
HEMOGLOBIN: 13.2 g/dL (ref 12.0–15.0)
MCH: 31.1 pg (ref 26.0–34.0)
MCHC: 33.3 g/dL (ref 30.0–36.0)
MCV: 93.2 fL (ref 78.0–100.0)
Platelets: 110 10*3/uL — ABNORMAL LOW (ref 150–400)
RBC: 4.25 MIL/uL (ref 3.87–5.11)
RDW: 14.8 % (ref 11.5–15.5)
WBC: 8.3 10*3/uL (ref 4.0–10.5)

## 2017-04-15 LAB — BASIC METABOLIC PANEL
ANION GAP: 8 (ref 5–15)
BUN: 10 mg/dL (ref 6–20)
CALCIUM: 8.4 mg/dL — AB (ref 8.9–10.3)
CO2: 22 mmol/L (ref 22–32)
CREATININE: 0.61 mg/dL (ref 0.44–1.00)
Chloride: 110 mmol/L (ref 101–111)
GFR calc Af Amer: >60 mL/min (ref >60)
GLUCOSE: 95 mg/dL (ref 65–99)
Potassium: 3.8 mmol/L (ref 3.5–5.1)
Sodium: 140 mmol/L (ref 135–145)

## 2017-04-15 NOTE — Care Management Note (Signed)
Case Management Note  Patient Details  Name: Kathi DerBetty Nesser MRN: [id number] Date of Birth: 09/29/1945  Subjective/Objective:      Pna, a.fib with rvr, AKI,              Action/Plan: Date:  April 15, 2017 Chart reviewed for concurrent status and case management needs. Will continue to follow patient progress. Discharge Planning: following for needs Expected discharge date: [id number] Marcelle SmilingRhonda Davis, BSN, [place]3, [place]   [phone]  Expected Discharge Date:   (unknown)               Expected Discharge Plan:  Skilled Nursing Facility  In-House Referral:  Clinical Social Work  Discharge planning Services  CM Consult  Post Acute Care Choice:    Choice offered to:     DME Arranged:    DME Agency:     HH Arranged:    HH Agency:     Status of Service:  In process, will continue to follow  If discussed at Long Length of Stay Meetings, dates discussed:    Additional Comments:  [name], [name], RN 04/15/2017, 9:02 AM

## 2017-04-15 NOTE — Progress Notes (Signed)
Ativan 0.5mg m held . Patient drowsey but arouses easily.

## 2017-04-15 NOTE — Progress Notes (Signed)
PROGRESS NOTE  Sheryl DerBetty Jamroz FUX:[id number] DOB: 11/16/1945 DOA: 04/10/2017 PCP: System, Provider Not In   LOS: 3 days   Brief Narrative / Interim history: Sheryl Horn is a 71 y.o. female with history of advanced dementia was brought to the ER with increasing confusion and behavioral disturbance. In the ER patient was seen by behavioral health team and advised inpatient psychiatric admission and [facility]. Patient was waiting for transfer when patient's heart rate increased and was found to be in A. fib with RVR. Patient has advanced dementia and does not provide any history. Patient has a known history of A. fib and is on metoprolol and Cardizem for rate control and on Apixaban.  Assessment & Plan: Active Problems:   HTN (hypertension)   Dementia with behavioral disturbance   Atrial fibrillation with rapid ventricular response (HCC)   Thoracic aorta atherosclerosis (HCC)   A. fib with RVR -Patient was initiated on Cardizem infusion with improvement in her rates.  She was converted to p.o. metoprolol as well as carvedilol 9/6, however overnight towards 9/7 her rates have gone back up into the 150s.  She was started back again on Cardizem IV and digoxin -Consulted cardiology, appreciate input.  -Continue anticoagulation with apixaban -rates controlled yesterday, converted to po cardizem  Sepsis due to community-acquired pneumonia -Meet sepsis criteria with fever, elevated heart rate, and a source -Started on ceftriaxone and azithromycin in the emergency room, continue for now. -Fever curve overall improvement, but still febrile. No abdominal pain, no other apparent sources. If fever persists, may need to re-image lungs. WBC has now normalized.   Acute kidney injury -Likely due to dehydration, elevated heart rates as well as sepsis -Creatinine has now normalized  Dementia with behavioral disturbances -Will need psych placement once medical issues are  improved  Hypertension -Blood pressure improving and now on the hypertensive side  Mild thrombocytopenia -Stable, follow CBC, platelets 110, slightly worse likely in the setting of sepsis    DVT prophylaxis: Apixaban Code Status: Full code Family Communication: No family at bedside Disposition Plan: Inpatient psychiatry when medically ready  Consultants:   Cardiology  Procedures:   None   Antimicrobials:  Ceftriaxone 9/6 >>  Azithromycin 9/6 >>  Subjective: -confused, answering few but not all questions. Denies any pain. She is alert  Objective: Vitals:   04/15/17 0600 04/15/17 0800 04/15/17 0912 04/15/17 1000  BP: (!) 148/53 (!) 132/50 (!) 132/50 (!) 101/57  Pulse: 90 94 92 84  Resp:  (!) 28  (!) 22  Temp:  99.2 F (37.3 C)    TempSrc:  Oral    SpO2: 96% 96%  99%  Weight:      Height:        Intake/Output Summary (Last 24 hours) at 04/15/17 1028 Last data filed at 04/15/17 1000  Gross per 24 hour  Intake          3288.88 ml  Output             1400 ml  Net          1888.88 ml   Filed Weights   04/11/17 1913 04/12/17 0701  Weight: 62.2 kg (137 lb 3.2 oz) 62.2 kg (137 lb 2 oz)    Examination:  Vitals:   04/15/17 0600 04/15/17 0800 04/15/17 0912 04/15/17 1000  BP: (!) 148/53 (!) 132/50 (!) 132/50 (!) 101/57  Pulse: 90 94 92 84  Resp:  (!) 28  (!) 22  Temp:  99.2 F (37.3 C)    TempSrc:  Oral    SpO2: 96% 96%  99%  Weight:      Height:       Constitutional: NAD Eyes: no scleral icterus  ENMT: MMM Respiratory: CTA biL, no wheezing on anterior auscultation Cardiovascular: irregular, no LE edema. No JVD Abdomen: non distended, non tender. BS + Neurologic: no focal findings.    Data Reviewed: I have independently reviewed following labs and imaging studies   CBC:  Recent Labs Lab 04/10/17 1833 04/11/17 1803 04/12/17 0327 04/13/17 0329 04/14/17 0353 04/15/17 0757  WBC 6.0  --  7.9 11.7* 9.1 8.3  NEUTROABS 2.4  --   --   --   --    --   HGB 14.0 17.7* 14.3 14.8 14.5 13.2  HCT 43.1 52.0* 43.3 44.4 42.8 39.6  MCV 94.5  --  92.9 93.7 92.8 93.2  PLT 146*  --  138* 143* 112* 110*   Basic Metabolic Panel:  Recent Labs Lab 04/10/17 1833 04/11/17 1803 04/12/17 0327 04/13/17 0329 04/14/17 0353 04/15/17 0757  NA 142 147* 143 144 140 140  K 3.6 3.3* 3.5 3.8 3.6 3.8  CL 105 109 110 112* 111 110  CO2 30  --  GLUCOSE 92 126* 166* 105* 104* 95  BUN CREATININE 1.12* 0.90 1.06* 0.78 0.66 0.61  CALCIUM 8.9  --  8.4* 8.8* 8.3* 8.4*   GFR: Estimated Creatinine Clearance: 58.6 mL/min (by C-G formula based on SCr of 0.61 mg/dL). Liver Function Tests:  Recent Labs Lab 04/10/17 1833  AST 17  ALT 14  ALKPHOS 86  BILITOT 0.6  PROT 6.4*  ALBUMIN 3.3*   No results for input(s): LIPASE, AMYLASE in the last 168 hours.  Recent Labs Lab 04/10/17 1833  AMMONIA 38*   Coagulation Profile: No results for input(s): INR, PROTIME in the last 168 hours. Cardiac Enzymes:  Recent Labs Lab 04/12/17 0548  TROPONINI 0.06*   BNP (last 3 results) No results for input(s): PROBNP in the last 8760 hours. HbA1C: No results for input(s): HGBA1C in the last 72 hours. CBG:  Recent Labs Lab 04/10/17 1833  GLUCAP 84   Lipid Profile: No results for input(s): CHOL, HDL, LDLCALC, TRIG, CHOLHDL, LDLDIRECT in the last 72 hours. Thyroid Function Tests: No results for input(s): TSH, T4TOTAL, FREET4, T3FREE, THYROIDAB in the last 72 hours. Anemia Panel: No results for input(s): VITAMINB12, FOLATE, FERRITIN, TIBC, IRON, RETICCTPCT in the last 72 hours. Urine analysis:    Component Value Date/Time   COLORURINE YELLOW 04/10/2017 1858   APPEARANCEUR CLEAR 04/10/2017 1858   LABSPEC 1.019 04/10/2017 1858   PHURINE 6.0 04/10/2017 1858   GLUCOSEU NEGATIVE 04/10/2017 1858   HGBUR NEGATIVE 04/10/2017 1858   BILIRUBINUR NEGATIVE 04/10/2017 1858   KETONESUR 5 (A) 04/10/2017 1858   PROTEINUR NEGATIVE  04/10/2017 1858   UROBILINOGEN 2.0 (H) 06/10/2015 1724   NITRITE NEGATIVE 04/10/2017 1858   LEUKOCYTESUR TRACE (A) 04/10/2017 1858   Sepsis Labs: Invalid input(s): PROCALCITONIN, LACTICIDVEN  Recent Results (from the past 240 hour(s))  MRSA PCR Screening     Status: None   Collection Time: 04/11/17 10:30 PM  Result Value Ref Range Status   MRSA by PCR NEGATIVE NEGATIVE Final    Comment:        The GeneXpert MRSA Assay (FDA approved for NASAL specimens only), is one component of a comprehensive MRSA colonization surveillance program. It is  not intended to diagnose MRSA infection nor to guide or monitor treatment for MRSA infections.       Radiology Studies: No results found.   Scheduled Meds: . apixaban  5 mg Oral BID  . azithromycin  500 mg Oral Daily  . busPIRone  7.5 mg Oral BID  . digoxin  0.25 mg Intravenous Daily  . diltiazem  90 mg Oral Q8H  . divalproex  250 mg Oral TID  . famotidine  20 mg Oral Daily  . LORazepam  0.5 mg Oral Q8H  . mouth rinse  15 mL Mouth Rinse BID  . memantine  10 mg Oral BID  . metoprolol succinate  100 mg Oral Daily  . potassium chloride SA  20 mEq Oral Daily  . traZODone  50 mg Oral QHS   Continuous Infusions: . sodium chloride 75 mL/hr at 04/15/17 1000  . cefTRIAXone (ROCEPHIN)  IV Stopped (04/14/17 2141)    Pamella Pert, MD, PhD [facility] Pager [phone]  If 7PM-7AM, please contact night-coverage [email or web address] Password [email or web address] 04/15/2017, 10:28 AM

## 2017-04-16 ENCOUNTER — Inpatient Hospital Stay (HOSPITAL_COMMUNITY): Payer: Medicare Other

## 2017-04-16 DIAGNOSIS — I700 Atherosclerosis of aorta: Secondary | ICD-10-CM

## 2017-04-16 DIAGNOSIS — I4891 Unspecified atrial fibrillation: Secondary | ICD-10-CM

## 2017-04-16 DIAGNOSIS — G4700 Insomnia, unspecified: Secondary | ICD-10-CM

## 2017-04-16 DIAGNOSIS — F39 Unspecified mood [affective] disorder: Secondary | ICD-10-CM

## 2017-04-16 DIAGNOSIS — R451 Restlessness and agitation: Secondary | ICD-10-CM

## 2017-04-16 DIAGNOSIS — R413 Other amnesia: Secondary | ICD-10-CM

## 2017-04-16 DIAGNOSIS — F0391 Unspecified dementia with behavioral disturbance: Secondary | ICD-10-CM

## 2017-04-16 LAB — PROCALCITONIN: Procalcitonin: <0.1 ng/mL

## 2017-04-16 MED ORDER — PIPERACILLIN-TAZOBACTAM 3.375 G IVPB
3.3750 g | Freq: Three times a day (TID) | INTRAVENOUS | Status: DC
  Administered 2017-04-16 – 2017-04-22 (×17): 3.375 g via INTRAVENOUS
  Filled 2017-04-16 (×17): qty 50

## 2017-04-16 MED ORDER — IOPAMIDOL (ISOVUE-300) INJECTION 61%
INTRAVENOUS | Status: AC
  Filled 2017-04-16: qty 30

## 2017-04-16 MED ORDER — GADOBENATE DIMEGLUMINE 529 MG/ML IV SOLN
15.0000 mL | Freq: Once | INTRAVENOUS | Status: AC | PRN
  Administered 2017-04-16: 12 mL via INTRAVENOUS

## 2017-04-16 MED ORDER — IOPAMIDOL (ISOVUE-300) INJECTION 61%
100.0000 mL | Freq: Once | INTRAVENOUS | Status: AC | PRN
  Administered 2017-04-16: 100 mL via INTRAVENOUS

## 2017-04-16 MED ORDER — IOPAMIDOL (ISOVUE-300) INJECTION 61%
30.0000 mL | Freq: Once | INTRAVENOUS | Status: AC | PRN
  Administered 2017-04-16: 30 mL via ORAL

## 2017-04-16 MED ORDER — MEMANTINE HCL 10 MG PO TABS
10.0000 mg | ORAL_TABLET | Freq: Every day | ORAL | Status: DC
  Administered 2017-04-17 – 2017-04-22 (×6): 10 mg via ORAL
  Filled 2017-04-16 (×6): qty 1

## 2017-04-16 MED ORDER — IOPAMIDOL (ISOVUE-300) INJECTION 61%
INTRAVENOUS | Status: AC
  Filled 2017-04-16: qty 100

## 2017-04-16 NOTE — Progress Notes (Signed)
To MRI

## 2017-04-16 NOTE — Progress Notes (Signed)
Spoke to Dr Elvera LennoxGherghe r/t >temp amd >BP new orders received.

## 2017-04-16 NOTE — Progress Notes (Addendum)
PROGRESS NOTE  Kadia Abaya WUJ:[id number] DOB: [birth year] DOA: 04/10/2017 PCP: System, Provider Not In   LOS: 4 days   Brief Narrative / Interim history: Annali Lybrand is a 71 y.o. female with history of advanced dementia was brought to the ER with increasing confusion and behavioral disturbance. In the ER patient was seen by behavioral health team and advised inpatient psychiatric admission and patient was accepted at [facility]. Patient was waiting for transfer when patient's heart rate increased and was found to be in A. fib with RVR. Patient has advanced dementia and does not provide any history. Patient has a known history of A. fib and is on metoprolol and Cardizem for rate control and on Apixaban.  Clinically she improved, she was transitioned off of Cardizem infusion back on her oral regimen and was transferred to the floor on 9/11.  Assessment & Plan: Active Problems:   HTN (hypertension)   Dementia with behavioral disturbance   Atrial fibrillation with rapid ventricular response (HCC)   Thoracic aorta atherosclerosis (HCC)   A. fib with RVR -Patient was initiated on Cardizem infusion with improvement in her rates.  She was converted to p.o. metoprolol as well as carvedilol 9/6, however overnight towards 9/7 her rates have gone back up into the 150s.  She was started back again on Cardizem IV and digoxin, and transitioned back to p.o. 9/10 -Consulted cardiology, appreciate input, signed off from 9/10 -Continue anticoagulation with apixaban -Rates remain controlled, transferred to telemetry today  Sepsis due to community-acquired pneumonia -Meet sepsis criteria with fever, elevated heart rate, and a source -Started on ceftriaxone and azithromycin in the emergency room, continue for now. -Fever curve overall improvement, but still febrile. No abdominal pain, no other apparent sources. If fever persists, may need to re-image lungs. WBC has now normalized and expect fever to  improve within 24-48 hours.  Pro-calcitonin is now less than 0.1  Acute kidney injury -Likely due to dehydration, elevated heart rates as well as sepsis -Creatinine has now normalized  Dementia with behavioral disturbances -Will need psych placement once medical issues are improved -Discussed with patient's brother [name] over the phone, patient has been diagnosed with Alzheimer's dementia 4-5 years ago, however he feels like her dementia was stable up until 2 months ago when she had an abrupt decline in her mental status, she became more aggressive and intermittently nonverbal / catatonic.  He feels like these changes are abrupt and he believes that they are medication related despite the fact that patient has been on the same regimen for few years.  She had an episode when she was hospitalized at [facility] few months ago and she was discharged off of her Depakote, and feels like her behavior changed then, however Depakote was later added back on in her memory care unit.  I called and discussed with the nurse at [facility] home and she felt like her recent changes are not that abrupt and in fact were somewhat of a gradual progression. -Given concern for acute changes about 2 months ago, will obtain an MRI of the brain to rule out any organic causes -I have also consulted psychiatry to assist with her medication and see if any changes need to be made at this point or if the medications could be the reason for her mental status changes  Hypertension -Blood pressure improving and now on the hypertensive side  Mild thrombocytopenia -Stable, follow CBC, platelets 110, slightly worse likely in the setting of sepsis -Repeat  CBC in the morning    DVT prophylaxis: Apixaban Code Status: Full code Family Communication: No family at bedside, discussed with brother Betti CruzRoy Scioli more over the phone [phone]). Disposition Plan: Inpatient psychiatry when medically ready  Consultants:    Cardiology  Procedures:   None   Antimicrobials:  Ceftriaxone 9/6 >>  Azithromycin 9/6 >>  Subjective: -No complaints, selective in what questions she would answer  Objective: Vitals:   04/16/17 0600 04/16/17 0800 04/16/17 0900 04/16/17 1003  BP: (!) 104/45 (!) 159/69  (!) 153/72  Pulse: 71 88 91 99  Resp: (!) 22 (!) 21 (!) 23 (!) 25  Temp:  98.4 F (36.9 C)    TempSrc:  Oral    SpO2: 97% 96% 91% 98%  Weight:      Height:        Intake/Output Summary (Last 24 hours) at 04/16/17 1029 Last data filed at 04/16/17 1004  Gross per 24 hour  Intake             2090 ml  Output             1150 ml  Net              940 ml   Filed Weights   04/11/17 1913 04/12/17 0701  Weight: 62.2 kg (137 lb 3.2 oz) 62.2 kg (137 lb 2 oz)    Examination:  Vitals:   04/16/17 0600 04/16/17 0800 04/16/17 0900 04/16/17 1003  BP: (!) 104/45 (!) 159/69  (!) 153/72  Pulse: 71 88 91 99  Resp: (!) 22 (!) 21 (!) 23 (!) 25  Temp:  98.4 F (36.9 C)    TempSrc:  Oral    SpO2: 97% 96% 91% 98%  Weight:      Height:       Constitutional: NAD Eyes: Normal conjunctivae, no scleral icterus ENMT: moist mucous membranes Respiratory: Clear to auscultation, no wheezing Cardiovascular: Irregular, no lower extremity edema.  Good peripheral pulses. Abdomen: Nondistended, nontender.  Positive bowel sounds Neurologic: Nonfocal, moves all 4 extremities however intermittently does not follow commands   Data Reviewed: I have independently reviewed following labs and imaging studies   CBC:  Recent Labs Lab 04/10/17 1833 04/11/17 1803 04/12/17 0327 04/13/17 0329 04/14/17 0353 04/15/17 0757  WBC 6.0  --  7.9 11.7* 9.1 8.3  NEUTROABS 2.4  --   --   --   --   --   HGB 14.0 17.7* 14.3 14.8 14.5 13.2  HCT 43.1 52.0* 43.3 44.4 42.8 39.6  MCV 94.5  --  92.9 93.7 92.8 93.2  PLT 146*  --  138* 143* 112* 110*   Basic Metabolic Panel:  Recent Labs Lab 04/10/17 1833 04/11/17 1803 04/12/17 0327  04/13/17 0329 04/14/17 0353 04/15/17 0757  NA 142 147* 143 144 140 140  K 3.6 3.3* 3.5 3.8 3.6 3.8  CL 105 109 110 112* 111 110  CO2 30  --  23 24 22 22   GLUCOSE 92 126* 166* 105* 104* 95  BUN 17 11 12 7 8 10   CREATININE 1.12* 0.90 1.06* 0.78 0.66 0.61  CALCIUM 8.9  --  8.4* 8.8* 8.3* 8.4*   GFR: Estimated Creatinine Clearance: 58.6 mL/min (by C-G formula based on SCr of 0.61 mg/dL). Liver Function Tests:  Recent Labs Lab 04/10/17 1833  AST 17  ALT 14  ALKPHOS 86  BILITOT 0.6  PROT 6.4*  ALBUMIN 3.3*   No results for input(s): LIPASE, AMYLASE in the last  168 hours.  Recent Labs Lab 04/10/17 1833  AMMONIA 38*   Coagulation Profile: No results for input(s): INR, PROTIME in the last 168 hours. Cardiac Enzymes:  Recent Labs Lab 04/12/17 0548  TROPONINI 0.06*   BNP (last 3 results) No results for input(s): PROBNP in the last 8760 hours. HbA1C: No results for input(s): HGBA1C in the last 72 hours. CBG:  Recent Labs Lab 04/10/17 1833  GLUCAP 84   Lipid Profile: No results for input(s): CHOL, HDL, LDLCALC, TRIG, CHOLHDL, LDLDIRECT in the last 72 hours. Thyroid Function Tests: No results for input(s): TSH, T4TOTAL, FREET4, T3FREE, THYROIDAB in the last 72 hours. Anemia Panel: No results for input(s): VITAMINB12, FOLATE, FERRITIN, TIBC, IRON, RETICCTPCT in the last 72 hours. Urine analysis:    Component Value Date/Time   COLORURINE YELLOW 04/10/2017 1858   APPEARANCEUR CLEAR 04/10/2017 1858   LABSPEC 1.019 04/10/2017 1858   PHURINE 6.0 04/10/2017 1858   GLUCOSEU NEGATIVE 04/10/2017 1858   HGBUR NEGATIVE 04/10/2017 1858   BILIRUBINUR NEGATIVE 04/10/2017 1858   KETONESUR 5 (A) 04/10/2017 1858   PROTEINUR NEGATIVE 04/10/2017 1858   UROBILINOGEN 2.0 (H) 06/10/2015 1724   NITRITE NEGATIVE 04/10/2017 1858   LEUKOCYTESUR TRACE (A) 04/10/2017 1858   Sepsis Labs: Invalid input(s): PROCALCITONIN, LACTICIDVEN  Recent Results (from the past 240 hour(s))  MRSA  PCR Screening     Status: None   Collection Time: 04/11/17 10:30 PM  Result Value Ref Range Status   MRSA by PCR NEGATIVE NEGATIVE Final    Comment:        The GeneXpert MRSA Assay (FDA approved for NASAL specimens only), is one component of a comprehensive MRSA colonization surveillance program. It is not intended to diagnose MRSA infection nor to guide or monitor treatment for MRSA infections.       Radiology Studies: No results found.   Scheduled Meds: . apixaban  5 mg Oral BID  . azithromycin  500 mg Oral Daily  . busPIRone  7.5 mg Oral BID  . digoxin  0.25 mg Intravenous Daily  . diltiazem  90 mg Oral Q8H  . divalproex  250 mg Oral TID  . famotidine  20 mg Oral Daily  . LORazepam  0.5 mg Oral Q8H  . mouth rinse  15 mL Mouth Rinse BID  . memantine  10 mg Oral BID  . metoprolol succinate  100 mg Oral Daily  . potassium chloride SA  20 mEq Oral Daily  . traZODone  50 mg Oral QHS   Continuous Infusions: . cefTRIAXone (ROCEPHIN)  IV Stopped (04/15/17 2142)    Pamella Pert, MD, PhD [facility] Pager [phone]  If 7PM-7AM, please contact night-coverage [email or web address] Password [facility] 04/16/2017, 10:29 AM

## 2017-04-16 NOTE — Progress Notes (Signed)
Pharmacy Antibiotic Note  Sheryl Horn is a 71 y.o. female admitted on 04/10/2017 with pneumonia.  Pharmacy was been consulted for rocephin dosing and azithromycin per MD was started at that time.   04/16/17 Patient on Day #7 Azith/Ceftriaxone and patient continues to be febrile (Tmax 101.5).  MD has ordered Zosyn per RX to broaden coverage due to continued fevers.  Unknown source.  Plan: Zosyn 3.375gm IV q8h  Height: 5\' 6"  (167.6 cm) Weight: 137 lb 2 oz (62.2 kg) IBW/kg (Calculated) : 59.3  Temp (24hrs), Avg:99.7 F (37.6 C), Min:98 F (36.7 C), Max:101.5 F (38.6 C)   Recent Labs Lab 04/10/17 1833 04/11/17 1803 04/12/17 0327 04/12/17 0548 04/13/17 0329 04/14/17 0353 04/15/17 0757  WBC 6.0  --  7.9  --  11.7* 9.1 8.3  CREATININE 1.12* 0.90 1.06*  --  0.78 0.66 0.61  LATICACIDVEN  --   --   --  2.2*  --   --   --     Estimated Creatinine Clearance: 58.6 mL/min (by C-G formula based on SCr of 0.61 mg/dL).    Allergies  Allergen Reactions  . Bacitracin Other (See Comments)    Reaction unknown per MAR  . Other Other (See Comments)    HMG-COA reductase inhibitors - reaction unknown per MAR  . Penicillins Other (See Comments)    Reaction unknown per MAR  . Statins Other (See Comments)    [facility]    Antimicrobials this admission: 9/6 rocephin >> 9/11 9/6 zmax >> 9/11 9/11 zosyn >>  Dose adjustments this admission:   Microbiology results:  BCx:   UCx:    Sputum:    MRSA PCR: negative  Thank you for allowing pharmacy to be a part of this patient's care.  Maryellen PilePoindexter, Abbegail Matuska Trefz, PharmD 04/16/2017 5:33 PM

## 2017-04-16 NOTE — Consult Note (Signed)
[place] Psychiatry Consult   Reason for Consult:  Dementia Referring Physician:  EDP Patient Identification: Sheryl Horn MRN:  [id number] Principal Diagnosis: <principal problem not specified> Diagnosis:   Patient Active Problem List   Diagnosis Date Noted  . Thoracic aorta atherosclerosis ([place]) [I70.0] 04/13/2017  . Atrial fibrillation with rapid ventricular response ([place]) [I48.91] 04/11/2017  . Encounter for palliative care [Z51.5]   . Goals of care, counseling/discussion [Z71.89]   . Acute encephalopathy [G93.40] 06/23/2015  . Malnutrition of moderate degree [E44.0] 06/23/2015  . HCAP (healthcare-associated pneumonia) [J18.9] 06/10/2015  . Atrial fibrillation ([place]) [I48.91] 06/10/2015  . HTN (hypertension) [I10] 06/10/2015  . Dementia with behavioral disturbance [F03.91] 06/10/2015  . Anxiety [F41.9] 06/10/2015    Total Time spent with patient: 45 minutes  Subjective:   Sheryl Horn is a 71 y.o. female patient admitted with advanced demnetia.  HPI: Sheryl Horn is a 71 years old female admitted to [facility] for managing atrial fibrillation with RVR. Patient was initially brought in by emergency medical services with the request of patient Brother for decreased mental status from her baseline and also reportedly being agitated at the skilled nursing facility. Patient knows her first name, last name and being in a hospital but not able to provide any verbal responses regarding Mini-Mental Status Examination. Spoke with the patient Brother who is a medical care power of attorney reported patient is much better baseline in July 2018 reportedly able to communicate with the and able to walk around and has little difficulty eating. He also feels that facility provider has been providing medication with the higher doses each may be reason for current mental status. Patient also known as bipolar disorder and also disrobbing herself in the facility without identifying her  surroundings. [facility] emergency psychiatrist recommended inpatient geriatric hospitalization for psychiatric treatment needs once medically stable. Patient Brother requesting to review her medications and adjust as required to make her back to her baseline functioning.   Medical history: Sheryl Horn a 71 y.o.femalewith history of advanced dementia was brought to the ER with increasing confusion and behavioral disturbance. In the ER patient was seen by behavioral health team and advised inpatient psychiatric admission and patient was accepted at [facility]. Patient was waiting for transfer when patient's heart rate increased and was found to be in A. fib with RVR. Patient has advanced dementia and does not provide any history. Patient has a known history of A. fib and is on metoprolol and Cardizem for rate control and on Apixaban.  Clinically she improved, she was transitioned off of Cardizem infusion back on her oral regimen and was transferred to the floor on 9/11.   Past Psychiatric History: patient has a long history of bipolar disorder and now with the advanced stages of dementia.   Risk to Self: Suicidal Ideation: No Suicidal Intent: No Is patient at risk for suicide?: No Suicidal Plan?: No Access to Means: No What has been your use of drugs/alcohol within the last 12 months?:  (per brother... "Patient is a former alcoholic") How many times?:  (0) Other Self Harm Risks:  (no self harm ) Triggers for Past Attempts: Other (Comment) (no triggers for past attempts or gestures ) Intentional Self Injurious Behavior: None Risk to Others: Homicidal Ideation: No Thoughts of Harm to Others: No Current Homicidal Intent: No Current Homicidal Plan: No Access to Homicidal Means: No Identified Victim:  (n/a) History of harm to others?: No Assessment of Violence: None Noted Violent Behavior  Description:  (patient is calm and cooperative ) Does patient have access to weapons?:  No Criminal Charges Pending?: No Does patient have a court date: No Prior Inpatient Therapy:   Prior Outpatient Therapy: Prior Outpatient Therapy: No Prior Therapy Dates:  (n/a) Prior Therapy Facilty/Provider(s):  (n/a) Reason for Treatment:  (n/a') Does patient have an ACCT team?: No Does patient have Intensive In-House Services?  : No Does patient have [facility] services? : No Does patient have P4CC services?: No  Past Medical History:  Past Medical History:  Diagnosis Date  . A-fib ([place])   . Anxiety   . Dementia   . Depression   . Hypertension    History reviewed. No pertinent surgical history. Family History:  Family History  Problem Relation Age of Onset  . Family history unknown: Yes   Family Psychiatric  History: Unknown Social History:  History  Alcohol Use No     History  Drug Use No    Social History   Social History  . Marital status: Unknown    Spouse name: N/A  . Number of children: N/A  . Years of education: N/A   Social History Main Topics  . Smoking status: Never Smoker  . Smokeless tobacco: Current User    Types: Snuff, Chew  . Alcohol use No  . Drug use: No  . Sexual activity: No   Other Topics Concern  . None   Social History Narrative  . None   Additional Social History:    Allergies:   Allergies  Allergen Reactions  . Bacitracin Other (See Comments)    Reaction unknown per MAR  . Other Other (See Comments)    HMG-COA reductase inhibitors - reaction unknown per MAR  . Penicillins Other (See Comments)    Reaction unknown per MAR  . Statins Other (See Comments)    Reaction unknown per [facility]    Labs:  Results for orders placed or performed during the hospital encounter of 04/10/17 (from the past 48 hour(s))  Basic metabolic panel     Status: Abnormal   Collection Time: 04/15/17  7:57 AM  Result Value Ref Range   Sodium 140 135 - 145 mmol/L   Potassium 3.8 3.5 - 5.1 mmol/L   Chloride 110 101 - 111 mmol/L   CO2 22 22 - 32  mmol/L   Glucose, Bld 95 65 - 99 mg/dL   BUN 10 6 - 20 mg/dL   Creatinine, Ser 0.61 0.44 - 1.00 mg/dL   Calcium 8.4 (L) 8.9 - 10.3 mg/dL   GFR calc non Af Amer >60 >60 mL/min   GFR calc Af Amer >60 >60 mL/min    Comment: (NOTE) The eGFR has been calculated using the CKD EPI equation. This calculation has not been validated in all clinical situations. eGFR's persistently <60 mL/min signify possible Chronic Kidney Disease.    Anion gap 8 5 - 15  CBC     Status: Abnormal   Collection Time: 04/15/17  7:57 AM  Result Value Ref Range   WBC 8.3 4.0 - 10.5 K/uL   RBC 4.25 3.87 - 5.11 MIL/uL   Hemoglobin 13.2 12.0 - 15.0 g/dL   HCT 39.6 36.0 - 46.0 %   MCV 93.2 78.0 - 100.0 fL   MCH 31.1 26.0 - 34.0 pg   MCHC 33.3 30.0 - 36.0 g/dL   RDW 14.8 11.5 - 15.5 %   Platelets 110 (L) 150 - 400 K/uL    Comment: CONSISTENT WITH PREVIOUS RESULT  Procalcitonin     Status: None   Collection Time: 04/16/17  5:16 AM  Result Value Ref Range   Procalcitonin <0.10 ng/mL    Comment:        Interpretation: PCT (Procalcitonin) <= 0.5 ng/mL: Systemic infection (sepsis) is not likely. Local bacterial infection is possible. (NOTE)         ICU PCT Algorithm               Non ICU PCT Algorithm    ----------------------------     ------------------------------         PCT < 0.25 ng/mL                 PCT < 0.1 ng/mL     Stopping of antibiotics            Stopping of antibiotics       strongly encouraged.               strongly encouraged.    ----------------------------     ------------------------------       PCT level decrease by               PCT < 0.25 ng/mL       >= 80% from peak PCT       OR PCT 0.25 - 0.5 ng/mL          Stopping of antibiotics                                             encouraged.     Stopping of antibiotics           encouraged.    ----------------------------     ------------------------------       PCT level decrease by              PCT >= 0.25 ng/mL       < 80% from peak  PCT        AND PCT >= 0.5 ng/mL            Continuin g antibiotics                                              encouraged.       Continuing antibiotics            encouraged.    ----------------------------     ------------------------------     PCT level increase compared          PCT > 0.5 ng/mL         with peak PCT AND          PCT >= 0.5 ng/mL             Escalation of antibiotics                                          strongly encouraged.      Escalation of antibiotics        strongly encouraged.     Current Facility-Administered Medications  Medication Dose Route Frequency Provider Last Rate Last Dose  . acetaminophen (TYLENOL) tablet 650 mg  650  mg Oral Q6H PRN Rise Patience, MD   650 mg at 04/14/17 0522   Or  . acetaminophen (TYLENOL) suppository 650 mg  650 mg Rectal Q6H PRN Rise Patience, MD      . albuterol (PROVENTIL) (2.5 MG/3ML) 0.083% nebulizer solution 3 mL  3 mL Inhalation Q6H PRN Rise Patience, MD      . apixaban Arne Cleveland) tablet 5 mg  5 mg Oral BID Dorie Rank, MD   5 mg at 04/16/17 0942  . asenapine (SAPHRIS) sublingual tablet 5 mg  5 mg Sublingual Q12H PRN Caren Griffins, MD   5 mg at 04/13/17 0450  . azithromycin (ZITHROMAX) tablet 500 mg  500 mg Oral Daily Leodis Sias T, RPH   500 mg at 04/16/17 0943  . busPIRone (BUSPAR) tablet 7.5 mg  7.5 mg Oral BID Darleene Cleaver, Mojeed, MD   7.5 mg at 04/16/17 0942  . cefTRIAXone (ROCEPHIN) 1 g in dextrose 5 % 50 mL IVPB  1 g Intravenous QHS Dorrene German, [facility]   Stopped at 04/15/17 2142  . digoxin (LANOXIN) 0.25 MG/ML injection 0.25 mg  0.25 mg Intravenous Daily Caren Griffins, MD   0.25 mg at 04/16/17 0957  . diltiazem (CARDIZEM) tablet 90 mg  90 mg Oral Q8H Caren Griffins, MD   90 mg at 04/16/17 0513  . divalproex (DEPAKOTE SPRINKLE) capsule 250 mg  250 mg Oral TID Corena Pilgrim, MD   250 mg at 04/16/17 0943  . famotidine (PEPCID) tablet 20 mg  20 mg Oral Daily Dorie Rank, MD   20 mg  at 04/16/17 0943  . haloperidol lactate (HALDOL) injection 2 mg  2 mg Intravenous Q6H PRN Opyd, Ilene Qua, MD      . hydrALAZINE (APRESOLINE) injection 10 mg  10 mg Intravenous Q4H PRN Caren Griffins, MD   10 mg at 04/13/17 1715  . LORazepam (ATIVAN) tablet 0.5 mg  0.5 mg Oral Q8H Rise Patience, MD   0.5 mg at 04/16/17 0513  . MEDLINE mouth rinse  15 mL Mouth Rinse BID Caren Griffins, MD   15 mL at 04/16/17 0943  . memantine (NAMENDA) tablet 10 mg  10 mg Oral BID Corena Pilgrim, MD   10 mg at 04/16/17 0942  . metoprolol succinate (TOPROL-XL) 24 hr tablet 100 mg  100 mg Oral Daily Jacolyn Reedy, MD   100 mg at 04/16/17 0942  . metoprolol tartrate (LOPRESSOR) injection 5 mg  5 mg Intravenous Q1H PRN Caren Griffins, MD   5 mg at 04/13/17 0707  . ondansetron (ZOFRAN) tablet 4 mg  4 mg Oral Q6H PRN Rise Patience, MD       Or  . ondansetron [facility]) injection 4 mg  4 mg Intravenous Q6H PRN Rise Patience, MD      . potassium chloride SA (K-DUR,KLOR-CON) CR tablet 20 mEq  20 mEq Oral Daily Dorie Rank, MD   20 mEq at 04/16/17 0943  . traZODone (DESYREL) tablet 50 mg  50 mg Oral QHS Corena Pilgrim, MD   50 mg at 04/15/17 2117    Musculoskeletal: Strength & Muscle Tone: within normal limits Gait & Station: normal Patient leans: N/A  Psychiatric Specialty Exam: Physical Exam  Constitutional: She appears well-developed.  HENT:  Head: Normocephalic.  Respiratory: Effort normal.  Psychiatric: She is agitated and aggressive. She expresses inappropriate judgment. She exhibits a depressed mood. She is noncommunicative. She exhibits abnormal recent memory and abnormal remote memory.  Review of Systems  Psychiatric/Behavioral: Positive for depression and memory loss (advanced dementia). Negative for hallucinations, substance abuse and suicidal ideas. The patient is not nervous/anxious and does not have insomnia.   All other systems reviewed and are negative.    Blood pressure (!) 157/75, pulse 93, temperature 98 F (36.7 C), temperature source Oral, resp. rate (!) 22, height 5' 6" (1.676 m), weight 62.2 kg (137 lb 2 oz), SpO2 95 %.Body mass index is 22.13 kg/m.  General Appearance: Disheveled  Eye Contact:  Fair  Speech:  Slow and nonsensical  Volume:  Normal  Mood:  Depressed and aggitated  Affect:  dementia  Thought Process:  Disorganized  Orientation:  Other:  Dementia  Thought Content:  Dementia  Suicidal Thoughts:  No  Homicidal Thoughts:  No  Memory:  Dementia  Judgement:  Other:  Dementia  Insight:  Dementia  Psychomotor Activity:  Increased  Concentration:  Concentration: Dementia and Attention Span: Dementia  Recall:  Dementia  Fund of Knowledge:  Dementia  Language:  Fair  Akathisia:  No  Handed:  Right  AIMS (if indicated):     Assets:  Financial Resources/Insurance Housing Social Support  ADL's:  Impaired, dementia  Cognition:  Impaired,  Severe  Sleep:        Treatment Plan Summary: 71 years old female with the advanced dementia and history of bipolar disorder,from skilled nursing facility with memory care unit admitted to [facility] for increased agitation and decreased mental status.  Daily contact with patient to assess and evaluate symptoms and progress in treatment and Medication management   Recommendation: Saphris sublingual 5 mg twice daily as needed for agitation Depakote sprinkles 250 mg 3 times daily for mood swings Trazodone 50 mg at bedtime for insomnia Discontinue Buspar - not helpful Decrease Nemenda 10 mg QD for dementia Appreciate psychiatric consultation and follow up as clinically required Please contact [phone] or [phone] if needs further assistance  Disposition: Recommend psychiatric Inpatient admission when medically cleared.  Unit CSW to seek placement  Ambrose Finland, MD 04/16/2017 12:14 PM

## 2017-04-16 NOTE — Care Management Note (Signed)
Case Management Note  Patient Details  Name: Sheryl Horn MRN: [id number] Date of Birth: 05/08/1946  Subjective/Objective: transfer from SDU. afib w/rvr,dementia,pna. Psych CSW already following for IVC. From [facility] memory care.                   Action/Plan:d/c plan IP Psych.   Expected Discharge Date:   (unknown)               Expected Discharge Plan:  Psychiatric Hospital  In-House Referral:  Clinical Social Work  Discharge planning Services  CM Consult  Post Acute Care Choice:    Choice offered to:     DME Arranged:    DME Agency:     HH Arranged:    HH Agency:     Status of Service:  In process, will continue to follow  If discussed at Long Length of Stay Meetings, dates discussed:    Additional Comments:  Lanier ClamMahabir, Lanna Labella, RN 04/16/2017, 12:32 PM

## 2017-04-16 NOTE — Progress Notes (Signed)
Back from MRI.

## 2017-04-17 ENCOUNTER — Inpatient Hospital Stay (HOSPITAL_COMMUNITY): Payer: Medicare Other

## 2017-04-17 DIAGNOSIS — I63442 Cerebral infarction due to embolism of left cerebellar artery: Secondary | ICD-10-CM

## 2017-04-17 DIAGNOSIS — I340 Nonrheumatic mitral (valve) insufficiency: Secondary | ICD-10-CM

## 2017-04-17 LAB — CBC
HEMATOCRIT: 42.3 % (ref 36.0–46.0)
HEMOGLOBIN: 14.2 g/dL (ref 12.0–15.0)
MCH: 30.7 pg (ref 26.0–34.0)
MCHC: 33.6 g/dL (ref 30.0–36.0)
MCV: 91.6 fL (ref 78.0–100.0)
Platelets: 150 10*3/uL (ref 150–400)
RBC: 4.62 MIL/uL (ref 3.87–5.11)
RDW: 14.4 % (ref 11.5–15.5)
WBC: 8.8 10*3/uL (ref 4.0–10.5)

## 2017-04-17 LAB — ECHOCARDIOGRAM COMPLETE
HEIGHTINCHES: 66 in
WEIGHTICAEL: 2194.02 [oz_av]

## 2017-04-17 LAB — BASIC METABOLIC PANEL
ANION GAP: 11 (ref 5–15)
BUN: 9 mg/dL (ref 6–20)
CO2: 23 mmol/L (ref 22–32)
Calcium: 8.8 mg/dL — ABNORMAL LOW (ref 8.9–10.3)
Chloride: 102 mmol/L (ref 101–111)
Creatinine, Ser: 0.56 mg/dL (ref 0.44–1.00)
GFR calc Af Amer: >60 mL/min (ref >60)
GLUCOSE: 94 mg/dL (ref 65–99)
POTASSIUM: 3.6 mmol/L (ref 3.5–5.1)
Sodium: 136 mmol/L (ref 135–145)

## 2017-04-17 LAB — BRAIN NATRIURETIC PEPTIDE: B Natriuretic Peptide: 442.4 pg/mL — ABNORMAL HIGH (ref 0.0–100.0)

## 2017-04-17 MED ORDER — FOOD THICKENER (SIMPLYTHICK): 1.0000 | Freq: Three times a day (TID) | ORAL | Status: DC

## 2017-04-17 MED ORDER — DEXTROSE 5 % IV SOLN
500.0000 mg | INTRAVENOUS | Status: DC
  Administered 2017-04-17 – 2017-04-19 (×3): 500 mg via INTRAVENOUS
  Filled 2017-04-17 (×3): qty 500

## 2017-04-17 MED ORDER — FUROSEMIDE 10 MG/ML IJ SOLN
20.0000 mg | Freq: Every day | INTRAMUSCULAR | Status: DC
  Administered 2017-04-17 – 2017-04-18 (×2): 20 mg via INTRAVENOUS
  Filled 2017-04-17 (×2): qty 2

## 2017-04-17 MED ORDER — POTASSIUM CHLORIDE CRYS ER 20 MEQ PO TBCR
20.0000 meq | EXTENDED_RELEASE_TABLET | Freq: Every day | ORAL | Status: DC
  Administered 2017-04-17 – 2017-04-22 (×6): 20 meq via ORAL
  Filled 2017-04-17 (×5): qty 1

## 2017-04-17 MED ORDER — DONEPEZIL HCL 5 MG PO TABS
5.0000 mg | ORAL_TABLET | Freq: Every day | ORAL | Status: DC
  Administered 2017-04-17 – 2017-04-21 (×5): 5 mg via ORAL
  Filled 2017-04-17 (×5): qty 1

## 2017-04-17 MED ORDER — STROKE: EARLY STAGES OF RECOVERY BOOK
Freq: Once | Status: AC
  Administered 2017-04-17: 12:00:00
  Filled 2017-04-17: qty 1

## 2017-04-17 MED ORDER — RESOURCE THICKENUP CLEAR PO POWD
1.0000 g | Freq: Three times a day (TID) | ORAL | Status: DC
  Administered 2017-04-17 – 2017-04-22 (×10): 1 g via ORAL
  Filled 2017-04-17: qty 125

## 2017-04-17 NOTE — Progress Notes (Addendum)
PROGRESS NOTE  Sheryl Horn ZOX:[id number] DOB: 1946-05-02 DOA: 04/10/2017 PCP: System, Provider Not In   LOS: 5 days   Brief Narrative / Interim history: Sheryl Horn is a 71 y.o. female with history of advanced dementia was brought to the ER with increasing confusion and behavioral disturbance. In the ER patient was seen by behavioral health team and advised inpatient psychiatric admission and patient was accepted at [facility]. Patient was waiting for transfer when patient's heart rate increased and was found to be in A. fib with RVR. Patient has advanced dementia and does not provide any history. Patient has a known history of A. fib and is on metoprolol and Cardizem for rate control and on Apixaban.  Clinically she improved, she was transitioned off of Cardizem infusion back on her oral regimen and was transferred to the floor on 9/11.  Assessment & Plan: Active Problems:   HTN (hypertension)   Dementia with behavioral disturbance   Atrial fibrillation with rapid ventricular response (HCC)   Thoracic aorta atherosclerosis (HCC)  Acute left cerebellar stoke;  PT, OT, Speech evaluation.  Neurology consulted, plan to transfer to [facility].  Already on eliquis Check blood culture.  Will like neurology to comment if patient currents symptoms explain current mental changes ?  Patient 's brother  would like to speak with neurology.  Per brother patient was walking all over place last Wednesday. Son updated for second time today.   Acute Hypoxic Respiratory Failure;  Related to PNA.  Chest x ray with PNA vs pulmonary edema.  Having fevers. Antibiotics change to zosyn and azithromycin.  Start IV lasix 20 mg IV daily. Patient takes 40 mg daily lasix at home.   A. fib with RVR -Patient was initiated on Cardizem infusion with improvement in her rates.  She was converted to p.o. metoprolol as well as carvedilol 9/6, however overnight towards 9/7 her rates have gone back up into the  150s.  She was started back again on Cardizem IV and digoxin, and transitioned back to p.o. 9/10 -Consulted cardiology, appreciate input, signed off from 9/10 -Continue anticoagulation with apixaban -Rate controlled. Continue with metoprolol and Cardizem.   Sepsis due to pneumonia -Meet sepsis criteria with fever, elevated heart rate, and a source -Started on ceftriaxone and azithromycin in the emergency room. Received 3 days.  -continue to spike fever, ct chest showed multifocal pneumonia.  -Started IV zosyn 9-12, restarted azithromycin 9-2 -  Acute kidney injury -Likely due to dehydration, elevated heart rates as well as sepsis -Creatinine has now normalized  Dementia with behavioral disturbances -Will need psych placement once medical issues are improved Discussed  Gherghe  with patient's brother Sheryl Horn over the phone, patient has been diagnosed with Alzheimer's dementia 4-5 years ago, however he feels like her dementia was stable up until 2 months ago when she had an abrupt decline in her mental status, she became more aggressive and intermittently nonverbal / catatonic.  He feels like these changes are abrupt and he believes that they are medication related despite the fact that patient has been on the same regimen for few years.  She had an episode when she was hospitalized at [facility] few months ago and she was discharged off of her Depakote, and feels like her behavior changed then, however Depakote was later added back on in her memory care unit.  I called and discussed with the nurse at [facility] and she felt like her recent changes are not that abrupt  and in fact were somewhat of a gradual progression. -Given concern for acute changes about 2 months ago, an MRI of the brain was obtain  to rule out any organic causes. MRI positive for stroke.  -Psych consulted also.   Hypertension -Blood pressure improving and now on the hypertensive side  Mild  thrombocytopenia -Stable, follow CBC, platelets 110, slightly worse likely in the setting of sepsis -Repeat CBC in the morning    DVT prophylaxis: Apixaban Code Status: Full code Family Communication: No family at bedside, discussed with brother Sheryl Horn more over the phone [phone]). Disposition Plan: Inpatient psychiatry when medically ready , [facility] for stroke team evaluation.   Consultants:   Cardiology  Procedures:   None   Antimicrobials:  Ceftriaxone 9/6 >>9-12  Azithromycin 9/6 >>  Subjective: -no complaints. Breathing comfortable.  -answer few questions.  Does not follows command.   Objective: Vitals:   04/16/17 1656 04/16/17 1756 04/16/17 2118 04/17/17 0542  BP: (!) 192/80 (!) 146/66 (!) 165/63 (!) 154/77  Pulse: (!) 112 95 93 91  Resp: (!) Temp: (!) 101.5 F (38.6 C) 99.7 F (37.6 C) 98.4 F (36.9 C) 99 F (37.2 C)  TempSrc: Rectal Oral Oral Oral  SpO2: 95%  93% 93%  Weight:      Height:        Intake/Output Summary (Last 24 hours) at 04/17/17 0850 Last data filed at 04/17/17 0820  Gross per 24 hour  Intake              520 ml  Output             2000 ml  Net            -1480 ml   Filed Weights   04/11/17 1913 04/12/17 0701  Weight: 62.2 kg (137 lb 3.2 oz) 62.2 kg (137 lb 2 oz)    Examination:  Vitals:   04/16/17 1656 04/16/17 1756 04/16/17 2118 04/17/17 0542  BP: (!) 192/80 (!) 146/66 (!) 165/63 (!) 154/77  Pulse: (!) 112 95 93 91  Resp: (!) Temp: (!) 101.5 F (38.6 C) 99.7 F (37.6 C) 98.4 F (36.9 C) 99 F (37.2 C)  TempSrc: Rectal Oral Oral Oral  SpO2: 95%  93% 93%  Weight:      Height:       Constitutional: NAD Eyes: Normal conjunctivae, no scleral icterus ENMT: moist mucous membranes Respiratory: Clear to auscultation, no wheezing Cardiovascular: Irregular, no lower extremity edema.  Good peripheral pulses. Abdomen: Nondistended, nontender.  Positive bowel sounds Neurologic:  Nonfocal, moves all 4 extremities however intermittently does not follow commands   Data Reviewed: I have independently reviewed following labs and imaging studies   CBC:  Recent Labs Lab 04/10/17 1833  04/12/17 0327 04/13/17 0329 04/14/17 0353 04/15/17 0757 04/17/17 0600  WBC 6.0  --  7.9 11.7* 9.1 8.3 8.8  NEUTROABS 2.4  --   --   --   --   --   --   HGB 14.0  < > 14.3 14.8 14.5 13.2 14.2  HCT 43.1  < > 43.3 44.4 42.8 39.6 42.3  MCV 94.5  --  92.9 93.7 92.8 93.2 91.6  PLT 146*  --  138* 143* 112* 110* 150  < > = values in this interval not displayed. Basic Metabolic Panel:  Recent Labs Lab 04/12/17 0327 04/13/17 0329 04/14/17 0353 04/15/17 0757 04/17/17 0600  NA 143 144  140 140 136  K 3.5 3.8 3.6 3.8 3.6  CL 110 112* 111 110 102  CO2 GLUCOSE 166* 105* 104* 95 94  BUN CREATININE 1.06* 0.78 0.66 0.61 0.56  CALCIUM 8.4* 8.8* 8.3* 8.4* 8.8*   GFR: Estimated Creatinine Clearance: 58.6 mL/min (by C-G formula based on SCr of 0.56 mg/dL). Liver Function Tests:  Recent Labs Lab 04/10/17 1833  AST 17  ALT 14  ALKPHOS 86  BILITOT 0.6  PROT 6.4*  ALBUMIN 3.3*   No results for input(s): LIPASE, AMYLASE in the last 168 hours.  Recent Labs Lab 04/10/17 1833  AMMONIA 38*   Coagulation Profile: No results for input(s): INR, PROTIME in the last 168 hours. Cardiac Enzymes:  Recent Labs Lab 04/12/17 0548  TROPONINI 0.06*   BNP (last 3 results) No results for input(s): PROBNP in the last 8760 hours. HbA1C: No results for input(s): HGBA1C in the last 72 hours. CBG:  Recent Labs Lab 04/10/17 1833  GLUCAP 84   Lipid Profile: No results for input(s): CHOL, HDL, LDLCALC, TRIG, CHOLHDL, LDLDIRECT in the last 72 hours. Thyroid Function Tests: No results for input(s): TSH, T4TOTAL, FREET4, T3FREE, THYROIDAB in the last 72 hours. Anemia Panel: No results for input(s): VITAMINB12, FOLATE, FERRITIN, TIBC, IRON, RETICCTPCT in the  last 72 hours. Urine analysis:    Component Value Date/Time   COLORURINE YELLOW 04/10/2017 1858   APPEARANCEUR CLEAR 04/10/2017 1858   LABSPEC 1.019 04/10/2017 1858   PHURINE 6.0 04/10/2017 1858   GLUCOSEU NEGATIVE 04/10/2017 1858   HGBUR NEGATIVE 04/10/2017 1858   BILIRUBINUR NEGATIVE 04/10/2017 1858   KETONESUR 5 (A) 04/10/2017 1858   PROTEINUR NEGATIVE 04/10/2017 1858   UROBILINOGEN 2.0 (H) 06/10/2015 1724   NITRITE NEGATIVE 04/10/2017 1858   LEUKOCYTESUR TRACE (A) 04/10/2017 1858   Sepsis Labs: Invalid input(s): PROCALCITONIN, LACTICIDVEN  Recent Results (from the past 240 hour(s))  MRSA PCR Screening     Status: None   Collection Time: 04/11/17 10:30 PM  Result Value Ref Range Status   MRSA by PCR NEGATIVE NEGATIVE Final    Comment:        The GeneXpert MRSA Assay (FDA approved for NASAL specimens only), is one component of a comprehensive MRSA colonization surveillance program. It is not intended to diagnose MRSA infection nor to guide or monitor treatment for MRSA infections.       Radiology Studies: Ct Chest W Contrast  Result Date: 04/17/2017 CLINICAL DATA:  Sepsis secondary to community acquired pneumonia in a patient with dementia. Acute kidney injury. EXAM: CT CHEST, ABDOMEN, AND PELVIS WITH CONTRAST TECHNIQUE: Multidetector CT imaging of the chest, abdomen and pelvis was performed following the standard protocol during bolus administration of intravenous contrast. CONTRAST:  100 ml ISOVUE-300 IOPAMIDOL (ISOVUE-300) INJECTION 61% COMPARISON:  PA and lateral chest 04/10/2017 and 06/22/2015. FINDINGS: CT CHEST FINDINGS Cardiovascular: There is cardiomegaly. No pericardial effusion. Extensive calcific aortic and coronary atherosclerosis is identified. The pulmonary outflow trunk and right and left main pulmonary arteries are prominent suggestive of pulmonary arterial hypertension. Mediastinum/Nodes: AP window node on image 18 measures 1.4 cm short axis dimension  right precarinal node on image 20 measures 1.6 cm short axis dimension. Finally, right hilar node on image 25 measures 1.1 cm short axis dimension. Lungs/Pleura: Small bilateral pleural effusions are identified. Extensive airspace disease throughout all lobes of both lungs is identified and appears somewhat worst in the upper lobes bilaterally. Some areas of  opacities in the upper lobes have a crazy paving type appearance. Musculoskeletal: No acute abnormality. No lytic or sclerotic lesion. Multifocal spondylosis noted. CT ABDOMEN PELVIS FINDINGS Hepatobiliary: The liver is low attenuating consistent with fatty infiltration. No focal lesion. Gallbladder and biliary tree appear normal. Pancreas: Unremarkable. No pancreatic ductal dilatation or surrounding inflammatory changes. Spleen: Normal in size without focal abnormality. Adrenals/Urinary Tract: Small cyst in the lower pole of the left kidney is noted. The kidneys otherwise appear normal. 1 cm nodule in the left adrenal gland cannot be definitively characterized but is likely a benign adenoma. The right adrenal gland appears normal. Urinary bladder is unremarkable. Stomach/Bowel: Small hiatal hernia is seen. The stomach is otherwise unremarkable. Small and large bowel appear normal. The appendix is not visualized but there is no evidence of appendicitis. Vascular/Lymphatic: Extensive aortoiliac atherosclerosis without aneurysm. No lymphadenopathy. Reproductive: Status post hysterectomy. No adnexal masses. Other: No fluid collection.  Mild body wall edema is noted. Musculoskeletal: No acute or focal bony abnormality. Multilevel lumbar spondylosis is noted. IMPRESSION: Extensive airspace disease throughout the chest could be due to multifocal pneumonia, ARDS or pulmonary edema. Marked cardiomegaly. Small mediastinal lymph nodes are presumably reactive. Small bilateral pleural effusions. Prominence of the pulmonary arteries compatible with pulmonary arterial  hypertension. Extensive calcific aortic and coronary atherosclerosis. No acute abnormality abdomen or pelvis. Fatty infiltration of the liver. Electronically Signed   By: Drusilla Kanner M.D.   On: 04/17/2017 00:57   Mr Laqueta Jean ZO Contrast  Result Date: 04/16/2017 CLINICAL DATA:  Initial evaluation for a advanced dementia with increasing confusion and behavioral disturbance. EXAM: MRI HEAD WITHOUT AND WITH CONTRAST TECHNIQUE: Multiplanar, multiecho pulse sequences of the brain and surrounding structures were obtained without and with intravenous contrast. CONTRAST:  12mL MULTIHANCE GADOBENATE DIMEGLUMINE 529 MG/ML IV SOLN COMPARISON:  Comparison made with prior CT from 04/10/2017. FINDINGS: Brain: Study moderately degraded by motion artifact. Diffuse prominence of the CSF containing spaces compatible with generalized cerebral atrophy. Patchy and confluent T2/FLAIR hyperintensity within the periventricular and deep white matter both cerebral hemispheres most consistent with chronic micro vessel ischemic disease. Overall, changes are moderate nature. Patchy approximately 2 cm acute ischemic infarct within the mid left cerebellar hemisphere (series 4, image 15). No associated mass effect or hemorrhage. No other areas of acute or subacute infarction. Gray-white matter differentiation otherwise maintained. No other encephalomalacia to suggest chronic infarction. No evidence for acute or chronic intracranial hemorrhage. No mass lesion, midline shift or mass effect. Mild ventricular prominence related to global parenchymal volume loss without hydrocephalus. No extra-axial fluid collection. Major dural sinuses are grossly patent. No definite abnormal enhancement, although postcontrast sequence Ing is limited by extensive motion. Pituitary gland suprasellar region grossly within normal limits. Vascular: Major intracranial vascular flow voids are maintained. Skull and upper cervical spine: Craniocervical junction within  normal limits. Visualized upper cervical spine unremarkable. Bone marrow signal intensity within normal limits. No scalp soft tissue abnormality. Sinuses/Orbits: Globes and orbital soft tissues within normal limits. Patient status post lens extraction bilaterally. Scattered mucosal thickening within the left ethmoidal air cells and left maxillary sinus. Superimposed fluid level noted within left maxillary sinus. Paranasal sinuses otherwise clear. Trace opacity noted within the right mastoid air cells. Inner ear structures grossly normal. Other: None. IMPRESSION: 1. 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 2. Moderate cerebral atrophy with chronic microvascular ischemic disease. 3. Acute left maxillary and ethmoidal sinusitis. Electronically Signed   By: Rise Mu M.D.   On: 04/16/2017  19:21   Ct Abdomen Pelvis W Contrast  Result Date: 04/17/2017 CLINICAL DATA:  Sepsis secondary to community acquired pneumonia in a patient with dementia. Acute kidney injury. EXAM: CT CHEST, ABDOMEN, AND PELVIS WITH CONTRAST TECHNIQUE: Multidetector CT imaging of the chest, abdomen and pelvis was performed following the standard protocol during bolus administration of intravenous contrast. CONTRAST:  100 ml ISOVUE-300 IOPAMIDOL (ISOVUE-300) INJECTION 61% COMPARISON:  PA and lateral chest 04/10/2017 and 06/22/2015. FINDINGS: CT CHEST FINDINGS Cardiovascular: There is cardiomegaly. No pericardial effusion. Extensive calcific aortic and coronary atherosclerosis is identified. The pulmonary outflow trunk and right and left main pulmonary arteries are prominent suggestive of pulmonary arterial hypertension. Mediastinum/Nodes: AP window node on image 18 measures 1.4 cm short axis dimension right precarinal node on image 20 measures 1.6 cm short axis dimension. Finally, right hilar node on image 25 measures 1.1 cm short axis dimension. Lungs/Pleura: Small bilateral pleural effusions are  identified. Extensive airspace disease throughout all lobes of both lungs is identified and appears somewhat worst in the upper lobes bilaterally. Some areas of opacities in the upper lobes have a crazy paving type appearance. Musculoskeletal: No acute abnormality. No lytic or sclerotic lesion. Multifocal spondylosis noted. CT ABDOMEN PELVIS FINDINGS Hepatobiliary: The liver is low attenuating consistent with fatty infiltration. No focal lesion. Gallbladder and biliary tree appear normal. Pancreas: Unremarkable. No pancreatic ductal dilatation or surrounding inflammatory changes. Spleen: Normal in size without focal abnormality. Adrenals/Urinary Tract: Small cyst in the lower pole of the left kidney is noted. The kidneys otherwise appear normal. 1 cm nodule in the left adrenal gland cannot be definitively characterized but is likely a benign adenoma. The right adrenal gland appears normal. Urinary bladder is unremarkable. Stomach/Bowel: Small hiatal hernia is seen. The stomach is otherwise unremarkable. Small and large bowel appear normal. The appendix is not visualized but there is no evidence of appendicitis. Vascular/Lymphatic: Extensive aortoiliac atherosclerosis without aneurysm. No lymphadenopathy. Reproductive: Status post hysterectomy. No adnexal masses. Other: No fluid collection.  Mild body wall edema is noted. Musculoskeletal: No acute or focal bony abnormality. Multilevel lumbar spondylosis is noted. IMPRESSION: Extensive airspace disease throughout the chest could be due to multifocal pneumonia, ARDS or pulmonary edema. Marked cardiomegaly. Small mediastinal lymph nodes are presumably reactive. Small bilateral pleural effusions. Prominence of the pulmonary arteries compatible with pulmonary arterial hypertension. Extensive calcific aortic and coronary atherosclerosis. No acute abnormality abdomen or pelvis. Fatty infiltration of the liver. Electronically Signed   By: Drusilla Kannerhomas  Dalessio M.D.   On:  04/17/2017 00:57     Scheduled Meds: . apixaban  5 mg Oral BID  . digoxin  0.25 mg Intravenous Daily  . diltiazem  90 mg Oral Q8H  . divalproex  250 mg Oral TID  . famotidine  20 mg Oral Daily  . furosemide  20 mg Intravenous Daily  . LORazepam  0.5 mg Oral Q8H  . mouth rinse  15 mL Mouth Rinse BID  . memantine  10 mg Oral Daily  . metoprolol succinate  100 mg Oral Daily  . potassium chloride  20 mEq Oral Daily  . traZODone  50 mg Oral QHS   Continuous Infusions: . azithromycin    . piperacillin-tazobactam (ZOSYN)  IV Stopped (04/17/17 [id number])    Hartley BarefootBelkys Azzan Butler, MD [facility] Pager [phone]  If 7PM-7AM, please contact night-coverage [email or web address] Password [email or web address] 04/17/2017, 8:50 AM

## 2017-04-17 NOTE — Consult Note (Signed)
Requesting Physician: Dr. Sunnie Nielsen    Chief Complaint: STROKE  History obtained from:    Chart    HPI:                                                                                                                                         Sheryl Horn is an 71 y.o. female with history of advanced dementia was brought to the ER with increasing confusion and behavioral disturbance. In the ER patient was seen by behavioral health team and advised inpatient psychiatric admission and patient was accepted at [facility]. Patient was waiting for transfer when patient's heart rate increased and was found to be in A. fib with RVR. Patient has advanced dementia and does not provide any history. Patient has a known history of A. fib and is on metoprolol and Cardizem for rate control and on Apixaban. She did Meet sepsis criteria with fever, elevated heart rate, and Started on ceftriaxone and azithromycin in the emergency room.Given concern for acute changes about 2 months ago, MRI of brain was obtained to rule out any organic causes. 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   Date last known well: Unable to determine Time last known well: Unable to determine tPA Given: No: No last known normal   Modified Rankin: Rankin Score=3   Past Medical History:  Diagnosis Date  . A-fib (HCC)   . Anxiety   . Dementia   . Depression   . Hypertension     History reviewed. No pertinent surgical history.  Family History  Problem Relation Age of Onset  . Family history unknown: Yes   Social History:  reports that she has never smoked. Her smokeless tobacco use includes Snuff and Chew. She reports that she does not drink alcohol or use drugs.  Allergies:  Allergies  Allergen Reactions  . Bacitracin Other (See Comments)    Reaction unknown per MAR  . Other Other (See Comments)    HMG-COA  reductase inhibitors - reaction unknown per MAR  . Penicillins Other (See Comments)    Reaction unknown per MAR  . Statins Other (See Comments)    Reaction unknown per [facility]    Medications:  Scheduled: .  stroke: mapping our early stages of recovery book   Does not apply Once  . apixaban  5 mg Oral BID  . digoxin  0.25 mg Intravenous Daily  . diltiazem  90 mg Oral Q8H  . divalproex  250 mg Oral TID  . famotidine  20 mg Oral Daily  . furosemide  20 mg Intravenous Daily  . LORazepam  0.5 mg Oral Q8H  . mouth rinse  15 mL Mouth Rinse BID  . memantine  10 mg Oral Daily  . metoprolol succinate  100 mg Oral Daily  . potassium chloride  20 mEq Oral Daily  . traZODone  50 mg Oral QHS    ROS:                                                                                                                                       Unable to obtain due to Dementia  General Examination:                                                                                                      Blood pressure 124/90, pulse 97, temperature 98.4 F (36.9 C), temperature source Axillary, resp. rate 20, height  (1.676 m), weight 62.2 kg (137 lb 2 oz), SpO2 97 %.  HEENT-  Normocephalic, no lesions, without obvious abnormality.  Normal external eye and conjunctiva.  Normal TM's bilaterally.  Normal auditory canals and external ears. Normal external nose, mucus membranes and septum.  Normal pharynx. Cardiovascular- irregularly irregular rhythm, pulses palpable throughout   Lungs- chest clear, no wheezing, rales, normal symmetric air entry, Heart exam - S1, S2 normal, no murmur, no gallop, rate regular Abdomen- normal findings: bowel sounds normal Extremities- no edema Lymph-no adenopathy palpable Musculoskeletal-no joint tenderness, deformity or swelling Skin-warm and dry, no  hyperpigmentation, vitiligo, or suspicious lesions  Neurological Examination Mental Status: Alert, not oriented, minimal speech but no dysarthria and not able to follow verbal or visual commands due to her dementia and confusion. Exhibits one short episode of mild mood lability during exam. Cranial Nerves: II: Visual fields grossly normal,  III,IV, VI: ptosis not present, extra-ocular motions intact bilaterally, pupils equal, round, reactive to light and accommodation V,VII: face symmetric, facial light touch sensation normal bilaterally VIII: hearing normal bilaterally IX,X: Uvula rises symmetrically. No pharyngeal dysarthria.  XI: bilateral shoulder shrug XII: midline tongue extension. No lingual dysarthria. Motor: Right : Upper extremity   5/5    Left:  Upper extremity   5/5  Lower extremity   5/5     Lower extremity   5/5 Tone and bulk:normal tone throughout; no atrophy noted Sensory: Grossly intact as the patient withdraws from pain bilaterally. Unable to assess in more detail due to cognitive deficit.  Deep Tendon Reflexes: 1+ and symmetric throughout Plantars: Right: downgoing  Left: downgoing Cerebellar/Gait: Unable to assess as not following detailed commands.   Lab Results: Basic Metabolic Panel:  Recent Labs Lab 04/12/17 0327 04/13/17 0329 04/14/17 0353 04/15/17 0757 04/17/17 0600  NA 143 144 140 140 136  K 3.5 3.8 3.6 3.8 3.6  CL 110 112* 111 110 102  CO2 23 24 22 22 23   GLUCOSE 166* 105* 104* 95 94  BUN 12 7 8 10 9   CREATININE 1.06* 0.78 0.66 0.61 0.56  CALCIUM 8.4* 8.8* 8.3* 8.4* 8.8*    Liver Function Tests:  Recent Labs Lab 04/10/17 1833  AST 17  ALT 14  ALKPHOS 86  BILITOT 0.6  PROT 6.4*  ALBUMIN 3.3*   No results for input(s): LIPASE, AMYLASE in the last 168 hours.  Recent Labs Lab 04/10/17 1833  AMMONIA 38*    CBC:  Recent Labs Lab 04/10/17 1833  04/12/17 0327 04/13/17 0329 04/14/17 0353 04/15/17 0757 04/17/17 0600  WBC 6.0   --  7.9 11.7* 9.1 8.3 8.8  NEUTROABS 2.4  --   --   --   --   --   --   HGB 14.0  < > 14.3 14.8 14.5 13.2 14.2  HCT 43.1  < > 43.3 44.4 42.8 39.6 42.3  MCV 94.5  --  92.9 93.7 92.8 93.2 91.6  PLT 146*  --  138* 143* 112* 110* 150  < > = values in this interval not displayed.  Cardiac Enzymes:  Recent Labs Lab 04/12/17 0548  TROPONINI 0.06*    Lipid Panel: No results for input(s): CHOL, TRIG, HDL, CHOLHDL, VLDL, LDLCALC in the last 168 hours.  CBG:  Recent Labs Lab 04/10/17 1833  GLUCAP 84    Microbiology: Results for orders placed or performed during the hospital encounter of 04/10/17  MRSA PCR Screening     Status: None   Collection Time: 04/11/17 10:30 PM  Result Value Ref Range Status   MRSA by PCR NEGATIVE NEGATIVE Final    Comment:        The GeneXpert MRSA Assay (FDA approved for NASAL specimens only), is one component of a comprehensive MRSA colonization surveillance program. It is not intended to diagnose MRSA infection nor to guide or monitor treatment for MRSA infections.     Coagulation Studies: No results for input(s): LABPROT, INR in the last 72 hours.  Imaging: Ct Chest W Contrast  Result Date: 04/17/2017 CLINICAL DATA:  Sepsis secondary to community acquired pneumonia in a patient with dementia. Acute kidney injury. EXAM: CT CHEST, ABDOMEN, AND PELVIS WITH CONTRAST TECHNIQUE: Multidetector CT imaging of the chest, abdomen and pelvis was performed following the standard protocol during bolus administration of intravenous contrast. CONTRAST:  100 ml ISOVUE-300 IOPAMIDOL (ISOVUE-300) INJECTION 61% COMPARISON:  PA and lateral chest 04/10/2017 and 06/22/2015. FINDINGS: CT CHEST FINDINGS Cardiovascular: There is cardiomegaly. No pericardial effusion. Extensive calcific aortic and coronary atherosclerosis is identified. The pulmonary outflow trunk and right and left main pulmonary arteries are prominent suggestive of pulmonary arterial hypertension.  Mediastinum/Nodes: AP window node on image 18 measures 1.4 cm short axis dimension right precarinal node on image 20 measures 1.6 cm short axis  dimension. Finally, right hilar node on image 25 measures 1.1 cm short axis dimension. Lungs/Pleura: Small bilateral pleural effusions are identified. Extensive airspace disease throughout all lobes of both lungs is identified and appears somewhat worst in the upper lobes bilaterally. Some areas of opacities in the upper lobes have a crazy paving type appearance. Musculoskeletal: No acute abnormality. No lytic or sclerotic lesion. Multifocal spondylosis noted. CT ABDOMEN PELVIS FINDINGS Hepatobiliary: The liver is low attenuating consistent with fatty infiltration. No focal lesion. Gallbladder and biliary tree appear normal. Pancreas: Unremarkable. No pancreatic ductal dilatation or surrounding inflammatory changes. Spleen: Normal in size without focal abnormality. Adrenals/Urinary Tract: Small cyst in the lower pole of the left kidney is noted. The kidneys otherwise appear normal. 1 cm nodule in the left adrenal gland cannot be definitively characterized but is likely a benign adenoma. The right adrenal gland appears normal. Urinary bladder is unremarkable. Stomach/Bowel: Small hiatal hernia is seen. The stomach is otherwise unremarkable. Small and large bowel appear normal. The appendix is not visualized but there is no evidence of appendicitis. Vascular/Lymphatic: Extensive aortoiliac atherosclerosis without aneurysm. No lymphadenopathy. Reproductive: Status post hysterectomy. No adnexal masses. Other: No fluid collection.  Mild body wall edema is noted. Musculoskeletal: No acute or focal bony abnormality. Multilevel lumbar spondylosis is noted. IMPRESSION: Extensive airspace disease throughout the chest could be due to multifocal pneumonia, ARDS or pulmonary edema. Marked cardiomegaly. Small mediastinal lymph nodes are presumably reactive. Small bilateral pleural  effusions. Prominence of the pulmonary arteries compatible with pulmonary arterial hypertension. Extensive calcific aortic and coronary atherosclerosis. No acute abnormality abdomen or pelvis. Fatty infiltration of the liver. Electronically Signed   By: Drusilla Kanner M.D.   On: 04/17/2017 00:57   Mr Laqueta Jean ZO Contrast  Result Date: 04/16/2017 CLINICAL DATA:  Initial evaluation for a advanced dementia with increasing confusion and behavioral disturbance. EXAM: MRI HEAD WITHOUT AND WITH CONTRAST TECHNIQUE: Multiplanar, multiecho pulse sequences of the brain and surrounding structures were obtained without and with intravenous contrast. CONTRAST:  12mL MULTIHANCE GADOBENATE DIMEGLUMINE 529 MG/ML IV SOLN COMPARISON:  Comparison made with prior CT from 04/10/2017. FINDINGS: Brain: Study moderately degraded by motion artifact. Diffuse prominence of the CSF containing spaces compatible with generalized cerebral atrophy. Patchy and confluent T2/FLAIR hyperintensity within the periventricular and deep white matter both cerebral hemispheres most consistent with chronic micro vessel ischemic disease. Overall, changes are moderate nature. Patchy approximately 2 cm acute ischemic infarct within the mid left cerebellar hemisphere (series 4, image 15). No associated mass effect or hemorrhage. No other areas of acute or subacute infarction. Gray-white matter differentiation otherwise maintained. No other encephalomalacia to suggest chronic infarction. No evidence for acute or chronic intracranial hemorrhage. No mass lesion, midline shift or mass effect. Mild ventricular prominence related to global parenchymal volume loss without hydrocephalus. No extra-axial fluid collection. Major dural sinuses are grossly patent. No definite abnormal enhancement, although postcontrast sequence Ing is limited by extensive motion. Pituitary gland suprasellar region grossly within normal limits. Vascular: Major intracranial vascular flow  voids are maintained. Skull and upper cervical spine: Craniocervical junction within normal limits. Visualized upper cervical spine unremarkable. Bone marrow signal intensity within normal limits. No scalp soft tissue abnormality. Sinuses/Orbits: Globes and orbital soft tissues within normal limits. Patient status post lens extraction bilaterally. Scattered mucosal thickening within the left ethmoidal air cells and left maxillary sinus. Superimposed fluid level noted within left maxillary sinus. Paranasal sinuses otherwise clear. Trace opacity noted within the right mastoid air cells. Inner ear structures  grossly normal. Other: None. IMPRESSION: 1. 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 2. Moderate cerebral atrophy with chronic microvascular ischemic disease. 3. Acute left maxillary and ethmoidal sinusitis. Electronically Signed   By: Rise Mu M.D.   On: 04/16/2017 19:21   Ct Abdomen Pelvis W Contrast  Result Date: 04/17/2017 CLINICAL DATA:  Sepsis secondary to community acquired pneumonia in a patient with dementia. Acute kidney injury. EXAM: CT CHEST, ABDOMEN, AND PELVIS WITH CONTRAST TECHNIQUE: Multidetector CT imaging of the chest, abdomen and pelvis was performed following the standard protocol during bolus administration of intravenous contrast. CONTRAST:  100 ml ISOVUE-300 IOPAMIDOL (ISOVUE-300) INJECTION 61% COMPARISON:  PA and lateral chest 04/10/2017 and 06/22/2015. FINDINGS: CT CHEST FINDINGS Cardiovascular: There is cardiomegaly. No pericardial effusion. Extensive calcific aortic and coronary atherosclerosis is identified. The pulmonary outflow trunk and right and left main pulmonary arteries are prominent suggestive of pulmonary arterial hypertension. Mediastinum/Nodes: AP window node on image 18 measures 1.4 cm short axis dimension right precarinal node on image 20 measures 1.6 cm short axis dimension. Finally, right hilar node on image 25 measures 1.1  cm short axis dimension. Lungs/Pleura: Small bilateral pleural effusions are identified. Extensive airspace disease throughout all lobes of both lungs is identified and appears somewhat worst in the upper lobes bilaterally. Some areas of opacities in the upper lobes have a crazy paving type appearance. Musculoskeletal: No acute abnormality. No lytic or sclerotic lesion. Multifocal spondylosis noted. CT ABDOMEN PELVIS FINDINGS Hepatobiliary: The liver is low attenuating consistent with fatty infiltration. No focal lesion. Gallbladder and biliary tree appear normal. Pancreas: Unremarkable. No pancreatic ductal dilatation or surrounding inflammatory changes. Spleen: Normal in size without focal abnormality. Adrenals/Urinary Tract: Small cyst in the lower pole of the left kidney is noted. The kidneys otherwise appear normal. 1 cm nodule in the left adrenal gland cannot be definitively characterized but is likely a benign adenoma. The right adrenal gland appears normal. Urinary bladder is unremarkable. Stomach/Bowel: Small hiatal hernia is seen. The stomach is otherwise unremarkable. Small and large bowel appear normal. The appendix is not visualized but there is no evidence of appendicitis. Vascular/Lymphatic: Extensive aortoiliac atherosclerosis without aneurysm. No lymphadenopathy. Reproductive: Status post hysterectomy. No adnexal masses. Other: No fluid collection.  Mild body wall edema is noted. Musculoskeletal: No acute or focal bony abnormality. Multilevel lumbar spondylosis is noted. IMPRESSION: Extensive airspace disease throughout the chest could be due to multifocal pneumonia, ARDS or pulmonary edema. Marked cardiomegaly. Small mediastinal lymph nodes are presumably reactive. Small bilateral pleural effusions. Prominence of the pulmonary arteries compatible with pulmonary arterial hypertension. Extensive calcific aortic and coronary atherosclerosis. No acute abnormality abdomen or pelvis. Fatty infiltration  of the liver. Electronically Signed   By: Drusilla Kanner M.D.   On: 04/17/2017 00:57    Vascular Ultrasound Carotid Duplex (Doppler) has been completed.  Preliminary findings: Bilateral: No significant (1-39%) ICA stenosis. Antegrade vertebral flow.   LDL and A1c pending  Assessment and plan discussed with with attending physician and they are in agreement.   Felicie Morn PA-C [facility] [phone] 04/17/2017, 10:32 AM   Assessment: 71 y.o. female brought to the ER with increased confusion and behavioral disturbances.  1. The patient at baseline has significant dementia and currently being treated for PNA.  2. She was found to be in atrial fibrillation and was started on Eliquis. 3. MRI was obtained concerning that stroke may be the cause of some of the behavioral abnormalities. MRI did show a 2 cm cerebellar  infarct which would not be etiologically related to her behavioral abnormalities.  4. Stroke Risk Factors - atrial fibrillation and hypertension  Recommend 1. HgbA1c, fasting lipid panel 2. MRA of the brain without contrast. Carotid ultrasound. 3. PT consult, OT consult, Speech consult 4. Echocardiogram 5. Has statin allergy 6. Prophylactic therapy-Continue Apixaban 7. Risk factor modification 8. Telemetry monitoring 9. Frequent neuro checks 10 NPO until passes stroke swallow screen 11. BP management. 12. Consider a trial of Aricept and observe for possible improvement in cognition.  13. Continue memantine. 14 please page stroke NP  Or  PA  Or MD from 8am -4 pm  as this patient from this time will be  followed by the stroke.   You can look them up on [email or web address]  Password [email or web address]   Electronically signed: Dr. Caryl Pina

## 2017-04-17 NOTE — Progress Notes (Signed)
*  PRELIMINARY RESULTS* Vascular Ultrasound Carotid Duplex (Doppler) has been completed.  Preliminary findings: Bilateral: No significant (1-39%) ICA stenosis. Antegrade vertebral flow.    Farrel DemarkJill Eunice, RDMS, RVT  04/17/2017, 10:21 AM

## 2017-04-17 NOTE — Progress Notes (Signed)
Nutrition Follow-up  DOCUMENTATION CODES:   Not applicable  INTERVENTION:  - Continue Magic Cup TID, each supplement provides 290 kcal and 9 grams of protein. - Continue to encourage PO intakes of meals and supplement.  - RD will monitor for additional nutrition-related needs.  NUTRITION DIAGNOSIS:   Inadequate oral intake related to lethargy/confusion (and agitation) as evidenced by  (per sitter's report). -ongiong  GOAL:   Patient will meet greater than or equal to 90% of their needs -unmet on average   MONITOR:   PO intake, Supplement acceptance, Weight trends, Labs  ASSESSMENT:   71 y.o. female with history of advanced dementia was brought to the ER with increasing confusion and behavioral disturbance. In the ER patient was seen by behavioral health team and advised inpatient psychiatric admission and [facility] Patient was waiting for transfer when patient's heart rate increased and was found to be in A. fib with RVR. Patient has advanced dementia  9/12 Per chart review, pt consumed 75% of breakfast (545 kcal and 22 grams of protein) and 50% of lunch (650 kcal and 29 grams of protein) on 9/9; she consumed 30% of breakfast (220 kcal and 9 grams of protein), 25% of lunch (255 kcal and 12 grams of protein), and 15% of dinner (132 kcal and 7 grams of protein) on 9/10; for breakfast this AM she ate 25% (185 kcal and 7 grams of protein). No new weight since 9/7. Pt with acute L cerebellar stroke. Neurology has been consulted and [facility] Pt with advanced dementia at baseline. She was made NPO after breakfast this AM.   Medications reviewed; 20 mg oral Pepcid/day, 20 mg IV Lasix/day, 20 mEq oral KCl/day. Labs reviewed.   9/7 - Pt is disoriented x4 with no family/visitors at bedside.  - Pt has a sitter, who provides all information.  - Pt has dentures and pt able to tolerate soft foods.  - She did not consume breakfast  9/6 d/t agitation, ate well for lunch, and flow sheet shows that she ate carrots and a cup of ice cream for dinner last night.  - Sitter had ordered breakfast shortly before RD visit (applesauce, oatmeal, and scrambled eggs).  - Sitter stated that she felt pt would do well with [employer].  Physical assessment shows no muscle and no fat wasting; unable to assess back or hands (due to bilateral mitten restraints). No weight hx available in the chart since November 2016.  IVF: NS @ 125 mL/hr.     Diet Order:  Diet NPO time specified  Skin:  Reviewed, no issues  Last BM:  9/12  Height:   Ht Readings from Last 1 Encounters:  04/12/17 5\' 6"  (1.676 m)    Weight:   Wt Readings from Last 1 Encounters:  04/12/17 137 lb 2 oz (62.2 kg)    Ideal Body Weight:  59.09 kg  BMI:  Body mass index is 22.13 kg/m.  Estimated Nutritional Needs:   Kcal:  1244-1495 (20-24 kcal/kg)  Protein:  50-62 grams (0.8-1 grams/kg)  Fluid:  >/= 1.4 L/day  EDUCATION NEEDS:   No education needs identified at this time    Trenton GammonJessica Aodhan Scheidt, MS, RD, LDN, CNSC Inpatient Clinical Dietitian Pager # [phone] After hours/weekend pager # [phone]

## 2017-04-17 NOTE — Evaluation (Addendum)
Clinical/Bedside Swallow Evaluation Patient Details  Name: Sheryl Horn MRN: [id number] Date of Birth: 04/11/1946  Today's Date: 04/17/2017 Time: SLP Start Time (ACUTE ONLY): 1350 SLP Stop Time (ACUTE ONLY): 1410 SLP Time Calculation (min) (ACUTE ONLY): 20 min  Past Medical History:  Past Medical History:  Diagnosis Date  . A-fib (HCC)   . Anxiety   . Dementia   . Depression   . Hypertension    Past Surgical History: History reviewed. No pertinent surgical history.   HPI:  71 year old female admitted 04/10/17 with AMS. PMH significant for advanced dementia, AFib, anxiety, depression, HTN. No family present during this assessment.   Assessment / Plan / Recommendation Clinical Impression  Upon arrival of SLP, pt was noted to be awake and alert, but non verbal. Pt unable to follow directions for assessment of oral motor strength and function. Missing dentition noted. Pt presents with immediate cough response following thin liquids, raising concern for airway compromise. She was unable to masticate a saltine cracker, requiring removal of the entire bolus by SLP. Pt appears to tolerate nectar thick liquids and puree consistencies without difficulty or delay. Recommend Dys 1 diet (puree) with nectar thick liquids, crushed meds. [facility] swallow [facility] to follow for diet tolerance assessment and education. RN informed.  Orders were also received for completion of cog/com evaluation. Pt is currently unable to participate in this evaluation, as she is non-vocal and does not follow commands, due to advanced dementia. RN aware.  SLP Visit Diagnosis: Dysphagia, unspecified (R13.10)    Aspiration Risk  Moderate aspiration risk    Diet Recommendation Dysphagia 1 (Puree);Nectar-thick liquid   Liquid Administration via: Straw Medication Administration: Crushed with puree Supervision: Full supervision/cueing for compensatory strategies;Staff to assist with self  feeding Compensations: Minimize environmental distractions;Slow rate;Small sips/bites Postural Changes: Seated upright at 90 degrees    Other  Recommendations Oral Care Recommendations: Oral care QID Other Recommendations: Order thickener from pharmacy   Follow up Recommendations 24 hour supervision/assistance      Frequency and Duration min 2x/week  2 weeks       Prognosis Prognosis for Safe Diet Advancement: Fair-Guarded Barriers to Reach Goals: Cognitive deficits      Swallow Study   General Date of Onset: 04/10/17 HPI: 71 year old female admitted 04/10/17 with AMS. PMH significant for advanced dementia Type of Study: Bedside Swallow Evaluation Previous Swallow Assessment: none found Diet Prior to this Study: NPO Temperature Spikes Noted: No Respiratory Status: Nasal cannula History of Recent Intubation: No Behavior/Cognition: Alert;Confused;Requires cueing;Doesn't follow directions Oral Cavity Assessment: Within Functional Limits Oral Care Completed by SLP: No Oral Cavity - Dentition: Poor condition;Missing dentition Self-Feeding Abilities: Total assist Patient Positioning: Upright in bed Baseline Vocal Quality:  (no vocalizations elicited) Volitional Cough: Cognitively unable to elicit Volitional Swallow: Unable to elicit    Oral/Motor/Sensory Function Overall Oral Motor/Sensory Function:  (unable to assess)   Ice Chips Ice chips: Not tested   Thin Liquid Thin Liquid: Impaired Presentation: Straw Pharyngeal  Phase Impairments: Cough - Immediate    Nectar Thick Nectar Thick Liquid: Within functional limits Presentation: Straw   Honey Thick Honey Thick Liquid: Not tested   Puree Puree: Within functional limits Presentation: Spoon   Solid   GO   Solid: Impaired Oral Phase Impairments: Poor awareness of bolus;Impaired mastication Oral Phase Functional Implications: Oral holding       Sohan Potvin B. Murvin NatalBueche, [facility] Surgery CenterMSP, CCC-SLP Speech Language Pathologist  Leigh AuroraBueche, Makhi Muzquiz  Brown 04/17/2017,2:22 PM

## 2017-04-17 NOTE — Progress Notes (Signed)
  Echocardiogram 2D Echocardiogram has been performed.  Arvil ChacoFoster, Caitrin Pendergraph 04/17/2017, 10:59 AM

## 2017-04-17 NOTE — Care Management Important Message (Signed)
Important Message  Patient Details  Name: Sheryl Horn Massie MRN: [id number] Date of Birth: 04/29/1946   Medicare Important Message Given:  Yes    Caren MacadamFuller, Kassidy Frankson 04/17/2017, 11:28 AMImportant Message  Patient Details  Name: Sheryl Horn Peri MRN: [id number] Date of Birth: 04/04/1946   Medicare Important Message Given:  Yes    Caren MacadamFuller, Kaio Kuhlman 04/17/2017, 11:28 AM

## 2017-04-17 NOTE — Progress Notes (Deleted)
[facility] Facility aware of patient's discharge and confirmed patient's ability to return. PTAR contacted, family notified. Patient's RN can call report to [phone], packet complete.  Celso SickleKimberly Emmette Katt, ConnecticutLCSWA Clinical Social Worker [facility] Cell#: [phone]

## 2017-04-17 NOTE — Progress Notes (Signed)
Spoke to Roy,pt's brother w/ update.

## 2017-04-18 ENCOUNTER — Inpatient Hospital Stay (HOSPITAL_COMMUNITY): Payer: Medicare Other

## 2017-04-18 LAB — VAS US CAROTID
LCCADDIAS: -8 cm/s
LCCADSYS: -50 cm/s
LCCAPSYS: 86 cm/s
LEFT ECA DIAS: -7 cm/s
LEFT VERTEBRAL DIAS: -5 cm/s
LICADSYS: -56 cm/s
Left CCA prox dias: 8 cm/s
Left ICA dist dias: -7 cm/s
Left ICA prox dias: -3 cm/s
Left ICA prox sys: -41 cm/s
RCCADSYS: -42 cm/s
RCCAPDIAS: 8 cm/s
RCCAPSYS: 64 cm/s
RIGHT ECA DIAS: -1 cm/s
RIGHT VERTEBRAL DIAS: -8 cm/s

## 2017-04-18 LAB — HEMOGLOBIN A1C
HEMOGLOBIN A1C: 5.3 % (ref 4.8–5.6)
Mean Plasma Glucose: 105.41 mg/dL

## 2017-04-18 LAB — LIPID PANEL
Cholesterol: 157 mg/dL (ref 0–200)
HDL: 27 mg/dL — ABNORMAL LOW (ref >40)
LDL CALC: 110 mg/dL — AB (ref 0–99)
TRIGLYCERIDES: 98 mg/dL (ref <150)
Total CHOL/HDL Ratio: 5.8 RATIO
VLDL: 20 mg/dL (ref 0–40)

## 2017-04-18 MED ORDER — INFLUENZA VAC SPLIT HIGH-DOSE 0.5 ML IM SUSY
0.5000 mL | PREFILLED_SYRINGE | INTRAMUSCULAR | Status: AC
  Administered 2017-04-22: 0.5 mL via INTRAMUSCULAR
  Filled 2017-04-18 (×2): qty 0.5

## 2017-04-18 MED ORDER — EZETIMIBE 10 MG PO TABS
10.0000 mg | ORAL_TABLET | Freq: Every day | ORAL | Status: DC
  Administered 2017-04-18 – 2017-04-22 (×5): 10 mg via ORAL
  Filled 2017-04-18 (×5): qty 1

## 2017-04-18 MED ORDER — FUROSEMIDE 20 MG PO TABS
20.0000 mg | ORAL_TABLET | Freq: Every day | ORAL | Status: DC
  Administered 2017-04-19 – 2017-04-22 (×4): 20 mg via ORAL
  Filled 2017-04-18 (×4): qty 1

## 2017-04-18 MED ORDER — ENSURE ENLIVE PO LIQD
237.0000 mL | Freq: Two times a day (BID) | ORAL | Status: DC
  Administered 2017-04-18 – 2017-04-19 (×3): 237 mL via ORAL

## 2017-04-18 NOTE — Progress Notes (Signed)
PROGRESS NOTE  Sheryl Horn ZOX:[id number] DOB: [birth year] DOA: 04/10/2017 PCP: System, Provider Not In   LOS: 6 days   Brief Narrative / Interim history: Sheryl Horn is a 71 y.o. female with history of advanced dementia was brought to the ER with increasing confusion and behavioral disturbance. In the ER patient was seen by behavioral health team and advised inpatient psychiatric admission and patient was accepted at [facility]. Patient was waiting for transfer when patient's heart rate increased and was found to be in A. fib with RVR. Patient has advanced dementia and does not provide any history. Patient has a known history of A. fib and is on metoprolol and Cardizem for rate control and on Apixaban.  Clinically she improved, she was transitioned off of Cardizem infusion back on her oral regimen and was transferred to the floor on 9/11.  Assessment & Plan: Active Problems:   HTN (hypertension)   Dementia with behavioral disturbance   Atrial fibrillation with rapid ventricular response (HCC)   Thoracic aorta atherosclerosis (HCC)  Acute left cerebellar stoke;  PT, OT, Speech evaluation.  Patient will remain at [facility].  MRA pending.  Will start Zetia, patient with allergy to statin Already on eliquis Carotid doppler negative.   Acute Hypoxic Respiratory Failure;  Related to PNA.  Chest x ray with PNA vs pulmonary edema.  Having fevers. Antibiotics change to zosyn and azithromycin.  Change IV lasix to oral.  Respiratory status stable.    A. fib with RVR -Patient was initiated on Cardizem infusion with improvement in her rates.  She was converted to p.o. metoprolol as well as carvedilol 9/6, however overnight towards 9/7 her rates have gone back up into the 150s.  She was started back again on Cardizem IV and digoxin, and transitioned back to p.o. 9/10 -Consulted cardiology, appreciate input, signed off from 9/10 -Continue anticoagulation with apixaban -Rate  controlled. Continue with metoprolol and Cardizem.   Sepsis due to pneumonia -Meet sepsis criteria with fever, elevated heart rate, and a source -Started on ceftriaxone and azithromycin in the emergency room. Received 3 days.  -continue to spike fever, ct chest showed multifocal pneumonia.  -Started IV zosyn 9-12, restarted azithromycin 9-2. plan to discharge patient on Augmentin  -blood culture no growth to date.    Acute kidney injury -Likely due to dehydration, elevated heart rates as well as sepsis -Creatinine has now normalized  Dementia with behavioral disturbances Per  patient's brother [name],  patient has been diagnosed with Alzheimer's dementia 4-5 years ago, however he feels like her dementia was stable up until 2 months ago when she had an abrupt decline in her mental status, she became more aggressive and intermittently nonverbal / catatonic.  He feels like these changes are abrupt and he believes that they are medication related despite the fact that patient has been on the same regimen for few years.  She had an episode when she was hospitalized at [facility] few months ago and she was discharged off of her Depakote, and feels like her behavior changed then, however Depakote was later added back on in her memory care unit.  I called and discussed with the nurse at [facility] home and she felt like her recent changes are not that abrupt and in fact were somewhat of a gradual progression. -Given concern for acute changes about 2 months ago, an MRI of the brain was obtain  to rule out any organic causes. MRI positive for stroke.  -Psych consulted  also.  Patient will be discharge on Depakote. Which could be increase as needed for mood.  Continue with ativan.  namenda dose reduce to once a day.  Started low dose Aricept.    Hypertension -Blood pressure improving and now on the hypertensive side  Mild thrombocytopenia -Stable, follow CBC, platelets 110, slightly  worse likely in the setting of sepsis -Repeat CBC in the morning    DVT prophylaxis: Apixaban Code Status: Full code Family Communication: No family at bedside, try to contact brother twice today,  Disposition Plan: SNF in 24 hours.  Consultants:   Cardiology  Procedures:   None   Antimicrobials:  Ceftriaxone 9/6 >>9-12  Azithromycin 9/6 >>  Subjective: Patient answer only yes, she had cognition problems. She is confuse.  Denies dyspnea.  She has been calm.   Objective: Vitals:   04/17/17 1810 04/17/17 2020 04/18/17 0035 04/18/17 0528  BP: 137/72 (!) 178/83 (!) 164/67 136/77  Pulse: 90 87 89 98  Resp: 20 20 20 18   Temp: 98.5 F (36.9 C) 99.5 F (37.5 C) 98.7 F (37.1 C) 99.6 F (37.6 C)  TempSrc: Oral Oral Oral Axillary  SpO2: 98% 99% 93% 92%  Weight:      Height:        Intake/Output Summary (Last 24 hours) at 04/18/17 1407 Last data filed at 04/18/17 1016  Gross per 24 hour  Intake              790 ml  Output              500 ml  Net              290 ml   Filed Weights   04/11/17 1913 04/12/17 0701  Weight: 62.2 kg (137 lb 3.2 oz) 62.2 kg (137 lb 2 oz)    Examination:  Vitals:   04/17/17 1810 04/17/17 2020 04/18/17 0035 04/18/17 0528  BP: 137/72 (!) 178/83 (!) 164/67 136/77  Pulse: 90 87 89 98  Resp: 20 20 20 18   Temp: 98.5 F (36.9 C) 99.5 F (37.5 C) 98.7 F (37.1 C) 99.6 F (37.6 C)  TempSrc: Oral Oral Oral Axillary  SpO2: 98% 99% 93% 92%  Weight:      Height:       Constitutional: NAD Eyes: normal conjunctivae,.  ENMT: MMM Moist  Respiratory: CTA Cardiovascular:IRR Abdomen: BS present, soft, nt Neurologic: moves extremities passively, does not follows command.    Data Reviewed: I have independently reviewed following labs and imaging studies   CBC:  Recent Labs Lab 04/12/17 0327 04/13/17 0329 04/14/17 0353 04/15/17 0757 04/17/17 0600  WBC 7.9 11.7* 9.1 8.3 8.8  HGB 14.3 14.8 14.5 13.2 14.2  HCT 43.3 44.4 42.8 39.6  42.3  MCV 92.9 93.7 92.8 93.2 91.6  PLT 138* 143* 112* 110* 150   Basic Metabolic Panel:  Recent Labs Lab 04/12/17 0327 04/13/17 0329 04/14/17 0353 04/15/17 0757 04/17/17 0600  NA 143 144 140 140 136  K 3.5 3.8 3.6 3.8 3.6  CL 110 112* 111 110 102  CO2 23 24 22 22 23   GLUCOSE 166* 105* 104* 95 94  BUN 12 7 8 10 9   CREATININE 1.06* 0.78 0.66 0.61 0.56  CALCIUM 8.4* 8.8* 8.3* 8.4* 8.8*   GFR: Estimated Creatinine Clearance: 58.6 mL/min (by C-G formula based on SCr of 0.56 mg/dL). Liver Function Tests: No results for input(s): AST, ALT, ALKPHOS, BILITOT, PROT, ALBUMIN in the last 168 hours. No results for input(s): LIPASE,  AMYLASE in the last 168 hours. No results for input(s): AMMONIA in the last 168 hours. Coagulation Profile: No results for input(s): INR, PROTIME in the last 168 hours. Cardiac Enzymes:  Recent Labs Lab 04/12/17 0548  TROPONINI 0.06*   BNP (last 3 results) No results for input(s): PROBNP in the last 8760 hours. HbA1C:  Recent Labs  04/18/17 0504  HGBA1C 5.3   CBG: No results for input(s): GLUCAP in the last 168 hours. Lipid Profile:  Recent Labs  04/18/17 0504  CHOL 157  HDL 27*  LDLCALC 110*  TRIG 98  CHOLHDL 5.8   Thyroid Function Tests: No results for input(s): TSH, T4TOTAL, FREET4, T3FREE, THYROIDAB in the last 72 hours. Anemia Panel: No results for input(s): VITAMINB12, FOLATE, FERRITIN, TIBC, IRON, RETICCTPCT in the last 72 hours. Urine analysis:    Component Value Date/Time   COLORURINE YELLOW 04/10/2017 1858   APPEARANCEUR CLEAR 04/10/2017 1858   LABSPEC 1.019 04/10/2017 1858   PHURINE 6.0 04/10/2017 1858   GLUCOSEU NEGATIVE 04/10/2017 1858   HGBUR NEGATIVE 04/10/2017 1858   BILIRUBINUR NEGATIVE 04/10/2017 1858   KETONESUR 5 (A) 04/10/2017 1858   PROTEINUR NEGATIVE 04/10/2017 1858   UROBILINOGEN 2.0 (H) 06/10/2015 1724   NITRITE NEGATIVE 04/10/2017 1858   LEUKOCYTESUR TRACE (A) 04/10/2017 1858   Sepsis  Labs: Invalid input(s): PROCALCITONIN, LACTICIDVEN  Recent Results (from the past 240 hour(s))  MRSA PCR Screening     Status: None   Collection Time: 04/11/17 10:30 PM  Result Value Ref Range Status   MRSA by PCR NEGATIVE NEGATIVE Final    Comment:        The GeneXpert MRSA Assay (FDA approved for NASAL specimens only), is one component of a comprehensive MRSA colonization surveillance program. It is not intended to diagnose MRSA infection nor to guide or monitor treatment for MRSA infections.   Culture, blood (routine x 2)     Status: None (Preliminary result)   Collection Time: 04/17/17 10:46 AM  Result Value Ref Range Status   Specimen Description BLOOD RIGHT ARM  Final   Special Requests   Final    BOTTLES DRAWN AEROBIC ONLY Blood Culture results may not be optimal due to an inadequate volume of blood received in culture bottles   Culture   Final    NO GROWTH < 24 HOURS Performed at [facility], [address] [place], [place] [address]    Report Status PENDING  Incomplete  Culture, blood (routine x 2)     Status: None (Preliminary result)   Collection Time: 04/17/17 11:01 AM  Result Value Ref Range Status   Specimen Description BLOOD RIGHT ANTECUBITAL  Final   Special Requests IN PEDIATRIC BOTTLE Blood Culture adequate volume  Final   Culture   Final    NO GROWTH < 24 HOURS Performed at [facility], [address] [place], [place] [address]    Report Status PENDING  Incomplete      Radiology Studies: Ct Chest W Contrast  Result Date: 04/17/2017 CLINICAL DATA:  Sepsis secondary to community acquired pneumonia in a patient with dementia. Acute kidney injury. EXAM: CT CHEST, ABDOMEN, AND PELVIS WITH CONTRAST TECHNIQUE: Multidetector CT imaging of the chest, abdomen and pelvis was performed following the standard protocol during bolus administration of intravenous contrast. CONTRAST:  100 ml ISOVUE-300 IOPAMIDOL (ISOVUE-300) INJECTION 61% COMPARISON:   PA and lateral chest 04/10/2017 and 06/22/2015. FINDINGS: CT CHEST FINDINGS Cardiovascular: There is cardiomegaly. No pericardial effusion. Extensive calcific aortic  and coronary atherosclerosis is identified. The pulmonary outflow trunk and right and left main pulmonary arteries are prominent suggestive of pulmonary arterial hypertension. Mediastinum/Nodes: AP window node on image 18 measures 1.4 cm short axis dimension right precarinal node on image 20 measures 1.6 cm short axis dimension. Finally, right hilar node on image 25 measures 1.1 cm short axis dimension. Lungs/Pleura: Small bilateral pleural effusions are identified. Extensive airspace disease throughout all lobes of both lungs is identified and appears somewhat worst in the upper lobes bilaterally. Some areas of opacities in the upper lobes have a crazy paving type appearance. Musculoskeletal: No acute abnormality. No lytic or sclerotic lesion. Multifocal spondylosis noted. CT ABDOMEN PELVIS FINDINGS Hepatobiliary: The liver is low attenuating consistent with fatty infiltration. No focal lesion. Gallbladder and biliary tree appear normal. Pancreas: Unremarkable. No pancreatic ductal dilatation or surrounding inflammatory changes. Spleen: Normal in size without focal abnormality. Adrenals/Urinary Tract: Small cyst in the lower pole of the left kidney is noted. The kidneys otherwise appear normal. 1 cm nodule in the left adrenal gland cannot be definitively characterized but is likely a benign adenoma. The right adrenal gland appears normal. Urinary bladder is unremarkable. Stomach/Bowel: Small hiatal hernia is seen. The stomach is otherwise unremarkable. Small and large bowel appear normal. The appendix is not visualized but there is no evidence of appendicitis. Vascular/Lymphatic: Extensive aortoiliac atherosclerosis without aneurysm. No lymphadenopathy. Reproductive: Status post hysterectomy. No adnexal masses. Other: No fluid collection.  Mild body  wall edema is noted. Musculoskeletal: No acute or focal bony abnormality. Multilevel lumbar spondylosis is noted. IMPRESSION: Extensive airspace disease throughout the chest could be due to multifocal pneumonia, ARDS or pulmonary edema. Marked cardiomegaly. Small mediastinal lymph nodes are presumably reactive. Small bilateral pleural effusions. Prominence of the pulmonary arteries compatible with pulmonary arterial hypertension. Extensive calcific aortic and coronary atherosclerosis. No acute abnormality abdomen or pelvis. Fatty infiltration of the liver. Electronically Signed   By: Drusilla Kanner M.D.   On: 04/17/2017 00:57   Mr Laqueta Jean ZO Contrast  Result Date: 04/16/2017 CLINICAL DATA:  Initial evaluation for a advanced dementia with increasing confusion and behavioral disturbance. EXAM: MRI HEAD WITHOUT AND WITH CONTRAST TECHNIQUE: Multiplanar, multiecho pulse sequences of the brain and surrounding structures were obtained without and with intravenous contrast. CONTRAST:  12mL MULTIHANCE GADOBENATE DIMEGLUMINE 529 MG/ML IV SOLN COMPARISON:  Comparison made with prior CT from 04/10/2017. FINDINGS: Brain: Study moderately degraded by motion artifact. Diffuse prominence of the CSF containing spaces compatible with generalized cerebral atrophy. Patchy and confluent T2/FLAIR hyperintensity within the periventricular and deep white matter both cerebral hemispheres most consistent with chronic micro vessel ischemic disease. Overall, changes are moderate nature. Patchy approximately 2 cm acute ischemic infarct within the mid left cerebellar hemisphere (series 4, image 15). No associated mass effect or hemorrhage. No other areas of acute or subacute infarction. Gray-white matter differentiation otherwise maintained. No other encephalomalacia to suggest chronic infarction. No evidence for acute or chronic intracranial hemorrhage. No mass lesion, midline shift or mass effect. Mild ventricular prominence related to  global parenchymal volume loss without hydrocephalus. No extra-axial fluid collection. Major dural sinuses are grossly patent. No definite abnormal enhancement, although postcontrast sequence Ing is limited by extensive motion. Pituitary gland suprasellar region grossly within normal limits. Vascular: Major intracranial vascular flow voids are maintained. Skull and upper cervical spine: Craniocervical junction within normal limits. Visualized upper cervical spine unremarkable. Bone marrow signal intensity within normal limits. No scalp soft tissue abnormality. Sinuses/Orbits: Globes and orbital soft  tissues within normal limits. Patient status post lens extraction bilaterally. Scattered mucosal thickening within the left ethmoidal air cells and left maxillary sinus. Superimposed fluid level noted within left maxillary sinus. Paranasal sinuses otherwise clear. Trace opacity noted within the right mastoid air cells. Inner ear structures grossly normal. Other: None. IMPRESSION: 1. 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 2. Moderate cerebral atrophy with chronic microvascular ischemic disease. 3. Acute left maxillary and ethmoidal sinusitis. Electronically Signed   By: Rise MuBenjamin  McClintock M.D.   On: 04/16/2017 19:21   Ct Abdomen Pelvis W Contrast  Result Date: 04/17/2017 CLINICAL DATA:  Sepsis secondary to community acquired pneumonia in a patient with dementia. Acute kidney injury. EXAM: CT CHEST, ABDOMEN, AND PELVIS WITH CONTRAST TECHNIQUE: Multidetector CT imaging of the chest, abdomen and pelvis was performed following the standard protocol during bolus administration of intravenous contrast. CONTRAST:  100 ml ISOVUE-300 IOPAMIDOL (ISOVUE-300) INJECTION 61% COMPARISON:  PA and lateral chest 04/10/2017 and 06/22/2015. FINDINGS: CT CHEST FINDINGS Cardiovascular: There is cardiomegaly. No pericardial effusion. Extensive calcific aortic and coronary atherosclerosis is identified. The  pulmonary outflow trunk and right and left main pulmonary arteries are prominent suggestive of pulmonary arterial hypertension. Mediastinum/Nodes: AP window node on image 18 measures 1.4 cm short axis dimension right precarinal node on image 20 measures 1.6 cm short axis dimension. Finally, right hilar node on image 25 measures 1.1 cm short axis dimension. Lungs/Pleura: Small bilateral pleural effusions are identified. Extensive airspace disease throughout all lobes of both lungs is identified and appears somewhat worst in the upper lobes bilaterally. Some areas of opacities in the upper lobes have a crazy paving type appearance. Musculoskeletal: No acute abnormality. No lytic or sclerotic lesion. Multifocal spondylosis noted. CT ABDOMEN PELVIS FINDINGS Hepatobiliary: The liver is low attenuating consistent with fatty infiltration. No focal lesion. Gallbladder and biliary tree appear normal. Pancreas: Unremarkable. No pancreatic ductal dilatation or surrounding inflammatory changes. Spleen: Normal in size without focal abnormality. Adrenals/Urinary Tract: Small cyst in the lower pole of the left kidney is noted. The kidneys otherwise appear normal. 1 cm nodule in the left adrenal gland cannot be definitively characterized but is likely a benign adenoma. The right adrenal gland appears normal. Urinary bladder is unremarkable. Stomach/Bowel: Small hiatal hernia is seen. The stomach is otherwise unremarkable. Small and large bowel appear normal. The appendix is not visualized but there is no evidence of appendicitis. Vascular/Lymphatic: Extensive aortoiliac atherosclerosis without aneurysm. No lymphadenopathy. Reproductive: Status post hysterectomy. No adnexal masses. Other: No fluid collection.  Mild body wall edema is noted. Musculoskeletal: No acute or focal bony abnormality. Multilevel lumbar spondylosis is noted. IMPRESSION: Extensive airspace disease throughout the chest could be due to multifocal pneumonia, ARDS  or pulmonary edema. Marked cardiomegaly. Small mediastinal lymph nodes are presumably reactive. Small bilateral pleural effusions. Prominence of the pulmonary arteries compatible with pulmonary arterial hypertension. Extensive calcific aortic and coronary atherosclerosis. No acute abnormality abdomen or pelvis. Fatty infiltration of the liver. Electronically Signed   By: Drusilla Kannerhomas  Dalessio M.D.   On: 04/17/2017 00:57     Scheduled Meds: . apixaban  5 mg Oral BID  . digoxin  0.25 mg Intravenous Daily  . diltiazem  90 mg Oral Q8H  . divalproex  250 mg Oral TID  . donepezil  5 mg Oral QHS  . famotidine  20 mg Oral Daily  . feeding supplement (ENSURE ENLIVE)  237 mL Oral BID BM  . [START ON 04/19/2017] furosemide  20 mg Oral Daily  . [  START ON 04/19/2017] Influenza vac split quadrivalent PF  0.5 mL Intramuscular Tomorrow-1000  . LORazepam  0.5 mg Oral Q8H  . mouth rinse  15 mL Mouth Rinse BID  . memantine  10 mg Oral Daily  . metoprolol succinate  100 mg Oral Daily  . potassium chloride  20 mEq Oral Daily  . RESOURCE THICKENUP CLEAR  1 g Oral TID WC & HS  . traZODone  50 mg Oral QHS   Continuous Infusions: . azithromycin Stopped (04/18/17 1116)  . piperacillin-tazobactam (ZOSYN)  IV 3.375 g (04/18/17 1203)    Hartley Barefoot, MD [facility] Pager [phone]  If 7PM-7AM, please contact night-coverage [email or web address] Password [email or web address] 04/18/2017, 2:07 PM

## 2017-04-18 NOTE — Progress Notes (Signed)
  Speech Language Pathology Treatment: Dysphagia  Patient Details Name: Sheryl Horn MRN: [id number] DOB: 05/20/1946 Today's Date: 04/18/2017 Time: [id number] SLP Time Calculation (min) (ACUTE ONLY): 10 min  Assessment / Plan / Recommendation Clinical Impression  Pt seen to assess po tolerance, pt/family education and indication for dietary modulation.  Pt willing to accept po intake but required assistance - hand over hand with spoon to self feed.  She was able to hold her own cup and drink via straw - improving automaticity.  If SLP placed straw in mouth, pt did not form suction despite max cues.     Pt with subtle cough with liquid intake, suspect premature spillage into airway.  Recommend pt continue puree/nectar diet and follow aspiration precautions with assist to self feed to encourage intake.  Question if family has adhesive for pt's ill fitting upper denture?  Will follow up for family education.    HPI HPI: 71 year old female admitted 04/10/17 with AMS. PMH significant for advanced dementia.  Swallow evaluation ordered.      SLP Plan  Continue with current plan of care       Recommendations  Diet recommendations: Dysphagia 1 (puree);Nectar-thick liquid Liquids provided via: Cup;Straw Medication Administration: Crushed with puree Supervision: Patient able to self feed Compensations: Slow rate;Small sips/bites (check for oral residual) Postural Changes and/or Swallow Maneuvers: Seated upright 90 degrees;Upright 30-60 min after meal                Oral Care Recommendations: Oral care BID Follow up Recommendations: None SLP Visit Diagnosis: Dysphagia, oral phase (R13.11) Plan: Continue with current plan of care       GO               Sheryl Burnetamara Olivier Frayre, MS [facility] SLP [phone]  Sheryl Horn, Sheryl Horn 04/18/2017, 3:06 PM

## 2017-04-18 NOTE — Evaluation (Addendum)
Physical Therapy Evaluation Patient Details Name: Sheryl Horn MRN: [id number] DOB: 10-17-1945 Today's Date: 04/18/2017   History of Present Illness  71 y.o. female with history of advanced dementia, afib, HTN was brought to the ER with increasing confusion and behavioral disturbance; found to be in afib.  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 Pt currently with functional limitations due to the deficits listed below (see PT Problem List).  Pt will benefit from skilled PT to increase their independence and safety with mobility to allow discharge to the venue listed below.  Pt attempting to assist and follows commands with multimodal cues and increased time.  Pt from ALF, would recommend SNF upon d/c. Pt on 2L oxygen and 100% saturatiosn on arrival.  Oxygen removed and pt assisted to sitting however Spo2 dropped to 86% on room air so reapplied 2L O2 [place].     Follow Up Recommendations SNF    Equipment Recommendations  None recommended by PT    Recommendations for Other Services       Precautions / Restrictions Precautions Precautions: Fall Precaution Comments: wearing mittens, monitor sats Restrictions Weight Bearing Restrictions: No      Mobility  Bed Mobility Overal bed mobility: Needs Assistance Bed Mobility: Supine to Sit;Sit to Supine     Supine to sit: Mod assist Sit to supine: Max assist   General bed mobility comments: assist to bring LEs over EOB, pt assisting with upper body  Transfers Overall transfer level: Needs assistance Equipment used: 2 person hand held assist Transfers: Sit to/from [employer] Transfers Sit to Stand: Mod assist Stand pivot transfers: Mod assist       General transfer comment: multimodal cues for technique, assist to rise and steady, pt assisted with pericare by RN, remained on 2L O2 [place]  Ambulation/Gait             General Gait  Details: pt appeared to want to return to bed after using BSC, would also need +2 if attempting ambulation  Stairs            Wheelchair Mobility    Modified Rankin (Stroke Patients Only)       Balance Overall balance assessment: Needs assistance         Standing balance support: Bilateral upper extremity supported Standing balance-Leahy Scale: Poor Standing balance comment: requiring UE support and external assist                             Pertinent Vitals/Pain Pain Assessment: Faces Faces Pain Scale: Hurts little more Pain Intervention(s): Limited activity within patient's tolerance;Monitored during session;Repositioned    Home Living Family/patient expects to be discharged to:: Assisted living                      Prior Function Level of Independence: Needs assistance         Comments: pt from ALF, poor historian, uncertain of baseline     Hand Dominance        Extremity/Trunk Assessment        Lower Extremity Assessment Lower Extremity Assessment: Generalized weakness       Communication   Communication:  (very little verbalizations)  Cognition Arousal/Alertness: Awake/alert Behavior During Therapy: Restless  General Comments: pt with hx of dementia, able to follow commands with time and cues, mostly verbalized "no" and "please"      General Comments      Exercises     Assessment/Plan    PT Assessment Patient needs continued PT services  PT Problem List Decreased strength;Decreased mobility;Decreased activity tolerance;Decreased balance;Decreased cognition       PT Treatment Interventions DME instruction;Gait training;Therapeutic exercise;Therapeutic activities;Functional mobility training;Patient/family education    PT Goals (Current goals can be found in the Care Plan section)  Acute Rehab PT Goals PT Goal Formulation: All assessment and education complete,  DC therapy    Frequency Min 2X/week   Barriers to discharge        Co-evaluation               AM-PAC PT "6 Clicks" Daily Activity  Outcome Measure Difficulty turning over in bed (including adjusting bedclothes, sheets and blankets)?: Unable Difficulty moving from lying on back to sitting on the side of the bed? : Unable Difficulty sitting down on and standing up from a chair with arms (e.g., wheelchair, bedside commode, etc,.)?: Unable Help needed moving to and from a bed to chair (including a wheelchair)?: Total Help needed walking in hospital room?: Total Help needed climbing 3-5 steps with a railing? : Total 6 Click Score: 6    End of Session Equipment Utilized During Treatment: Gait belt Activity Tolerance: Patient tolerated treatment well Patient left: in bed;with call bell/phone within reach;with bed alarm set (telesitter) Nurse Communication: Mobility status PT Visit Diagnosis: Difficulty in walking, not elsewhere classified (R26.2)    Time: [id number] PT Time Calculation (min) (ACUTE ONLY): 16 min   Charges:   PT Evaluation $PT Eval Low Complexity: 1 Low     PT G CodesZenovia Jarred:        Kati Mitsugi Schrader, PT, DPT 04/18/2017 Pager: [phone]  Maida SaleLEMYRE,KATHrine E 04/18/2017, 12:45 PM

## 2017-04-18 NOTE — Consult Note (Signed)
[facility] Face-to-Face Psychiatry Consult   Reason for Consult:  Dementia and status post stroke. Referring Physician:  EDP Patient Identification: Sheryl Horn MRN:  [id number] Principal Diagnosis: <principal problem not specified> Diagnosis:   Patient Active Problem List   Diagnosis Date Noted  . Thoracic aorta atherosclerosis ([place]) [I70.0] 04/13/2017  . Atrial fibrillation with rapid ventricular response ([place]) [I48.91] 04/11/2017  . Encounter for palliative care [Z51.5]   . Goals of care, counseling/discussion [Z71.89]   . Acute encephalopathy [G93.40] 06/23/2015  . Malnutrition of moderate degree [E44.0] 06/23/2015  . HCAP (healthcare-associated pneumonia) [J18.9] 06/10/2015  . Atrial fibrillation ([place]) [I48.91] 06/10/2015  . HTN (hypertension) [I10] 06/10/2015  . Dementia with behavioral disturbance [F03.91] 06/10/2015  . Anxiety [F41.9] 06/10/2015    Total Time spent with patient: 45 minutes  Subjective:   Sheryl Horn is a 71 y.o. female patient admitted with advanced demnetia.  HPI: Sheryl Horn is a 71 years old female admitted to [facility] for managing atrial fibrillation with RVR. Patient was initially brought in by emergency medical services with the request of patient Brother for decreased mental status from her baseline and also reportedly being agitated at the skilled nursing facility. Patient knows her first name, last name and being in a hospital but not able to provide any verbal responses regarding Mini-Mental Status Examination. Spoke with the patient Brother who is a medical care power of attorney reported patient is much better baseline in July 2018 reportedly able to communicate with the and able to walk around and has little difficulty eating. He also feels that facility provider has been providing medication with the higher doses each may be reason for current mental status. Patient also known as bipolar disorder and also disrobbing herself in the facility  without identifying her surroundings. [facility] emergency psychiatrist recommended inpatient geriatric hospitalization for psychiatric treatment needs once medically stable. Patient Brother requesting to review her medications and adjust as required to make her back to her baseline functioning.  Past Psychiatric History: Patient has a long history of bipolar disorder and now with the advanced stages of dementia.  04/18/2017 Interval history: Patient seen and case discussed with the CSW for this face-to-face psychiatric consultation evaluation follow-up. Patient is awake, sitting on her bed with the meal tray in front of her. Patient seems to be able to drink and eat part of her lunch meal. Patient acknowledges this provider by saying hello but did not respond receptive questions. Patient continued to be demented and seems to be in advanced stage and has less social her verbal communication. Reportedly she was also found to have a stroke. Patient has no reported irritability, agitation and aggressive behavior. Patient is not responding to internal stimuli. Psychiatric consultation will sign off at this time and may contact back if we can offer any help with this pleasant patient.   Risk to Self: Suicidal Ideation: No Suicidal Intent: No Is patient at risk for suicide?: No Suicidal Plan?: No Access to Means: No What has been your use of drugs/alcohol within the last 12 months?:  (per brother... "Patient is a former alcoholic") How many times?:  (0) Other Self Harm Risks:  (no self harm ) Triggers for Past Attempts: Other (Comment) (no triggers for past attempts or gestures ) Intentional Self Injurious Behavior: None Risk to Others: Homicidal Ideation: No Thoughts of Harm to Others: No Current Homicidal Intent: No Current Homicidal Plan: No Access to Homicidal Means: No Identified Victim:  (n/a) History of harm to  others?: No Assessment of Violence: None Noted Violent Behavior Description:   (patient is calm and cooperative ) Does patient have access to weapons?: No Criminal Charges Pending?: No Does patient have a court date: No Prior Inpatient Therapy:   Prior Outpatient Therapy: Prior Outpatient Therapy: No Prior Therapy Dates:  (n/a) Prior Therapy Facilty/Provider(s):  (n/a) Reason for Treatment:  (n/a') Does patient have an ACCT team?: No Does patient have Intensive In-House Services?  : No Does patient have [facility] services? : No Does patient have P4CC services?: No  Past Medical History:  Past Medical History:  Diagnosis Date  . A-fib ([place])   . Anxiety   . Dementia   . Depression   . Hypertension    History reviewed. No pertinent surgical history. Family History:  Family History  Problem Relation Age of Onset  . Family history unknown: Yes   Family Psychiatric  History: Unknown Social History:  History  Alcohol Use No     History  Drug Use No    Social History   Social History  . Marital status: Unknown    Spouse name: N/A  . Number of children: N/A  . Years of education: N/A   Social History Main Topics  . Smoking status: Never Smoker  . Smokeless tobacco: Current User    Types: Snuff, Chew  . Alcohol use No  . Drug use: No  . Sexual activity: No   Other Topics Concern  . None   Social History Narrative  . None   Additional Social History:    Allergies:   Allergies  Allergen Reactions  . Bacitracin Other (See Comments)    Reaction unknown per MAR  . Other Other (See Comments)    HMG-COA reductase inhibitors - reaction unknown per MAR  . Penicillins Other (See Comments)    Reaction unknown per MAR  . Statins Other (See Comments)    Reaction unknown per [facility]    Labs:  Results for orders placed or performed during the hospital encounter of 04/10/17 (from the past 48 hour(s))  Basic metabolic panel     Status: Abnormal   Collection Time: 04/17/17  6:00 AM  Result Value Ref Range   Sodium 136 135 - 145 mmol/L   Potassium  3.6 3.5 - 5.1 mmol/L   Chloride 102 101 - 111 mmol/L   CO2 23 22 - 32 mmol/L   Glucose, Bld 94 65 - 99 mg/dL   BUN 9 6 - 20 mg/dL   Creatinine, Ser 0.56 0.44 - 1.00 mg/dL   Calcium 8.8 (L) 8.9 - 10.3 mg/dL   GFR calc non Af Amer >60 >60 mL/min   GFR calc Af Amer >60 >60 mL/min    Comment: (NOTE) The eGFR has been calculated using the CKD EPI equation. This calculation has not been validated in all clinical situations. eGFR's persistently <60 mL/min signify possible Chronic Kidney Disease.    Anion gap 11 5 - 15  CBC     Status: None   Collection Time: 04/17/17  6:00 AM  Result Value Ref Range   WBC 8.8 4.0 - 10.5 K/uL   RBC 4.62 3.87 - 5.11 MIL/uL   Hemoglobin 14.2 12.0 - 15.0 g/dL   HCT 42.3 36.0 - 46.0 %   MCV 91.6 78.0 - 100.0 fL   MCH 30.7 26.0 - 34.0 pg   MCHC 33.6 30.0 - 36.0 g/dL   RDW 14.4 11.5 - 15.5 %   Platelets 150 150 - 400 K/uL  Culture, blood (routine x 2)     Status: None (Preliminary result)   Collection Time: 04/17/17 10:46 AM  Result Value Ref Range   Specimen Description BLOOD RIGHT ARM    Special Requests      BOTTLES DRAWN AEROBIC ONLY Blood Culture results may not be optimal due to an inadequate volume of blood received in culture bottles   Culture      NO GROWTH < 24 HOURS Performed at [facility] [address] [place], [place] [address]    Report Status PENDING   Culture, blood (routine x 2)     Status: None (Preliminary result)   Collection Time: 04/17/17 11:01 AM  Result Value Ref Range   Specimen Description BLOOD RIGHT ANTECUBITAL    Special Requests IN PEDIATRIC BOTTLE Blood Culture adequate volume    Culture      NO GROWTH < 24 HOURS Performed at [facility], [place] [address] [place], [place] [address]    Report Status PENDING   Brain natriuretic peptide     Status: Abnormal   Collection Time: 04/17/17 11:03 AM  Result Value Ref Range   B Natriuretic Peptide 442.4 (H) 0.0 - 100.0 pg/mL  Hemoglobin A1c     Status: None    Collection Time: 04/18/17  5:04 AM  Result Value Ref Range   Hgb A1c MFr Bld 5.3 4.8 - 5.6 %    Comment: (NOTE) Pre diabetes:          5.7%-6.4% Diabetes:              >6.4% Glycemic control for   <7.0% adults with diabetes    Mean Plasma Glucose 105.41 mg/dL    Comment: Performed at [facility] [address], [place], [place] [address]  Lipid panel     Status: Abnormal   Collection Time: 04/18/17  5:04 AM  Result Value Ref Range   Cholesterol 157 0 - 200 mg/dL   Triglycerides 98 <150 mg/dL   HDL 27 (L) >40 mg/dL   Total CHOL/HDL Ratio 5.8 RATIO   VLDL 20 0 - 40 mg/dL   LDL Cholesterol 110 (H) 0 - 99 mg/dL    Comment:        Total Cholesterol/HDL:CHD Risk Coronary Heart Disease Risk Table                     Men   Women  1/2 Average Risk   3.4   3.3  Average Risk       5.0   4.4  2 X Average Risk   9.6   7.1  3 X Average Risk  23.4   11.0        Use the calculated Patient Ratio above and the CHD Risk Table to determine the patient's CHD Risk.        ATP III CLASSIFICATION (LDL):  <100     mg/dL   Optimal  100-129  mg/dL   Near or Above                    Optimal  130-159  mg/dL   Borderline  160-189  mg/dL   High  >190     mg/dL   Very High Performed at [facility] [address], [place], [place] [address]     Current Facility-Administered Medications  Medication Dose Route Frequency Provider Last Rate Last Dose  . acetaminophen (TYLENOL) tablet 650 mg  650 mg Oral Q6H PRN Eduard Clos, MD   650 mg at 04/16/17 1714   Or  . acetaminophen (TYLENOL) suppository 650 mg  650 mg Rectal Q6H PRN Eduard Clos, MD      . albuterol (PROVENTIL) (2.5 MG/3ML) 0.083% nebulizer solution 3 mL  3 mL Inhalation Q6H PRN Eduard Clos, MD      . apixaban Everlene Balls) tablet 5 mg  5 mg Oral BID Linwood Dibbles, MD   5 mg at 04/17/17 2238  . asenapine (SAPHRIS) sublingual tablet 5 mg  5 mg Sublingual Q12H PRN Leatha Gilding, MD   5 mg at 04/13/17  0450  . azithromycin (ZITHROMAX) 500 mg in dextrose 5 % 250 mL IVPB  500 mg Intravenous Q24H Regalado, Belkys A, MD   Stopped at 04/17/17 1015  . digoxin (LANOXIN) 0.25 MG/ML injection 0.25 mg  0.25 mg Intravenous Daily Leatha Gilding, MD   0.25 mg at 04/17/17 0912  . diltiazem (CARDIZEM) tablet 90 mg  90 mg Oral Q8H Leatha Gilding, MD   90 mg at 04/18/17 0524  . divalproex (DEPAKOTE SPRINKLE) capsule 250 mg  250 mg Oral TID Thedore Mins, MD   250 mg at 04/17/17 2200  . donepezil (ARICEPT) tablet 5 mg  5 mg Oral QHS Regalado, Belkys A, MD   5 mg at 04/17/17 2238  . famotidine (PEPCID) tablet 20 mg  20 mg Oral Daily Linwood Dibbles, MD   20 mg at 04/17/17 0913  . furosemide (LASIX) injection 20 mg  20 mg Intravenous Daily Regalado, Belkys A, MD   20 mg at 04/17/17 0920  . haloperidol lactate (HALDOL) injection 2 mg  2 mg Intravenous Q6H PRN Opyd, Lavone Neri, MD      . hydrALAZINE (APRESOLINE) injection 10 mg  10 mg Intravenous Q4H PRN Leatha Gilding, MD   10 mg at 04/16/17 1714  . LORazepam (ATIVAN) tablet 0.5 mg  0.5 mg Oral Q8H Eduard Clos, MD   0.5 mg at 04/18/17 0524  . MEDLINE mouth rinse  15 mL Mouth Rinse BID Leatha Gilding, MD   15 mL at 04/17/17 0923  . memantine (NAMENDA) tablet 10 mg  10 mg Oral Daily Leata Mouse, MD   10 mg at 04/17/17 0913  . metoprolol succinate (TOPROL-XL) 24 hr tablet 100 mg  100 mg Oral Daily Othella Boyer, MD   100 mg at 04/17/17 0913  . metoprolol tartrate (LOPRESSOR) injection 5 mg  5 mg Intravenous Q1H PRN Leatha Gilding, MD   5 mg at 04/13/17 0707  . ondansetron (ZOFRAN) tablet 4 mg  4 mg Oral Q6H PRN Eduard Clos, MD       Or  . ondansetron [facility]) injection 4 mg  4 mg Intravenous Q6H PRN Eduard Clos, MD      . piperacillin-tazobactam (ZOSYN) IVPB 3.375 g  3.375 g Intravenous Q8H Poindexter, Leann T, RPH 12.5 mL/hr at 04/18/17 0500 3.375 g at 04/18/17 0500  . potassium chloride SA (K-DUR,KLOR-CON) CR  tablet 20 mEq  20 mEq Oral Daily Regalado, Belkys A, MD   20 mEq at 04/17/17 0913  . RESOURCE THICKENUP CLEAR 1 g  1 g Oral TID WC & HS Regalado, Belkys A, MD   1 g at 04/18/17 0858  . traZODone (DESYREL) tablet 50 mg  50 mg Oral QHS Akintayo, Mojeed, MD   50 mg at 04/17/17 2300    Musculoskeletal: Strength & Muscle Tone: within normal  limits Gait & Station: normal Patient leans: N/A  Psychiatric Specialty Exam: Physical Exam  Constitutional: She appears well-developed.  HENT:  Head: Normocephalic.  Respiratory: Effort normal.  Psychiatric: She is agitated and aggressive. She expresses inappropriate judgment. She exhibits a depressed mood. She is noncommunicative. She exhibits abnormal recent memory and abnormal remote memory.    Review of Systems  Psychiatric/Behavioral: Positive for depression and memory loss (advanced dementia). Negative for hallucinations, substance abuse and suicidal ideas. The patient is not nervous/anxious and does not have insomnia.   All other systems reviewed and are negative.   Blood pressure 136/77, pulse 98, temperature 99.6 F (37.6 C), temperature source Axillary, resp. rate 18, height '5\' 6"'$  (1.676 m), weight 62.2 kg (137 lb 2 oz), SpO2 92 %.Body mass index is 22.13 kg/m.  General Appearance: Disheveled  Eye Contact:  Fair  Speech:  Slow and nonsensical  Volume:  Normal  Mood:  Depressed and aggitated  Affect:  dementia  Thought Process:  Disorganized  Orientation:  Other:  Dementia  Thought Content:  Dementia  Suicidal Thoughts:  No  Homicidal Thoughts:  No  Memory:  Dementia  Judgement:  Other:  Dementia  Insight:  Dementia  Psychomotor Activity:  Increased  Concentration:  Concentration: Dementia and Attention Span: Dementia  Recall:  Dementia  Fund of Knowledge:  Dementia  Language:  Fair  Akathisia:  No  Handed:  Right  AIMS (if indicated):     Assets:  Financial Resources/Insurance Housing Social Support  ADL's:  Impaired,  dementia  Cognition:  Impaired,  Severe  Sleep:        Treatment Plan Summary: 71 years old female with the advanced dementia and history of bipolar disorder,from skilled nursing facility with memory care unit admitted to [facility] for increased agitation and decreased mental status.  Patient has been free from irritability, agitation or aggressive behavior since admitted to the hospital and found she had a stroke and neurology team has been working on it.    Recommendation: Discontinue Saphris, Buspar and reduced Nemenda May use Haldol 2 mg IV every 6 hours as needed for agitation  Depakote sprinkles 250 mg 3 times daily for mood swings  Valproic acid level is 49 which is subtherapeutic  Depakote sprinkles can be increased to thousand milligrams a day if needed for mood swings, and agitation  Trazodone 50 mg at bedtime for insomnia Nemenda 10 mg QD for dementia  Appreciate psychiatric consultation and we sign off as of today Please contact [phone] or [phone] if needs further assistance  Disposition: No evidence of imminent risk to self or others at present.   Patient does not meet criteria for psychiatric inpatient admission. Supportive therapy provided about ongoing stressors.  Unit CSW has been working on skilled nursing facility with the memory unit when medically stable.   Ambrose Finland, MD 04/18/2017 10:00 AM

## 2017-04-18 NOTE — Progress Notes (Signed)
Echocardiogram completed, with results as follows:  - Left ventricle: The cavity size was normal. There was mild concentric hypertrophy. Systolic function was normal. The   estimated ejection fraction was in the range of 55% to 60%. Wall motion was normal; there were no regional wall motion   abnormalities. The study is not technically sufficient to allow evaluation of LV diastolic function. - Aortic valve: Trileaflet; mildly thickened, mildly calcified leaflets. There was moderate regurgitation. - Mitral valve: Calcified annulus. There was mild regurgitation. - Left atrium: The atrium was mildly dilated. - Right ventricle: The cavity size was mildly dilated. Wall thickness was normal. - Right atrium: The atrium was mildly dilated. - Tricuspid valve: There was mild regurgitation. - Pulmonary arteries: Systolic pressure was mildly increased. PA peak pressure: 31 mm Hg (S).  A/R: 1. No thrombus seen on TTE.  2. A TEE could be obtained but even if LAA thrombus was detected, would not likely change management as patient is on anticoagulation with apixaban for atrial fibrillation 3. Carotid ultrasound and MRA head are pending.   Electronically signed: Dr. Caryl PinaEric Brenya Taulbee

## 2017-04-18 NOTE — [place] FL2 (Signed)
[place] MEDICAID FL2 LEVEL OF CARE SCREENING TOOL     IDENTIFICATION  Patient Name: Sheryl Horn Birthdate: 05/07/1946 Sex: female Admission Date (Current Location): 04/10/2017  [facility] [place]   Facility and Address:  [facility],  [address], [place] [id number]      Provider Number: [id number]  Attending Physician Name and Address:  Alba Coryegalado, Belkys A, MD  Relative Name and Phone Number:       Current Level of Care: Hospital Recommended Level of Care: Skilled Nursing Facility Prior Approval Number:    Date Approved/Denied:   PASRR Number:    Discharge Plan: SNF    Current Diagnoses: Patient Active Problem List   Diagnosis Date Noted  . Thoracic aorta atherosclerosis (HCC) 04/13/2017  . Atrial fibrillation with rapid ventricular response (HCC) 04/11/2017  . Encounter for palliative care   . Goals of care, counseling/discussion   . Acute encephalopathy 06/23/2015  . Malnutrition of moderate degree 06/23/2015  . HCAP (healthcare-associated pneumonia) 06/10/2015  . Atrial fibrillation (HCC) 06/10/2015  . HTN (hypertension) 06/10/2015  . Dementia with behavioral disturbance 06/10/2015  . Anxiety 06/10/2015    Orientation RESPIRATION BLADDER Height & Weight     Self  O2 Incontinent Weight: 137 lb 2 oz (62.2 kg) Height:  5\' 6"  (167.6 cm)  BEHAVIORAL SYMPTOMS/MOOD NEUROLOGICAL BOWEL NUTRITION STATUS     (Hx of stroke) Incontinent Diet (DYS 1)  AMBULATORY STATUS COMMUNICATION OF NEEDS Skin   Extensive Assist Verbally (Speaks with difficulty)                         Personal Care Assistance Level of Assistance  Bathing, Feeding, Dressing Bathing Assistance: Maximum assistance Feeding assistance: Limited assistance Dressing Assistance: Maximum assistance     Functional Limitations Info             SPECIAL CARE FACTORS FREQUENCY  PT (By licensed PT), OT (By licensed OT)     PT Frequency: 5x OT Frequency: 5x             Contractures      Additional Factors Info  Code Status, Allergies Code Status Info: Full Code Allergies Info: Bacitracin,HMG-COA reductase inhibitors,Penicillins,Statins           Current Medications (04/18/2017):  This is the current hospital active medication list Current Facility-Administered Medications  Medication Dose Route Frequency Provider Last Rate Last Dose  . acetaminophen (TYLENOL) tablet 650 mg  650 mg Oral Q6H PRN Eduard ClosKakrakandy, Arshad N, MD   650 mg at 04/16/17 1714   Or  . acetaminophen (TYLENOL) suppository 650 mg  650 mg Rectal Q6H PRN Eduard ClosKakrakandy, Arshad N, MD      . albuterol (PROVENTIL) (2.5 MG/3ML) 0.083% nebulizer solution 3 mL  3 mL Inhalation Q6H PRN Eduard ClosKakrakandy, Arshad N, MD      . apixaban Everlene Balls(ELIQUIS) tablet 5 mg  5 mg Oral BID Linwood DibblesKnapp, Jon, MD   5 mg at 04/18/17 1006  . azithromycin (ZITHROMAX) 500 mg in dextrose 5 % 250 mL IVPB  500 mg Intravenous Q24H Regalado, Belkys A, MD   Stopped at 04/18/17 1116  . digoxin (LANOXIN) 0.25 MG/ML injection 0.25 mg  0.25 mg Intravenous Daily Leatha GildingGherghe, Costin M, MD   0.25 mg at 04/18/17 1006  . diltiazem (CARDIZEM) tablet 90 mg  90 mg Oral Q8H Leatha GildingGherghe, Costin M, MD   90 mg at 04/18/17 1437  . divalproex (DEPAKOTE SPRINKLE) capsule 250 mg  250 mg  Oral TID Thedore Mins, MD   250 mg at 04/18/17 1006  . donepezil (ARICEPT) tablet 5 mg  5 mg Oral QHS Regalado, Belkys A, MD   5 mg at 04/17/17 2238  . ezetimibe (ZETIA) tablet 10 mg  10 mg Oral Daily Regalado, Belkys A, MD      . famotidine (PEPCID) tablet 20 mg  20 mg Oral Daily Linwood Dibbles, MD   20 mg at 04/18/17 1031  . feeding supplement (ENSURE ENLIVE) (ENSURE ENLIVE) liquid 237 mL  237 mL Oral BID BM Regalado, Belkys A, MD   237 mL at 04/18/17 1437  . [START ON 04/19/2017] furosemide (LASIX) tablet 20 mg  20 mg Oral Daily Regalado, Belkys A, MD      . haloperidol lactate (HALDOL) injection 2 mg  2 mg Intravenous Q6H PRN Opyd, Lavone Neri, MD      . hydrALAZINE  (APRESOLINE) injection 10 mg  10 mg Intravenous Q4H PRN Leatha Gilding, MD   10 mg at 04/16/17 1714  . [START ON 04/19/2017] Influenza vac split quadrivalent PF (FLUZONE HIGH-DOSE) injection 0.5 mL  0.5 mL Intramuscular Tomorrow-1000 Regalado, Belkys A, MD      . LORazepam (ATIVAN) tablet 0.5 mg  0.5 mg Oral Q8H Eduard Clos, MD   0.5 mg at 04/18/17 1440  . MEDLINE mouth rinse  15 mL Mouth Rinse BID Leatha Gilding, MD   15 mL at 04/18/17 1032  . memantine (NAMENDA) tablet 10 mg  10 mg Oral Daily Leata Mouse, MD   10 mg at 04/18/17 1006  . metoprolol succinate (TOPROL-XL) 24 hr tablet 100 mg  100 mg Oral Daily Othella Boyer, MD   100 mg at 04/18/17 1006  . metoprolol tartrate (LOPRESSOR) injection 5 mg  5 mg Intravenous Q1H PRN Leatha Gilding, MD   5 mg at 04/13/17 0707  . ondansetron (ZOFRAN) tablet 4 mg  4 mg Oral Q6H PRN Eduard Clos, MD       Or  . ondansetron [facility]) injection 4 mg  4 mg Intravenous Q6H PRN Eduard Clos, MD      . piperacillin-tazobactam (ZOSYN) IVPB 3.375 g  3.375 g Intravenous Q8H Donell Beers, RPH   Stopped at 04/18/17 1603  . potassium chloride SA (K-DUR,KLOR-CON) CR tablet 20 mEq  20 mEq Oral Daily Regalado, Belkys A, MD   20 mEq at 04/18/17 1006  . RESOURCE THICKENUP CLEAR 1 g  1 g Oral TID WC & HS Regalado, Belkys A, MD   1 g at 04/18/17 0858  . traZODone (DESYREL) tablet 50 mg  50 mg Oral QHS Akintayo, Mojeed, MD   50 mg at 04/17/17 2300     Discharge Medications: Please see discharge summary for a list of discharge medications.  Relevant Imaging Results:  Relevant Lab Results:   Additional Information SSN: [id number]  Antionette Poles, LCSW

## 2017-04-18 NOTE — Progress Notes (Addendum)
OT Cancellation Note  Patient Details Name: Sheryl Horn MRN: [id number] DOB: 04/07/1946   Cancelled Treatment:    Reason Eval/Treat Not Completed: Other (comment).  Plan is for pt to go to SNF. Will defer OT to next venue  Kimiyo Carmicheal 04/18/2017, 12:03 PM  Marica OtterMaryellen Elyna Pangilinan, OTR/L [phone] 04/18/2017

## 2017-04-19 ENCOUNTER — Inpatient Hospital Stay (HOSPITAL_COMMUNITY): Payer: Medicare Other

## 2017-04-19 LAB — BASIC METABOLIC PANEL
Anion gap: 8 (ref 5–15)
BUN: 16 mg/dL (ref 6–20)
CALCIUM: 8.6 mg/dL — AB (ref 8.9–10.3)
CO2: 24 mmol/L (ref 22–32)
CREATININE: 0.79 mg/dL (ref 0.44–1.00)
Chloride: 109 mmol/L (ref 101–111)
GFR calc Af Amer: >60 mL/min (ref >60)
GFR calc non Af Amer: >60 mL/min (ref >60)
GLUCOSE: 97 mg/dL (ref 65–99)
Potassium: 3.6 mmol/L (ref 3.5–5.1)
Sodium: 141 mmol/L (ref 135–145)

## 2017-04-19 MED ORDER — IOPAMIDOL (ISOVUE-370) INJECTION 76%
INTRAVENOUS | Status: AC
  Administered 2017-04-19: 50 mL
  Filled 2017-04-19: qty 100

## 2017-04-19 NOTE — Progress Notes (Signed)
CSW spoke with patient's brother Dalis Beers [phone]) regarding discharge planning moving forward. CSW informed that PT recommended ST rehab for patient at SNF. Patient's brother reported that he was agreeable and that he prefers [facility]. Patient's brother and CSW discussed that patient had been psychiatrically cleared. Patient's brother verbalized understanding of plan for patient to discharge to SNF for ST rehab. CSW completed FL2 and will contact [facility] to inquire about ability to offer patient a bed. CSW will continue to follow and assist with discharge planning.   Celso Sickle, [place] Clinical Social Worker [facility] Cell#: [phone]

## 2017-04-19 NOTE — Progress Notes (Signed)
PHARMACY NOTE -  Zosyn  Pharmacy has been assisting with dosing of Zosyn for CAP. Dosage remains stable at 3.375 g IV q8 hr (extended infusion) and need for further dosage adjustment appears unlikely at present.    Will sign off at this time.  Please reconsult if a change in clinical status warrants re-evaluation of dosage.   Bernadene Person, PharmD, BCPS Pager: [phone] 04/19/2017, 1:47 PM

## 2017-04-19 NOTE — Progress Notes (Signed)
SLP Cancellation Note  Patient Details Name: Sheryl Horn MRN: [id number] DOB: 08/13/1945   Cancelled treatment:       Reason Eval/Treat Not Completed: Other (comment) (pt getting care from nurse techs, RN reports good tolerance of po but pt requires assistance)   Mills Koller, MS [facility] SLP [phone]

## 2017-04-19 NOTE — Progress Notes (Signed)
Duplicate order received for Carotid Duplex. Please refer to below progress note from 9/12. There was an error with the carotid report that is being fixed since yesterday 9/13, in that the echo attached to the carotid in results review. - - - Vascular Ultrasound Carotid Duplex (Doppler) has been completed.  Preliminary findings: Bilateral: No significant (1-39%) ICA stenosis. Antegrade vertebral flow.    Farrel Demark, RDMS, RVT  04/17/2017, 10:21 AM

## 2017-04-19 NOTE — Progress Notes (Signed)
PROGRESS NOTE  Sheryl Horn ZOX:[id number] DOB: 1946/07/03 DOA: 04/10/2017 PCP: System, Provider Not In   LOS: 7 days   Brief Narrative / Interim history: Sheryl Horn is a 71 y.o. female with history of advanced dementia was brought to the ER with increasing confusion and behavioral disturbance. In the ER patient was seen by behavioral health team and advised inpatient psychiatric admission and patient was accepted at [facility]. Patient was waiting for transfer when patient's heart rate increased and was found to be in A. fib with RVR. Patient has advanced dementia and does not provide any history. Patient has a known history of A. fib and is on metoprolol and Cardizem for rate control and on Apixaban.  Clinically she improved, she was transitioned off of Cardizem infusion back on her oral regimen and was transferred to the floor on 9/11.  Assessment & Plan: Active Problems:   HTN (hypertension)   Dementia with behavioral disturbance   Atrial fibrillation with rapid ventricular response (HCC)   Thoracic aorta atherosclerosis (HCC)  Acute left cerebellar stoke;  PT, OT, Speech evaluation.  Patient will remain at [facility].  Unable to tolerate MRA. CTA ordered.  Started  Zetia, patient with allergy to statin Already on eliquis Carotid doppler negative.   Acute Hypoxic Respiratory Failure;  Related to PNA.  Chest x ray with PNA vs pulmonary edema.  Having fevers. Antibiotics change to zosyn and azithromycin.  Change IV lasix to oral.  Respiratory status stable.  Stop azithro. Continue with zosyn   A. fib with RVR -Patient was initiated on Cardizem infusion with improvement in her rates.  She was converted to p.o. metoprolol as well as carvedilol 9/6, however overnight towards 9/7 her rates have gone back up into the 150s.  She was started back again on Cardizem IV and digoxin, and transitioned back to p.o. 9/10 -Consulted cardiology, appreciate input, signed off from  9/10 -Continue anticoagulation with apixaban -Rate controlled. Continue with metoprolol and Cardizem.   Sepsis due to pneumonia -Meet sepsis criteria with fever, elevated heart rate, and a source -Started on ceftriaxone and azithromycin in the emergency room. Received 3 days.  -continue to spike fever, ct chest showed multifocal pneumonia.  -Started IV zosyn 9-12, restarted azithromycin 9-2. plan to discharge patient on Augmentin  -blood culture no growth to date.    Acute kidney injury -Likely due to dehydration, elevated heart rates as well as sepsis -Creatinine has now normalized  Dementia with behavioral disturbances Per  patient's brother [name],  patient has been diagnosed with Alzheimer's dementia 4-5 years ago, however he feels like her dementia was stable up until 2 months ago when she had an abrupt decline in her mental status, she became more aggressive and intermittently nonverbal / catatonic.  He feels like these changes are abrupt and he believes that they are medication related despite the fact that patient has been on the same regimen for few years.  She had an episode when she was hospitalized at [facility] few months ago and she was discharged off of her Depakote, and feels like her behavior changed then, however Depakote was later added back on in her memory care unit.  I called and discussed with the nurse at [facility] home and she felt like her recent changes are not that abrupt and in fact were somewhat of a gradual progression. -Given concern for acute changes about 2 months ago, an MRI of the brain was obtain  to rule out any  organic causes. MRI positive for stroke.  -Psych consulted also.  Patient will be discharge on Depakote. Which could be increase as needed for mood.  Continue with ativan.  namenda dose reduce to once a day.  Started low dose Aricept.    Hypertension -Blood pressure improving and now on the hypertensive side  Mild  thrombocytopenia -Stable, follow CBC, platelets 110, slightly worse likely in the setting of sepsis -Repeat CBC in the morning    DVT prophylaxis: Apixaban Code Status: Full code Family Communication: brother updated 9-13 Disposition Plan: SNF in 24 hours.  Consultants:   Cardiology  Procedures:   None   Antimicrobials:  Ceftriaxone 9/6 >>9-12  Azithromycin 9/6 >>  Subjective: She is feeling ok. She was able to tell me her brothers name today    Objective: Vitals:   04/18/17 1435 04/18/17 2209 04/19/17 0620 04/19/17 0926  BP: 131/77 109/77 127/71 123/64  Pulse: 99 89 79 80  Resp: Temp: 99 F (37.2 C) 99.1 F (37.3 C) 98.1 F (36.7 C)   TempSrc: Axillary Axillary Axillary   SpO2: 96% 92% 93%   Weight:      Height:        Intake/Output Summary (Last 24 hours) at 04/19/17 1443 Last data filed at 04/19/17 1026  Gross per 24 hour  Intake              100 ml  Output                0 ml  Net              100 ml   Filed Weights   04/11/17 1913 04/12/17 0701  Weight: 62.2 kg (137 lb 3.2 oz) 62.2 kg (137 lb 2 oz)    Examination:  Vitals:   04/18/17 1435 04/18/17 2209 04/19/17 0620 04/19/17 0926  BP: 131/77 109/77 127/71 123/64  Pulse: 99 89 79 80  Resp: Temp: 99 F (37.2 C) 99.1 F (37.3 C) 98.1 F (36.7 C)   TempSrc: Axillary Axillary Axillary   SpO2: 96% 92% 93%   Weight:      Height:       Constitutional: NAD  ENMT: MM Moist  Respiratory: CTA Cardiovascular: IRR Abdomen: BS present, soft, nt Neurologic: moves extremities passively, does not follows command.    Data Reviewed: I have independently reviewed following labs and imaging studies   CBC:  Recent Labs Lab 04/13/17 0329 04/14/17 0353 04/15/17 0757 04/17/17 0600  WBC 11.7* 9.1 8.3 8.8  HGB 14.8 14.5 13.2 14.2  HCT 44.4 42.8 39.6 42.3  MCV 93.7 92.8 93.2 91.6  PLT 143* 112* 110* 150   Basic Metabolic Panel:  Recent Labs Lab 04/13/17 0329  04/14/17 0353 04/15/17 0757 04/17/17 0600 04/19/17 0539  NA 144 140 140 136 141  K 3.8 3.6 3.8 3.6 3.6  CL 112* 111 110 102 109  CO2 GLUCOSE 105* 104* 95 94 97  BUN CREATININE 0.78 0.66 0.61 0.56 0.79  CALCIUM 8.8* 8.3* 8.4* 8.8* 8.6*   GFR: Estimated Creatinine Clearance: 58.6 mL/min (by C-G formula based on SCr of 0.79 mg/dL). Liver Function Tests: No results for input(s): AST, ALT, ALKPHOS, BILITOT, PROT, ALBUMIN in the last 168 hours. No results for input(s): LIPASE, AMYLASE in the last 168 hours. No results for input(s): AMMONIA in the last 168 hours. Coagulation Profile: No results  for input(s): INR, PROTIME in the last 168 hours. Cardiac Enzymes: No results for input(s): CKTOTAL, CKMB, CKMBINDEX, TROPONINI in the last 168 hours. BNP (last 3 results) No results for input(s): PROBNP in the last 8760 hours. HbA1C:  Recent Labs  04/18/17 0504  HGBA1C 5.3   CBG: No results for input(s): GLUCAP in the last 168 hours. Lipid Profile:  Recent Labs  04/18/17 0504  CHOL 157  HDL 27*  LDLCALC 110*  TRIG 98  CHOLHDL 5.8   Thyroid Function Tests: No results for input(s): TSH, T4TOTAL, FREET4, T3FREE, THYROIDAB in the last 72 hours. Anemia Panel: No results for input(s): VITAMINB12, FOLATE, FERRITIN, TIBC, IRON, RETICCTPCT in the last 72 hours. Urine analysis:    Component Value Date/Time   COLORURINE YELLOW 04/10/2017 1858   APPEARANCEUR CLEAR 04/10/2017 1858   LABSPEC 1.019 04/10/2017 1858   PHURINE 6.0 04/10/2017 1858   GLUCOSEU NEGATIVE 04/10/2017 1858   HGBUR NEGATIVE 04/10/2017 1858   BILIRUBINUR NEGATIVE 04/10/2017 1858   KETONESUR 5 (A) 04/10/2017 1858   PROTEINUR NEGATIVE 04/10/2017 1858   UROBILINOGEN 2.0 (H) 06/10/2015 1724   NITRITE NEGATIVE 04/10/2017 1858   LEUKOCYTESUR TRACE (A) 04/10/2017 1858   Sepsis Labs: Invalid input(s): PROCALCITONIN, LACTICIDVEN  Recent Results (from the past 240 hour(s))  MRSA PCR  Screening     Status: None   Collection Time: 04/11/17 10:30 PM  Result Value Ref Range Status   MRSA by PCR NEGATIVE NEGATIVE Final    Comment:        The GeneXpert MRSA Assay (FDA approved for NASAL specimens only), is one component of a comprehensive MRSA colonization surveillance program. It is not intended to diagnose MRSA infection nor to guide or monitor treatment for MRSA infections.   Culture, blood (routine x 2)     Status: None (Preliminary result)   Collection Time: 04/17/17 10:46 AM  Result Value Ref Range Status   Specimen Description BLOOD RIGHT ARM  Final   Special Requests   Final    BOTTLES DRAWN AEROBIC ONLY Blood Culture results may not be optimal due to an inadequate volume of blood received in culture bottles   Culture   Final    NO GROWTH 2 DAYS Performed at [facility], [address], [place], [place] [address]    Report Status PENDING  Incomplete  Culture, blood (routine x 2)     Status: None (Preliminary result)   Collection Time: 04/17/17 11:01 AM  Result Value Ref Range Status   Specimen Description BLOOD RIGHT ANTECUBITAL  Final   Special Requests IN PEDIATRIC BOTTLE Blood Culture adequate volume  Final   Culture   Final    NO GROWTH 2 DAYS Performed at [facility], [address] [place], [place] [address]    Report Status PENDING  Incomplete      Radiology Studies: No results found.   Scheduled Meds: . apixaban  5 mg Oral BID  . digoxin  0.25 mg Intravenous Daily  . diltiazem  90 mg Oral Q8H  . divalproex  250 mg Oral TID  . donepezil  5 mg Oral QHS  . ezetimibe  10 mg Oral Daily  . famotidine  20 mg Oral Daily  . feeding supplement (ENSURE ENLIVE)  237 mL Oral BID BM  . furosemide  20 mg Oral Daily  . Influenza vac split quadrivalent PF  0.5 mL Intramuscular Tomorrow-1000  . LORazepam  0.5 mg Oral Q8H  . mouth rinse  15  mL Mouth Rinse BID  . memantine  10 mg Oral Daily  . metoprolol succinate  100 mg Oral Daily   . potassium chloride  20 mEq Oral Daily  . RESOURCE THICKENUP CLEAR  1 g Oral TID WC & HS  . traZODone  50 mg Oral QHS   Continuous Infusions: . piperacillin-tazobactam (ZOSYN)  IV 3.375 g (04/19/17 1343)    Hartley Barefoot, MD [facility] Pager [phone]  If 7PM-7AM, please contact night-coverage [email or web address] Password [facility] 04/19/2017, 2:43 PM

## 2017-04-19 NOTE — Progress Notes (Signed)
CSW contacted [facility] and inquired about ability to offer patient a bed, patient was declined. CSW will continue to follow and assist with dc planning.   Celso Sickle, [place] Clinical Social Worker [facility] Cell#: [phone]

## 2017-04-20 MED ORDER — MEMANTINE HCL 10 MG PO TABS: 10.0000 mg | ORAL_TABLET | Freq: Every day | ORAL | 0 refills | Status: AC

## 2017-04-20 MED ORDER — LORAZEPAM 0.5 MG PO TABS: 0.5000 mg | ORAL_TABLET | Freq: Three times a day (TID) | ORAL | 0 refills | Status: AC

## 2017-04-20 MED ORDER — CLINDAMYCIN HCL 300 MG PO CAPS: 300.0000 mg | ORAL_CAPSULE | Freq: Three times a day (TID) | ORAL | 0 refills | Status: DC

## 2017-04-20 MED ORDER — EZETIMIBE 10 MG PO TABS: 10.0000 mg | ORAL_TABLET | Freq: Every day | ORAL | 0 refills | Status: AC

## 2017-04-20 MED ORDER — FUROSEMIDE 20 MG PO TABS: 20.0000 mg | ORAL_TABLET | Freq: Every day | ORAL | 0 refills | Status: AC

## 2017-04-20 MED ORDER — DONEPEZIL HCL 5 MG PO TABS: 5.0000 mg | ORAL_TABLET | Freq: Every day | ORAL | 0 refills | Status: AC

## 2017-04-20 MED ORDER — DILTIAZEM HCL 90 MG PO TABS: 90.0000 mg | ORAL_TABLET | Freq: Three times a day (TID) | ORAL | 0 refills | Status: AC

## 2017-04-20 MED ORDER — RESOURCE THICKENUP CLEAR PO POWD: 1.0000 g | Freq: Three times a day (TID) | ORAL | 1 refills | Status: AC

## 2017-04-20 NOTE — Progress Notes (Signed)
Pt is discharged, no bed placement at this time. Will cancel DC per M.D request.

## 2017-04-20 NOTE — Discharge Summary (Addendum)
Physician Discharge Summary  Sheryl Horn WJX:[id number] DOB: 07/21/1946 DOA: 04/10/2017  PCP: System, Provider Not In  Admit date: 04/10/2017 Discharge date: 04/20/2017  Admitted From: Memory care unit.  Disposition: SNF  Recommendations for Outpatient Follow-up:  1. Follow up with PCP in 1-2 weeks 2. Please obtain BMP/CBC in one week  Patient was not discharge today 9-15.    Discharge Condition: Stable.  CODE STATUS: Full code.  Diet recommendation: dysphagia diet 1  Brief/Interim Summary: Sheryl Horn a 72 y.o.femalewith history of advanced dementia was brought to the ER with increasing confusion and behavioral disturbance. In the ER patient was seen by behavioral health team and advised inpatient psychiatric admission and patient was accepted at [facility]. Patient was waiting for transfer when patient's heart rate increased and was found to be in A. fib with RVR. Patient has advanced dementia and does not provide any history. Patient has a known history of A. fib and is on metoprolol and Cardizem for rate control and on Apixaban.  Clinically she improved, she was transitioned off of Cardizem infusion back on her oral regimen and was transferred to the floor on 9/11.  Assessment & Plan: Active Problems:   HTN (hypertension)   Dementia with behavioral disturbance   Atrial fibrillation with rapid ventricular response (HCC)   Thoracic aorta atherosclerosis (HCC)  Acute left cerebellar stoke;  PT, OT, Speech evaluation.  Patient will remain at [facility].  Unable to tolerate MRA. CTA ordered.  Started  Zetia, patient with allergy to statin Already on eliquis Carotid doppler negative.  CTA no significant large vessel stenosis.   Acute Hypoxic Respiratory Failure;  Related to PNA.  Chest x ray with PNA vs pulmonary edema.  Having fevers. Antibiotics change to zosyn and azithromycin.  Change IV lasix to oral.  Respiratory status stable.  Stop azithro. Treated  with Zosyn for 5 days.   A. fib with RVR -Patient was initiated on Cardizem infusion with improvement in her rates.  She was converted to p.o. metoprolol as well as carvedilol 9/6, however overnight towards 9/7 her rates have gone back up into the 150s.  She was started back again on Cardizem IV and digoxin, and transitioned back to p.o. 9/10 -Consulted cardiology, appreciate input, signed off from 9/10 -Continue anticoagulation with apixaban -Rate controlled. Continue with metoprolol and Cardizem.   Sepsis due to pneumonia -Meet sepsis criteria with fever, elevated heart rate, and a source -Started on ceftriaxone and azithromycin in the emergency room. Received 3 days.  -continue to spike fever, ct chest showed multifocal pneumonia.  -Started IV zosyn 9-12, restarted azithromycin 9-2. plan to discharge patient on clindamycin for 2 more days.  -blood culture no growth to date.    Acute kidney injury -Likely due to dehydration, elevated heart rates as well as sepsis -Creatinine has now normalized  Dementia with behavioral disturbances Per  patient's brother [name],  patient has been diagnosed with Alzheimer's dementia 4-5 years ago, however he feels like her dementia was stable up until 2 months ago when she had an abrupt decline in her mental status, she became more aggressive and intermittently nonverbal / catatonic.  He feels like these changes are abrupt and he believes that they are medication related despite the fact that patient has been on the same regimen for few years.  She had an episode when she was hospitalized at [facility] few months ago and she was discharged off of her Depakote, and feels like her behavior changed then,  however Depakote was later added back on in her memory care unit.  I called and discussed with the nurse at [facility] home and she felt like her recent changes are not that abrupt and in fact were somewhat of a gradual progression. -Given  concern for acute changes about 2 months ago, an MRI of the brain was obtain  to rule out any organic causes. MRI positive for stroke.  -Psych consulted also.  Patient will be discharge on Depakote. Which could be increase as needed for mood.  Continue with ativan.  namenda dose reduce to once a day.  Started low dose Aricept.  per psych could use haldol PRN if needed. Patient has not required haldol in the hospital   Hypertension -Blood pressure improving and now on the hypertensive side  Mild thrombocytopenia -Stable, follow CBC, platelets 110, slightly worse likely in the setting of sepsis -Repeat CBC in the morning   Discharge Diagnoses:  Active Problems:   HTN (hypertension)   Dementia with behavioral disturbance   Atrial fibrillation with rapid ventricular response (HCC)   Thoracic aorta atherosclerosis [facility])    Discharge Instructions  Discharge Instructions    Diet - low sodium heart healthy    Complete by:  As directed    Increase activity slowly    Complete by:  As directed      Allergies as of 04/20/2017      Reactions   Bacitracin Other (See Comments)   Reaction unknown per [facility]   Other Other (See Comments)   HMG-COA reductase inhibitors - reaction unknown per [facility]   Penicillins Other (See Comments)   Reaction unknown per [facility]   Statins Other (See Comments)   Reaction unknown per [facility]      Medication List    STOP taking these medications   busPIRone 15 MG tablet Commonly known as:  BUSPAR   diltiazem 300 MG 24 hr capsule Commonly known as:  TIAZAC   metoprolol tartrate 25 MG tablet Commonly known as:  LOPRESSOR     TAKE these medications   alum & mag hydroxide-simeth 200-200-20 MG/5ML suspension Commonly known as:  MAALOX/MYLANTA Take 30 mLs by mouth every 6 (six) hours as needed for indigestion or heartburn (dyspepsia).   clindamycin 300 MG capsule Commonly known as:  CLEOCIN Take 1 capsule (300 mg total) by mouth 3 (three) times  daily. What changed:  when to take this   diltiazem 90 MG tablet Commonly known as:  CARDIZEM Take 1 tablet (90 mg total) by mouth every 8 (eight) hours.   divalproex 125 MG capsule Commonly known as:  DEPAKOTE SPRINKLE Take 250 mg by mouth 3 (three) times daily.   donepezil 5 MG tablet Commonly known as:  ARICEPT Take 1 tablet (5 mg total) by mouth at bedtime.   ELIQUIS 5 MG Tabs tablet Generic drug:  apixaban Take 5 mg by mouth 2 (two) times daily.   ezetimibe 10 MG tablet Commonly known as:  ZETIA Take 1 tablet (10 mg total) by mouth daily.   Fish Oil 1000 MG Caps Take 2 capsules by mouth 2 (two) times daily.   furosemide 20 MG tablet Commonly known as:  LASIX Take 1 tablet (20 mg total) by mouth daily. What changed:  medication strength  how much to take   LORazepam 0.5 MG tablet Commonly known as:  ATIVAN Take 1 tablet (0.5 mg total) by mouth every 8 (eight) hours.   Melatonin 10 MG Caps Take 1 capsule by mouth at  bedtime.   memantine 10 MG tablet Commonly known as:  NAMENDA Take 1 tablet (10 mg total) by mouth daily. What changed:  when to take this  Another medication with the same name was removed. Continue taking this medication, and follow the directions you see here.   metoprolol succinate 100 MG 24 hr tablet Commonly known as:  TOPROL-XL Take 100 mg by mouth daily. Take with or immediately following a meal.   NUTRA/SHAKE FREE PO Take 237 mLs by mouth 2 (two) times daily.   nystatin powder Commonly known as:  MYCOSTATIN/NYSTOP Apply 1 Bottle topically 2 (two) times daily.   potassium chloride SA 20 MEQ tablet Commonly known as:  K-DUR,KLOR-CON Take 20 mEq by mouth daily.   ranitidine 150 MG tablet Commonly known as:  ZANTAC Take 150 mg by mouth daily.   RESOURCE THICKENUP CLEAR Powd Take 1 g by mouth 4 (four) times daily -  with meals and at bedtime.   traZODone 50 MG tablet Commonly known as:  DESYREL Take 50 mg by mouth at  bedtime.   VENTOLIN HFA 108 (90 Base) MCG/ACT inhaler Generic drug:  albuterol Inhale 2 puffs into the lungs every 6 (six) hours as needed for wheezing or shortness of breath.            Discharge Care Instructions        Start     Ordered   04/21/17 0000  ezetimibe (ZETIA) 10 MG tablet  Daily     04/20/17 1241   04/21/17 0000  furosemide (LASIX) 20 MG tablet  Daily     04/20/17 1241   04/20/17 0000  diltiazem (CARDIZEM) 90 MG tablet  Every 8 hours     04/20/17 1241   04/20/17 0000  donepezil (ARICEPT) 5 MG tablet  Daily at bedtime     04/20/17 1241   04/20/17 0000  LORazepam (ATIVAN) 0.5 MG tablet  Every 8 hours     04/20/17 1241   04/20/17 0000  memantine (NAMENDA) 10 MG tablet  Daily     04/20/17 1241   04/20/17 0000  Maltodextrin-Xanthan Gum (RESOURCE THICKENUP CLEAR) POWD  3 times daily with meals & bedtime     04/20/17 1241   04/20/17 0000  Increase activity slowly     04/20/17 1241   04/20/17 0000  Diet - low sodium heart healthy     04/20/17 1241   04/20/17 0000  clindamycin (CLEOCIN) 300 MG capsule  3 times daily     04/20/17 1243      Allergies  Allergen Reactions  . Bacitracin Other (See Comments)    Reaction unknown per [facility]  . Other Other (See Comments)    HMG-COA reductase inhibitors - reaction unknown per [facility]  . Penicillins Other (See Comments)    Reaction unknown per [facility]  . Statins Other (See Comments)    Reaction unknown per [facility]    Consultations:  Psych  Neurology    Procedures/Studies: Ct Angio Head W Or Wo Contrast  Result Date: 04/19/2017 CLINICAL DATA:  Acute left cerebellar infarct on MRI. History of dementia. EXAM: CT ANGIOGRAPHY HEAD TECHNIQUE: Multidetector CT imaging of the head was performed using the standard protocol during bolus administration of intravenous contrast. Multiplanar CT image reconstructions and MIPs were obtained to evaluate the vascular anatomy. CONTRAST:  50 mL Isovue 370 COMPARISON:  Brain MRI 04/16/2017.  No  prior angiographic imaging. FINDINGS: CT HEAD Brain: 1.8 cm low-density focus posteriorly in the left cerebellum corresponds to the  acute infarct demonstrated on recent MRI. There is no evidence of significant infarct extension or associated hemorrhage. No mass, midline shift, or extra-axial fluid collection is seen. There is moderate cerebral atrophy. Patchy cerebral white matter hypodensities are nonspecific but compatible with moderate chronic small vessel ischemic disease. A punctate calcification is noted in the left pons. Vascular: Calcified atherosclerosis at the skullbase. Skull: No fracture focal osseous lesion. Sinuses: Trace left maxillary sinus fluid.  Clear mastoid air cells. Orbits: Lateral cataract extraction. CTA HEAD Anterior circulation: The internal carotid arteries are patent from skullbase to carotid termini. There is moderate siphon atherosclerosis resulting in up to mild cavernous and supraclinoid stenosis bilaterally. ACAs and MCAs are patent with mild branch vessel irregularity but no evidence of proximal branch occlusion or significant proximal stenosis. No aneurysm. Posterior circulation: The visualized distal vertebral arteries are patent to the basilar and codominant. Left V4 segment atherosclerosis results in up to mild stenosis. There is milder right V4 segment atherosclerosis without significant stenosis. Patent PICA and SCA origins are visualized bilaterally. AICAs are not identified. The basilar artery is widely patent. There is a patent right posterior communicating artery. The PCAs are patent with mild branch vessel irregularity bilaterally as well as a mild focal proximal P2 stenosis on the right. No aneurysm. Venous sinuses: Patent. Anatomic variants: None. Delayed phase: No abnormal enhancement. IMPRESSION: 1. Intracranial atherosclerosis without large vessel occlusion or flow limiting proximal stenosis. 2. Mild left V4, right P2, and bilateral ICA stenoses. 3. Small left  cerebellar infarct, unchanged in size from the prior MRI. Electronically Signed   By: Sebastian Ache M.D.   On: 04/19/2017 15:04   Dg Chest 2 View  Result Date: 04/10/2017 CLINICAL DATA:  Progressive altered mental status for 2 months. EXAM: CHEST  2 VIEW COMPARISON:  Radiographs 06/22/2015 FINDINGS: Lung volumes. Mild cardiomegaly. Tortuous atherosclerotic thoracic aorta. Interstitial and bronchial thickening which is similar allowing for differences in technique. Patchy bibasilar opacities, right greater than left, progressed from prior exam. No pleural fluid. No pneumothorax. The bones are under mineralized. IMPRESSION: 1. Patchy bibasilar opacities, right greater than left, persistent or recurrent from 2016 exam. This may reflect atelectasis superimposed on scarring versus sequela of aspiration in the appropriate clinical setting. 2. Mild cardiomegaly and atherosclerosis of the thoracic aorta. 3. Chronic interstitial and bronchial thickening, grossly stable allowing for lower lung volumes. Electronically Signed   By: Rubye Oaks M.D.   On: 04/10/2017 18:08   Ct Head Wo Contrast  Result Date: 04/10/2017 CLINICAL DATA:  Altered mental status. EXAM: CT HEAD WITHOUT CONTRAST TECHNIQUE: Contiguous axial images were obtained from the base of the skull through the vertex without intravenous contrast. COMPARISON:  CT scan of June 20, 2015. FINDINGS: Brain: Mild diffuse cortical atrophy is noted. Mild chronic ischemic white matter disease is noted. No mass effect or midline shift is noted. Ventricular size is within normal limits. There is no evidence of mass lesion, hemorrhage or acute infarction. Vascular: No hyperdense vessel or unexpected calcification. Skull: Normal. Negative for fracture or focal lesion. Sinuses/Orbits: No acute finding. Other: None. IMPRESSION: Mild diffuse cortical atrophy. Mild chronic ischemic white matter disease. No acute intracranial abnormality seen. Electronically Signed   By:  Lupita Raider, M.D.   On: 04/10/2017 18:00   Ct Chest W Contrast  Result Date: 04/17/2017 CLINICAL DATA:  Sepsis secondary to community acquired pneumonia in a patient with dementia. Acute kidney injury. EXAM: CT CHEST, ABDOMEN, AND PELVIS WITH CONTRAST TECHNIQUE: Multidetector CT imaging  of the chest, abdomen and pelvis was performed following the standard protocol during bolus administration of intravenous contrast. CONTRAST:  100 ml ISOVUE-300 IOPAMIDOL (ISOVUE-300) INJECTION 61% COMPARISON:  PA and lateral chest 04/10/2017 and 06/22/2015. FINDINGS: CT CHEST FINDINGS Cardiovascular: There is cardiomegaly. No pericardial effusion. Extensive calcific aortic and coronary atherosclerosis is identified. The pulmonary outflow trunk and right and left main pulmonary arteries are prominent suggestive of pulmonary arterial hypertension. Mediastinum/Nodes: AP window node on image 18 measures 1.4 cm short axis dimension right precarinal node on image 20 measures 1.6 cm short axis dimension. Finally, right hilar node on image 25 measures 1.1 cm short axis dimension. Lungs/Pleura: Small bilateral pleural effusions are identified. Extensive airspace disease throughout all lobes of both lungs is identified and appears somewhat worst in the upper lobes bilaterally. Some areas of opacities in the upper lobes have a crazy paving type appearance. Musculoskeletal: No acute abnormality. No lytic or sclerotic lesion. Multifocal spondylosis noted. CT ABDOMEN PELVIS FINDINGS Hepatobiliary: The liver is low attenuating consistent with fatty infiltration. No focal lesion. Gallbladder and biliary tree appear normal. Pancreas: Unremarkable. No pancreatic ductal dilatation or surrounding inflammatory changes. Spleen: Normal in size without focal abnormality. Adrenals/Urinary Tract: Small cyst in the lower pole of the left kidney is noted. The kidneys otherwise appear normal. 1 cm nodule in the left adrenal gland cannot be definitively  characterized but is likely a benign adenoma. The right adrenal gland appears normal. Urinary bladder is unremarkable. Stomach/Bowel: Small hiatal hernia is seen. The stomach is otherwise unremarkable. Small and large bowel appear normal. The appendix is not visualized but there is no evidence of appendicitis. Vascular/Lymphatic: Extensive aortoiliac atherosclerosis without aneurysm. No lymphadenopathy. Reproductive: Status post hysterectomy. No adnexal masses. Other: No fluid collection.  Mild body wall edema is noted. Musculoskeletal: No acute or focal bony abnormality. Multilevel lumbar spondylosis is noted. IMPRESSION: Extensive airspace disease throughout the chest could be due to multifocal pneumonia, ARDS or pulmonary edema. Marked cardiomegaly. Small mediastinal lymph nodes are presumably reactive. Small bilateral pleural effusions. Prominence of the pulmonary arteries compatible with pulmonary arterial hypertension. Extensive calcific aortic and coronary atherosclerosis. No acute abnormality abdomen or pelvis. Fatty infiltration of the liver. Electronically Signed   By: Drusilla Kanner M.D.   On: 04/17/2017 00:57   Mr Laqueta Jean ZO Contrast  Result Date: 04/16/2017 CLINICAL DATA:  Initial evaluation for a advanced dementia with increasing confusion and behavioral disturbance. EXAM: MRI HEAD WITHOUT AND WITH CONTRAST TECHNIQUE: Multiplanar, multiecho pulse sequences of the brain and surrounding structures were obtained without and with intravenous contrast. CONTRAST:  12mL MULTIHANCE GADOBENATE DIMEGLUMINE 529 MG/ML IV SOLN COMPARISON:  Comparison made with prior CT from 04/10/2017. FINDINGS: Brain: Study moderately degraded by motion artifact. Diffuse prominence of the CSF containing spaces compatible with generalized cerebral atrophy. Patchy and confluent T2/FLAIR hyperintensity within the periventricular and deep white matter both cerebral hemispheres most consistent with chronic micro vessel ischemic  disease. Overall, changes are moderate nature. Patchy approximately 2 cm acute ischemic infarct within the mid left cerebellar hemisphere (series 4, image 15). No associated mass effect or hemorrhage. No other areas of acute or subacute infarction. Gray-white matter differentiation otherwise maintained. No other encephalomalacia to suggest chronic infarction. No evidence for acute or chronic intracranial hemorrhage. No mass lesion, midline shift or mass effect. Mild ventricular prominence related to global parenchymal volume loss without hydrocephalus. No extra-axial fluid collection. Major dural sinuses are grossly patent. No definite abnormal enhancement, although postcontrast sequence Ing is limited by  extensive motion. Pituitary gland suprasellar region grossly within normal limits. Vascular: Major intracranial vascular flow voids are maintained. Skull and upper cervical spine: Craniocervical junction within normal limits. Visualized upper cervical spine unremarkable. Bone marrow signal intensity within normal limits. No scalp soft tissue abnormality. Sinuses/Orbits: Globes and orbital soft tissues within normal limits. Patient status post lens extraction bilaterally. Scattered mucosal thickening within the left ethmoidal air cells and left maxillary sinus. Superimposed fluid level noted within left maxillary sinus. Paranasal sinuses otherwise clear. Trace opacity noted within the right mastoid air cells. Inner ear structures grossly normal. Other: None. IMPRESSION: 1. 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 2. Moderate cerebral atrophy with chronic microvascular ischemic disease. 3. Acute left maxillary and ethmoidal sinusitis. Electronically Signed   By: Rise Mu M.D.   On: 04/16/2017 19:21   Ct Abdomen Pelvis W Contrast  Result Date: 04/17/2017 CLINICAL DATA:  Sepsis secondary to community acquired pneumonia in a patient with dementia. Acute kidney injury. EXAM:  CT CHEST, ABDOMEN, AND PELVIS WITH CONTRAST TECHNIQUE: Multidetector CT imaging of the chest, abdomen and pelvis was performed following the standard protocol during bolus administration of intravenous contrast. CONTRAST:  100 ml ISOVUE-300 IOPAMIDOL (ISOVUE-300) INJECTION 61% COMPARISON:  PA and lateral chest 04/10/2017 and 06/22/2015. FINDINGS: CT CHEST FINDINGS Cardiovascular: There is cardiomegaly. No pericardial effusion. Extensive calcific aortic and coronary atherosclerosis is identified. The pulmonary outflow trunk and right and left main pulmonary arteries are prominent suggestive of pulmonary arterial hypertension. Mediastinum/Nodes: AP window node on image 18 measures 1.4 cm short axis dimension right precarinal node on image 20 measures 1.6 cm short axis dimension. Finally, right hilar node on image 25 measures 1.1 cm short axis dimension. Lungs/Pleura: Small bilateral pleural effusions are identified. Extensive airspace disease throughout all lobes of both lungs is identified and appears somewhat worst in the upper lobes bilaterally. Some areas of opacities in the upper lobes have a crazy paving type appearance. Musculoskeletal: No acute abnormality. No lytic or sclerotic lesion. Multifocal spondylosis noted. CT ABDOMEN PELVIS FINDINGS Hepatobiliary: The liver is low attenuating consistent with fatty infiltration. No focal lesion. Gallbladder and biliary tree appear normal. Pancreas: Unremarkable. No pancreatic ductal dilatation or surrounding inflammatory changes. Spleen: Normal in size without focal abnormality. Adrenals/Urinary Tract: Small cyst in the lower pole of the left kidney is noted. The kidneys otherwise appear normal. 1 cm nodule in the left adrenal gland cannot be definitively characterized but is likely a benign adenoma. The right adrenal gland appears normal. Urinary bladder is unremarkable. Stomach/Bowel: Small hiatal hernia is seen. The stomach is otherwise unremarkable. Small and  large bowel appear normal. The appendix is not visualized but there is no evidence of appendicitis. Vascular/Lymphatic: Extensive aortoiliac atherosclerosis without aneurysm. No lymphadenopathy. Reproductive: Status post hysterectomy. No adnexal masses. Other: No fluid collection.  Mild body wall edema is noted. Musculoskeletal: No acute or focal bony abnormality. Multilevel lumbar spondylosis is noted. IMPRESSION: Extensive airspace disease throughout the chest could be due to multifocal pneumonia, ARDS or pulmonary edema. Marked cardiomegaly. Small mediastinal lymph nodes are presumably reactive. Small bilateral pleural effusions. Prominence of the pulmonary arteries compatible with pulmonary arterial hypertension. Extensive calcific aortic and coronary atherosclerosis. No acute abnormality abdomen or pelvis. Fatty infiltration of the liver. Electronically Signed   By: Drusilla Kanner M.D.   On: 04/17/2017 00:57       Subjective: Alert, answer yes to questions.   Discharge Exam: Vitals:   04/19/17 2119 04/20/17 0434  BP: 130/69 138/64  Pulse: 91 88  Resp: 18 18  Temp: 98.1 F (36.7 C) 98 F (36.7 C)  SpO2: 93% 95%   Vitals:   04/19/17 0620 04/19/17 0926 04/19/17 2119 04/20/17 0434  BP: 127/71 123/64 130/69 138/64  Pulse: 79 80 91 88  Resp: Temp: 98.1 F (36.7 C)  98.1 F (36.7 C) 98 F (36.7 C)  TempSrc: Axillary  Axillary Axillary  SpO2: 93%  93% 95%  Weight:      Height:        General: Pt is alert, awake, not in acute distress Cardiovascular: RRR, S1/S2 +, no rubs, no gallops Respiratory: CTA bilaterally, no wheezing, no rhonchi Abdominal: Soft, NT, ND, bowel sounds + Extremities: no edema, no cyanosis    The results of significant diagnostics from this hospitalization (including imaging, microbiology, ancillary and laboratory) are listed below for reference.     Microbiology: Recent Results (from the past 240 hour(s))  MRSA PCR Screening     Status:  None   Collection Time: 04/11/17 10:30 PM  Result Value Ref Range Status   MRSA by PCR NEGATIVE NEGATIVE Final    Comment:        The GeneXpert MRSA Assay (FDA approved for NASAL specimens only), is one component of a comprehensive MRSA colonization surveillance program. It is not intended to diagnose MRSA infection nor to guide or monitor treatment for MRSA infections.   Culture, blood (routine x 2)     Status: None (Preliminary result)   Collection Time: 04/17/17 10:46 AM  Result Value Ref Range Status   Specimen Description BLOOD RIGHT ARM  Final   Special Requests   Final    BOTTLES DRAWN AEROBIC ONLY Blood Culture results may not be optimal due to an inadequate volume of blood received in culture bottles   Culture   Final    NO GROWTH 3 DAYS Performed at [facility], [address], [place], [place] [address]    Report Status PENDING  Incomplete  Culture, blood (routine x 2)     Status: None (Preliminary result)   Collection Time: 04/17/17 11:01 AM  Result Value Ref Range Status   Specimen Description BLOOD RIGHT ANTECUBITAL  Final   Special Requests IN PEDIATRIC BOTTLE Blood Culture adequate volume  Final   Culture   Final    NO GROWTH 3 DAYS Performed at [facility], [address] [place], [place] [address]    Report Status PENDING  Incomplete     Labs: BNP (last 3 results)  Recent Labs  04/17/17 1103  BNP 442.4*   Basic Metabolic Panel:  Recent Labs Lab 04/14/17 0353 04/15/17 0757 04/17/17 0600 04/19/17 0539  NA 140 140 136 141  K 3.6 3.8 3.6 3.6  CL 111 110 102 109  CO2 GLUCOSE 104* 95 94 97  BUN CREATININE 0.66 0.61 0.56 0.79  CALCIUM 8.3* 8.4* 8.8* 8.6*   Liver Function Tests: No results for input(s): AST, ALT, ALKPHOS, BILITOT, PROT, ALBUMIN in the last 168 hours. No results for input(s): LIPASE, AMYLASE in the last 168 hours. No results for input(s): AMMONIA in the last 168 hours. CBC:  Recent  Labs Lab 04/14/17 0353 04/15/17 0757 04/17/17 0600  WBC 9.1 8.3 8.8  HGB 14.5 13.2 14.2  HCT 42.8 39.6 42.3  MCV 92.8 93.2 91.6  PLT 112* 110* 150   Cardiac Enzymes: No results for input(s): CKTOTAL,  CKMB, CKMBINDEX, TROPONINI in the last 168 hours. BNP: Invalid input(s): POCBNP CBG: No results for input(s): GLUCAP in the last 168 hours. D-Dimer No results for input(s): DDIMER in the last 72 hours. Hgb A1c  Recent Labs  04/18/17 0504  HGBA1C 5.3   Lipid Profile  Recent Labs  04/18/17 0504  CHOL 157  HDL 27*  LDLCALC 110*  TRIG 98  CHOLHDL 5.8   Thyroid function studies No results for input(s): TSH, T4TOTAL, T3FREE, THYROIDAB in the last 72 hours.  Invalid input(s): FREET3 Anemia work up No results for input(s): VITAMINB12, FOLATE, FERRITIN, TIBC, IRON, RETICCTPCT in the last 72 hours. Urinalysis    Component Value Date/Time   COLORURINE YELLOW 04/10/2017 1858   APPEARANCEUR CLEAR 04/10/2017 1858   LABSPEC 1.019 04/10/2017 1858   PHURINE 6.0 04/10/2017 1858   GLUCOSEU NEGATIVE 04/10/2017 1858   HGBUR NEGATIVE 04/10/2017 1858   BILIRUBINUR NEGATIVE 04/10/2017 1858   KETONESUR 5 (A) 04/10/2017 1858   PROTEINUR NEGATIVE 04/10/2017 1858   UROBILINOGEN 2.0 (H) 06/10/2015 1724   NITRITE NEGATIVE 04/10/2017 1858   LEUKOCYTESUR TRACE (A) 04/10/2017 1858   Sepsis Labs Invalid input(s): PROCALCITONIN,  WBC,  LACTICIDVEN Microbiology Recent Results (from the past 240 hour(s))  MRSA PCR Screening     Status: None   Collection Time: 04/11/17 10:30 PM  Result Value Ref Range Status   MRSA by PCR NEGATIVE NEGATIVE Final    Comment:        The GeneXpert MRSA Assay (FDA approved for NASAL specimens only), is one component of a comprehensive MRSA colonization surveillance program. It is not intended to diagnose MRSA infection nor to guide or monitor treatment for MRSA infections.   Culture, blood (routine x 2)     Status: None (Preliminary result)    Collection Time: 04/17/17 10:46 AM  Result Value Ref Range Status   Specimen Description BLOOD RIGHT ARM  Final   Special Requests   Final    BOTTLES DRAWN AEROBIC ONLY Blood Culture results may not be optimal due to an inadequate volume of blood received in culture bottles   Culture   Final    NO GROWTH 3 DAYS Performed at [facility], [address], [place], [place] [address]    Report Status PENDING  Incomplete  Culture, blood (routine x 2)     Status: None (Preliminary result)   Collection Time: 04/17/17 11:01 AM  Result Value Ref Range Status   Specimen Description BLOOD RIGHT ANTECUBITAL  Final   Special Requests IN PEDIATRIC BOTTLE Blood Culture adequate volume  Final   Culture   Final    NO GROWTH 3 DAYS Performed at [facility], [address], [place], [place] [address]    Report Status PENDING  Incomplete     Time coordinating discharge: Over 30 minutes  SIGNED:   Alba Cory, MD  [facility] 04/20/2017, 12:45 PM Pager   If 7PM-7AM, please contact night-coverage [email or web address] Password [email or web address]

## 2017-04-21 IMAGING — CT CT HEAD W/O CM
2 series · 17 of 30 positions shown, 20 images · non-contrast
Comparison: Prior study at [facility] on 02/22/2003

CLINICAL DATA: Change in mental status.  History of dementia.

EXAM:
CT HEAD WITHOUT CONTRAST
TECHNIQUE: Contiguous axial images were obtained from the base of the skull
through the vertex without intravenous contrast.

[Series 2: head w/o · axial · non-contrast · 0.45mm/px · z∈[-108,+12]mm · 9 of 31 slices shown, 12 images]
[im 4/31  brain]
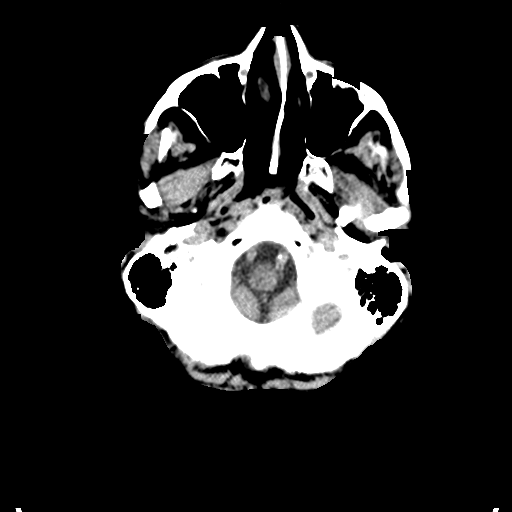
[im 4/31  bone]
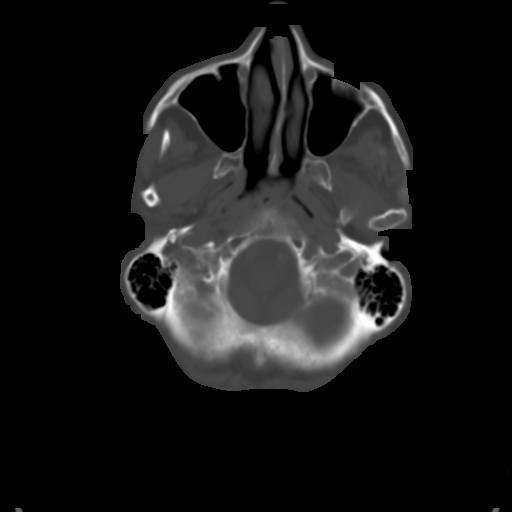
[im 7/31  brain]
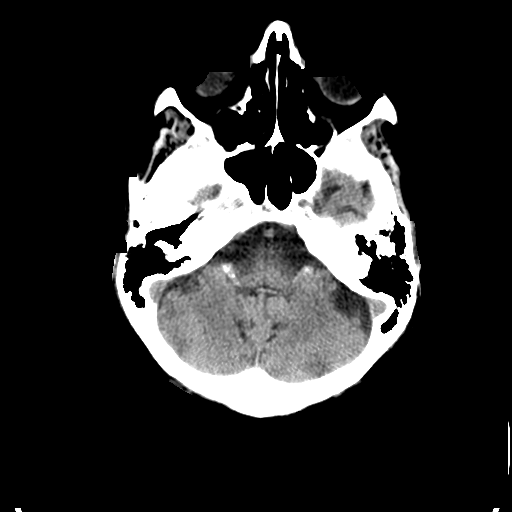
[im 10/31  brain]
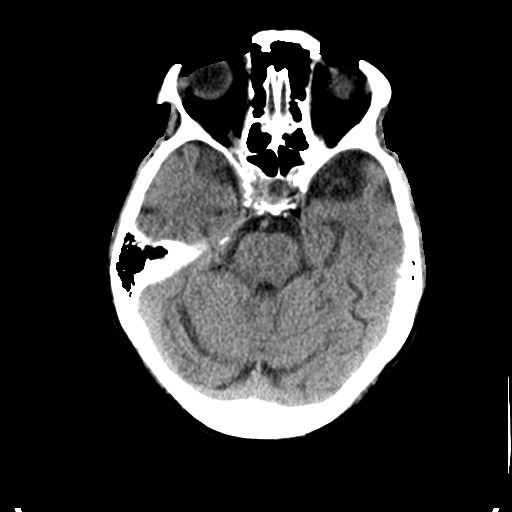
[im 13/31  brain]
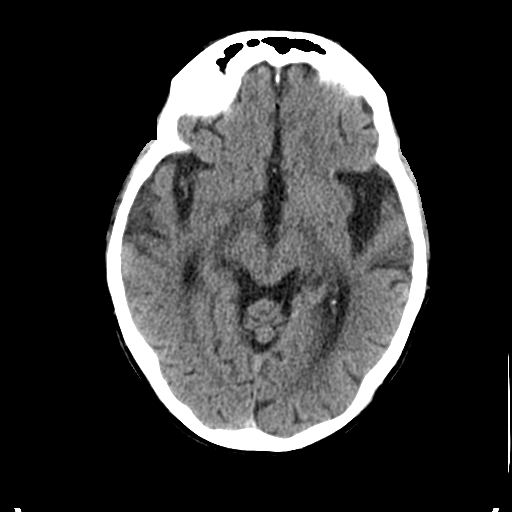
[im 16/31  brain]
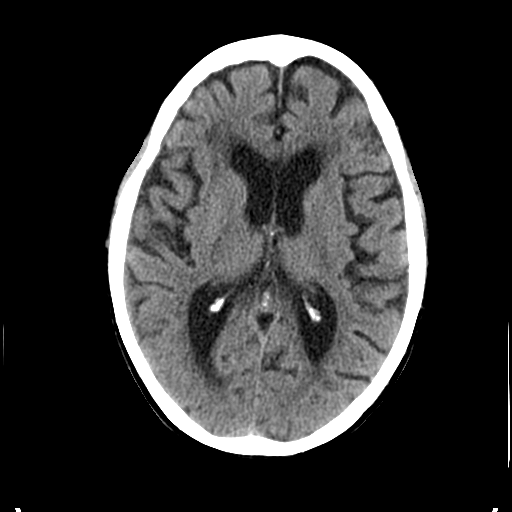
[im 16/31  bone]
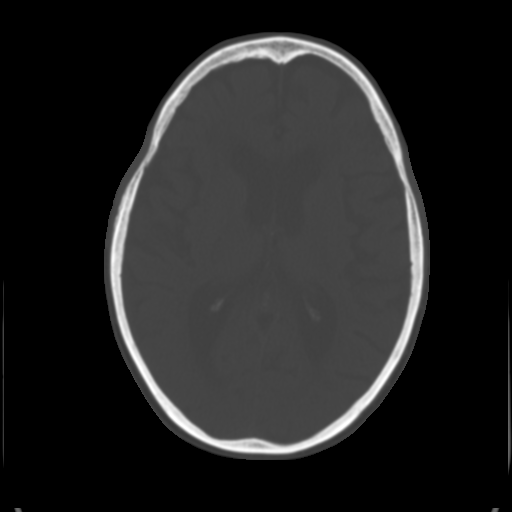
[im 19/31  brain]
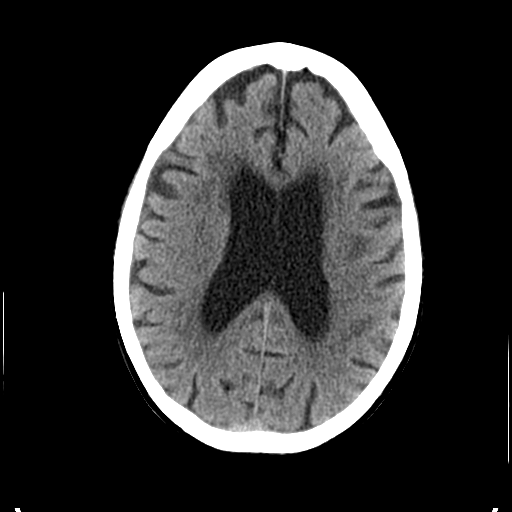
[im 22/31  brain]
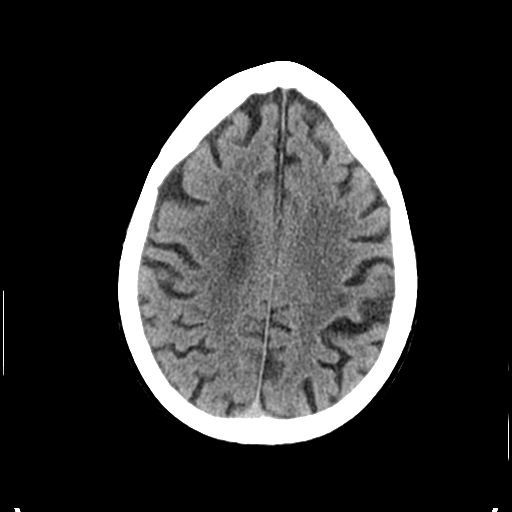
[im 25/31  brain]
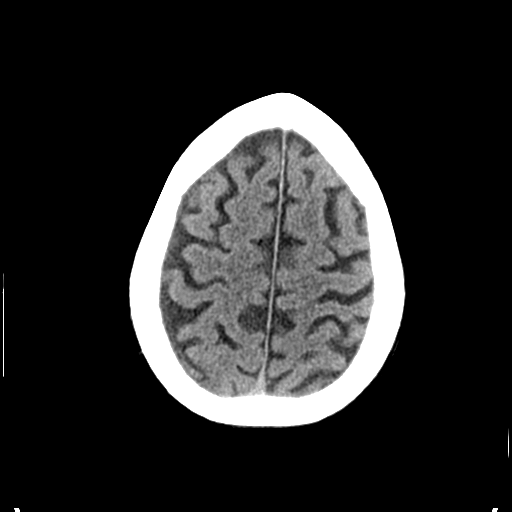
[im 28/31  brain]
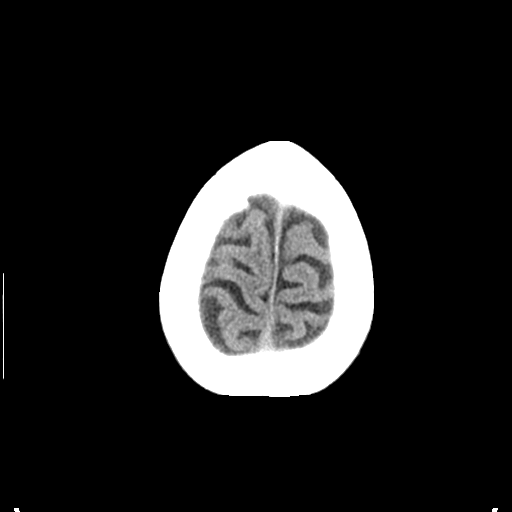
[im 28/31  bone]
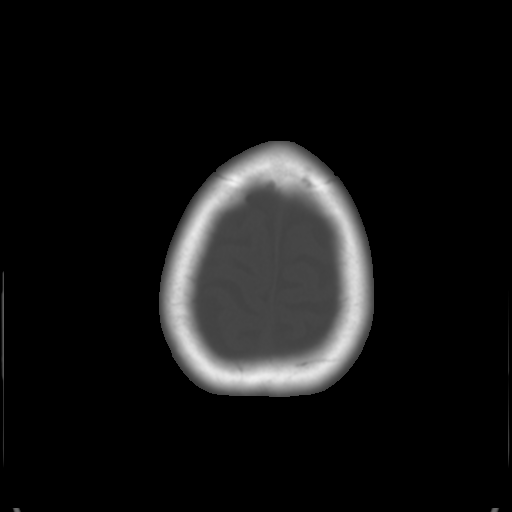

[Series 3: bone windows · axial · 0.45mm/px · z∈[-108,+12]mm · 8 of 52 slices shown]
[im 6/52  bone]
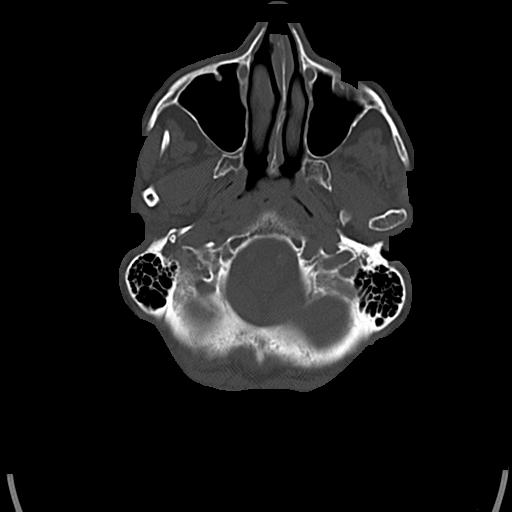
[im 12/52  bone]
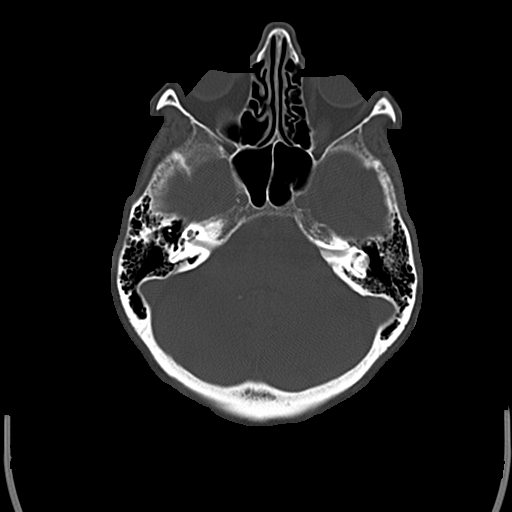
[im 18/52  bone]
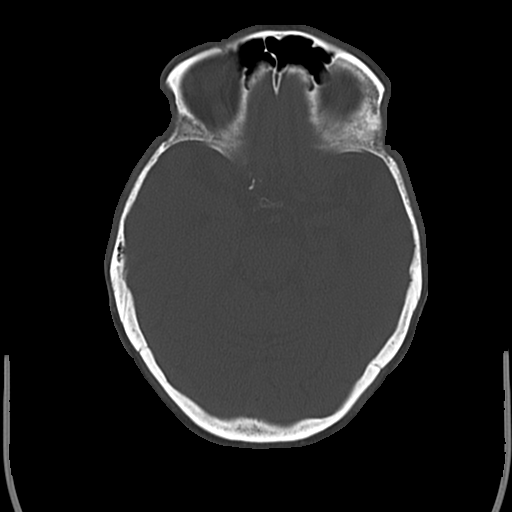
[im 23/52  bone]
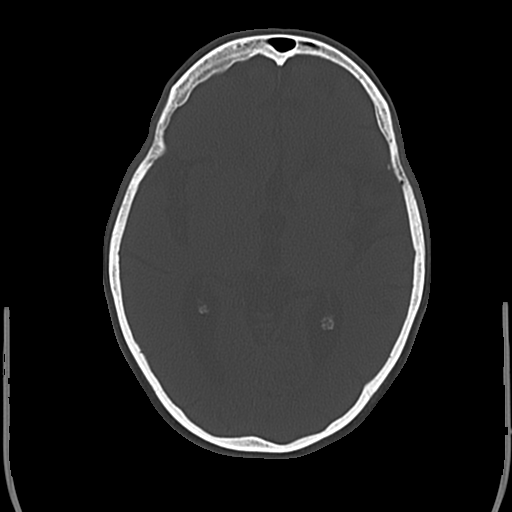
[im 29/52  bone]
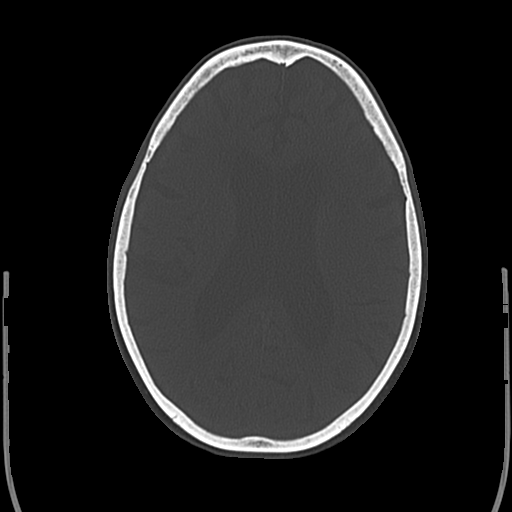
[im 35/52  bone]
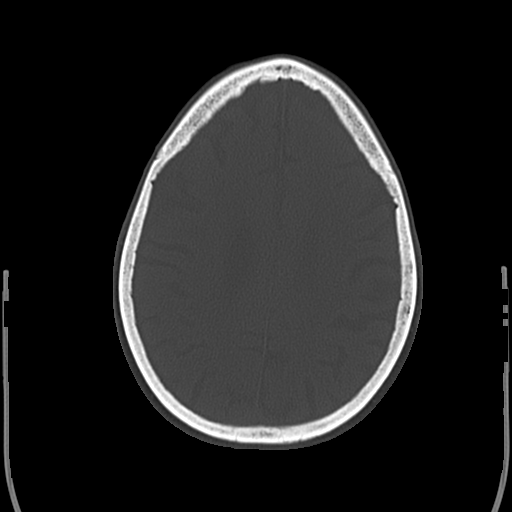
[im 40/52  bone]
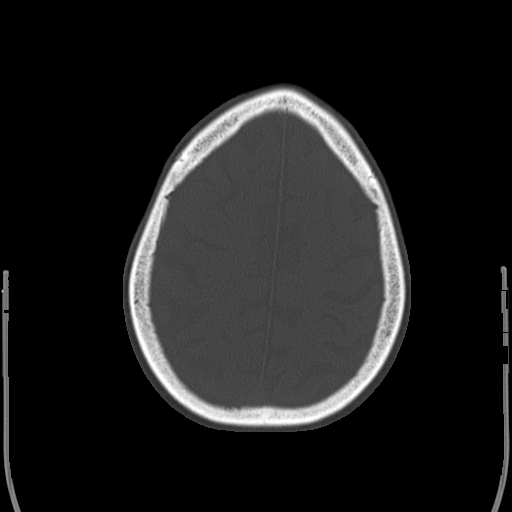
[im 46/52  bone]
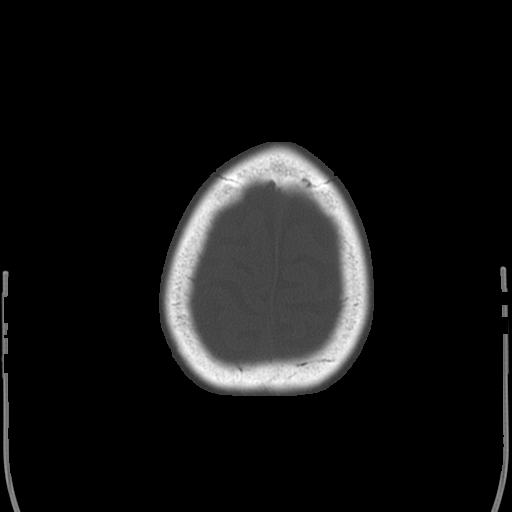

[17 of 30 positions shown; findings below may reference images not displayed]

FINDINGS: Significant progression of generalized atrophy and moderately
advanced small vessel disease in the periventricular white matter
since the prior study. The brain demonstrates no evidence of
hemorrhage, infarction, edema, mass effect, extra-axial fluid
collection, hydrocephalus or mass lesion. The skull is unremarkable.
IMPRESSION: Significant progression of atrophy and small vessel disease since
the prior study in 3553. No acute findings.

## 2017-04-21 MED ORDER — DEXTROSE-NACL 5-0.45 % IV SOLN
INTRAVENOUS | Status: DC
  Administered 2017-04-21: 11:00:00 via INTRAVENOUS

## 2017-04-21 MED ORDER — HALOPERIDOL 1 MG PO TABS
1.0000 mg | ORAL_TABLET | Freq: Four times a day (QID) | ORAL | Status: DC | PRN
  Administered 2017-04-21: 1 mg via ORAL
  Filled 2017-04-21: qty 1

## 2017-04-21 NOTE — Progress Notes (Signed)
CSW following for DC planning. Spoke with RN and attending. Pt stable medically for DC. PASSR in manual review- CSW faxed requested information to PASSR and awaiting reply. Will need PASSR approved prior to being able to admit to SNF. Pt had sitter over night per RN DC'd 2am. Will need to have sitter DC'd x24 hours prior to admission to SNF.  Ilean Skill, MSW, LCSW Clinical Social Work 04/21/2017 [phone]

## 2017-04-21 NOTE — Progress Notes (Signed)
The PCP on call was notified for a sitter order. Awaiting a return call/orders placed from the PCP on call.

## 2017-04-21 NOTE — Progress Notes (Signed)
PROGRESS NOTE  Sheryl Horn ZOX:[id number] DOB: [birth year] DOA: 04/10/2017 PCP: System, Provider Not In   LOS: 9 days   Brief Narrative / Interim history: Sheryl Horn is a 71 y.o. female with history of advanced dementia was brought to the ER with increasing confusion and behavioral disturbance. In the ER patient was seen by behavioral health team and advised inpatient psychiatric admission and patient was accepted at [facility]. Patient was waiting for transfer when patient's heart rate increased and was found to be in A. fib with RVR. Patient has advanced dementia and does not provide any history. Patient has a known history of A. fib and is on metoprolol and Cardizem for rate control and on Apixaban.  Clinically she improved, she was transitioned off of Cardizem infusion back on her oral regimen and was transferred to the floor on 9/11.  Assessment & Plan: Active Problems:   HTN (hypertension)   Dementia with behavioral disturbance   Atrial fibrillation with rapid ventricular response (HCC)   Thoracic aorta atherosclerosis (HCC)  Acute left cerebellar stoke;  PT, OT, Speech evaluation.  Patient will remain at [facility]  Unable to tolerate MRA. CTA ordered.  Started  Zetia, patient with allergy to statin Already on eliquis Carotid doppler negative.   Acute Hypoxic Respiratory Failure;  Related to PNA.  Chest x ray with PNA vs pulmonary edema.  Having fevers. Antibiotics change to zosyn and azithromycin.  Change IV lasix to oral.  Respiratory status stable.  Stop azithro.  zosyn for 4 days.  A. fib with RVR -Patient was initiated on Cardizem infusion with improvement in her rates.  She was converted to p.o. metoprolol as well as carvedilol 9/6, however overnight towards 9/7 her rates have gone back up into the 150s.  She was started back again on Cardizem IV and digoxin, and transitioned back to p.o. 9/10 -Consulted cardiology, appreciate input, signed off from  9/10 -Continue anticoagulation with apixaban -Rate controlled. Continue with metoprolol and Cardizem.   Sepsis due to pneumonia -Meet sepsis criteria with fever, elevated heart rate, and a source -Started on ceftriaxone and azithromycin in the emergency room. Received 3 days.  -continue to spike fever, ct chest showed multifocal pneumonia.  -Started IV zosyn 9-12, restarted azithromycin 9-2. plan to discharge patient on clindamycin  -blood culture no growth to date.    Acute kidney injury -Likely due to dehydration, elevated heart rates as well as sepsis -Creatinine has now normalized  Dementia with behavioral disturbances Per  patient's brother [name],  patient has been diagnosed with Alzheimer's dementia 4-5 years ago, however he feels like her dementia was stable up until 2 months ago when she had an abrupt decline in her mental status, she became more aggressive and intermittently nonverbal / catatonic.  He feels like these changes are abrupt and he believes that they are medication related despite the fact that patient has been on the same regimen for few years.  She had an episode when she was hospitalized at [name] few months ago and she was discharged off of her Depakote, and feels like her behavior changed then, however Depakote was later added back on in her memory care unit.  I called and discussed with the nurse at [facility] home and she felt like her recent changes are not that abrupt and in fact were somewhat of a gradual progression. -Given concern for acute changes about 2 months ago, an MRI of the brain was obtain  to rule out  any organic causes. MRI positive for stroke.  -Psych consulted also.  Patient will be discharge on Depakote. Which could be increase as needed for mood.  Continue with ativan.  namenda dose reduce to once a day.  Started low dose Aricept.    Hypertension -Blood pressure improving and now on the hypertensive side  Mild  thrombocytopenia -Stable, follow CBC, platelets 110, slightly worse likely in the setting of sepsis -Repeat CBC in the morning    DVT prophylaxis: Apixaban Code Status: Full code Family Communication: brother updated 9-13 Disposition Plan: SNF in 24 hours.  Consultants:   Cardiology  Procedures:   None   Antimicrobials:  Ceftriaxone 9/6 >>9-12  Azithromycin 9/6 >>  Subjective: No complaints, answer yes. Does not follows command.   Objective: Vitals:   04/20/17 2031 04/20/17 2210 04/21/17 0546 04/21/17 0700  BP: (!) 146/64 126/60 (!) 175/81 (!) 146/67  Pulse: 98  93   Resp: 17  18   Temp: 98.1 F (36.7 C) 99 F (37.2 C) 98.3 F (36.8 C)   TempSrc: Axillary Oral Oral   SpO2: 92% 93% 93%   Weight:      Height:        Intake/Output Summary (Last 24 hours) at 04/21/17 1412 Last data filed at 04/21/17 1131  Gross per 24 hour  Intake              570 ml  Output                0 ml  Net              570 ml   Filed Weights   04/11/17 1913 04/12/17 0701  Weight: 62.2 kg (137 lb 3.2 oz) 62.2 kg (137 lb 2 oz)    Examination:  Vitals:   04/20/17 2031 04/20/17 2210 04/21/17 0546 04/21/17 0700  BP: (!) 146/64 126/60 (!) 175/81 (!) 146/67  Pulse: 98  93   Resp: 17  18   Temp: 98.1 F (36.7 C) 99 F (37.2 C) 98.3 F (36.8 C)   TempSrc: Axillary Oral Oral   SpO2: 92% 93% 93%   Weight:      Height:       Constitutional: NAD ENMT: MM moist  Respiratory: CTA Cardiovascular: S 1, S 2 , IRR Abdomen: BS present, soft, nt Neurologic: moves extremities passively, does not follows command.    Data Reviewed: I have independently reviewed following labs and imaging studies   CBC:  Recent Labs Lab 04/15/17 0757 04/17/17 0600  WBC 8.3 8.8  HGB 13.2 14.2  HCT 39.6 42.3  MCV 93.2 91.6  PLT 110* 150   Basic Metabolic Panel:  Recent Labs Lab 04/15/17 0757 04/17/17 0600 04/19/17 0539  NA 140 136 141  K 3.8 3.6 3.6  CL 110 102 109  CO2 GLUCOSE 95 94 97  BUN CREATININE 0.61 0.56 0.79  CALCIUM 8.4* 8.8* 8.6*   GFR: Estimated Creatinine Clearance: 58.6 mL/min (by C-G formula based on SCr of 0.79 mg/dL). Liver Function Tests: No results for input(s): AST, ALT, ALKPHOS, BILITOT, PROT, ALBUMIN in the last 168 hours. No results for input(s): LIPASE, AMYLASE in the last 168 hours. No results for input(s): AMMONIA in the last 168 hours. Coagulation Profile: No results for input(s): INR, PROTIME in the last 168 hours. Cardiac Enzymes: No results for input(s): CKTOTAL, CKMB, CKMBINDEX, TROPONINI in the last 168 hours. BNP (last 3 results) No results  for input(s): PROBNP in the last 8760 hours. HbA1C: No results for input(s): HGBA1C in the last 72 hours. CBG: No results for input(s): GLUCAP in the last 168 hours. Lipid Profile: No results for input(s): CHOL, HDL, LDLCALC, TRIG, CHOLHDL, LDLDIRECT in the last 72 hours. Thyroid Function Tests: No results for input(s): TSH, T4TOTAL, FREET4, T3FREE, THYROIDAB in the last 72 hours. Anemia Panel: No results for input(s): VITAMINB12, FOLATE, FERRITIN, TIBC, IRON, RETICCTPCT in the last 72 hours. Urine analysis:    Component Value Date/Time   COLORURINE YELLOW 04/10/2017 1858   APPEARANCEUR CLEAR 04/10/2017 1858   LABSPEC 1.019 04/10/2017 1858   PHURINE 6.0 04/10/2017 1858   GLUCOSEU NEGATIVE 04/10/2017 1858   HGBUR NEGATIVE 04/10/2017 1858   BILIRUBINUR NEGATIVE 04/10/2017 1858   KETONESUR 5 (A) 04/10/2017 1858   PROTEINUR NEGATIVE 04/10/2017 1858   UROBILINOGEN 2.0 (H) 06/10/2015 1724   NITRITE NEGATIVE 04/10/2017 1858   LEUKOCYTESUR TRACE (A) 04/10/2017 1858   Sepsis Labs: Invalid input(s): PROCALCITONIN, LACTICIDVEN  Recent Results (from the past 240 hour(s))  MRSA PCR Screening     Status: None   Collection Time: 04/11/17 10:30 PM  Result Value Ref Range Status   MRSA by PCR NEGATIVE NEGATIVE Final    Comment:        The GeneXpert MRSA Assay  (FDA approved for NASAL specimens only), is one component of a comprehensive MRSA colonization surveillance program. It is not intended to diagnose MRSA infection nor to guide or monitor treatment for MRSA infections.   Culture, blood (routine x 2)     Status: None (Preliminary result)   Collection Time: 04/17/17 10:46 AM  Result Value Ref Range Status   Specimen Description BLOOD RIGHT ARM  Final   Special Requests   Final    BOTTLES DRAWN AEROBIC ONLY Blood Culture results may not be optimal due to an inadequate volume of blood received in culture bottles   Culture   Final    NO GROWTH 4 DAYS Performed at [facility], [address] [place], [place] [address]    Report Status PENDING  Incomplete  Culture, blood (routine x 2)     Status: None (Preliminary result)   Collection Time: 04/17/17 11:01 AM  Result Value Ref Range Status   Specimen Description BLOOD RIGHT ANTECUBITAL  Final   Special Requests IN PEDIATRIC BOTTLE Blood Culture adequate volume  Final   Culture   Final    NO GROWTH 4 DAYS Performed at [facility], [address], [place], [place] [address]    Report Status PENDING  Incomplete      Radiology Studies: Ct Angio Head W Or Wo Contrast  Result Date: 04/19/2017 CLINICAL DATA:  Acute left cerebellar infarct on MRI. History of dementia. EXAM: CT ANGIOGRAPHY HEAD TECHNIQUE: Multidetector CT imaging of the head was performed using the standard protocol during bolus administration of intravenous contrast. Multiplanar CT image reconstructions and MIPs were obtained to evaluate the vascular anatomy. CONTRAST:  50 mL Isovue 370 COMPARISON:  Brain MRI 04/16/2017.  No prior angiographic imaging. FINDINGS: CT HEAD Brain: 1.8 cm low-density focus posteriorly in the left cerebellum corresponds to the acute infarct demonstrated on recent MRI. There is no evidence of significant infarct extension or associated hemorrhage. No mass, midline shift, or extra-axial  fluid collection is seen. There is moderate cerebral atrophy. Patchy cerebral white matter hypodensities are nonspecific but compatible with moderate chronic small vessel ischemic disease. A punctate calcification is noted in the  left pons. Vascular: Calcified atherosclerosis at the skullbase. Skull: No fracture focal osseous lesion. Sinuses: Trace left maxillary sinus fluid.  Clear mastoid air cells. Orbits: Lateral cataract extraction. CTA HEAD Anterior circulation: The internal carotid arteries are patent from skullbase to carotid termini. There is moderate siphon atherosclerosis resulting in up to mild cavernous and supraclinoid stenosis bilaterally. ACAs and MCAs are patent with mild branch vessel irregularity but no evidence of proximal branch occlusion or significant proximal stenosis. No aneurysm. Posterior circulation: The visualized distal vertebral arteries are patent to the basilar and codominant. Left V4 segment atherosclerosis results in up to mild stenosis. There is milder right V4 segment atherosclerosis without significant stenosis. Patent PICA and SCA origins are visualized bilaterally. AICAs are not identified. The basilar artery is widely patent. There is a patent right posterior communicating artery. The PCAs are patent with mild branch vessel irregularity bilaterally as well as a mild focal proximal P2 stenosis on the right. No aneurysm. Venous sinuses: Patent. Anatomic variants: None. Delayed phase: No abnormal enhancement. IMPRESSION: 1. Intracranial atherosclerosis without large vessel occlusion or flow limiting proximal stenosis. 2. Mild left V4, right P2, and bilateral ICA stenoses. 3. Small left cerebellar infarct, unchanged in size from the prior MRI. Electronically Signed   By: Sebastian Ache M.D.   On: 04/19/2017 15:04     Scheduled Meds: . apixaban  5 mg Oral BID  . digoxin  0.25 mg Intravenous Daily  . diltiazem  90 mg Oral Q8H  . divalproex  250 mg Oral TID  . donepezil  5 mg  Oral QHS  . ezetimibe  10 mg Oral Daily  . famotidine  20 mg Oral Daily  . feeding supplement (ENSURE ENLIVE)  237 mL Oral BID BM  . furosemide  20 mg Oral Daily  . Influenza vac split quadrivalent PF  0.5 mL Intramuscular Tomorrow-1000  . LORazepam  0.5 mg Oral Q8H  . mouth rinse  15 mL Mouth Rinse BID  . memantine  10 mg Oral Daily  . metoprolol succinate  100 mg Oral Daily  . potassium chloride  20 mEq Oral Daily  . RESOURCE THICKENUP CLEAR  1 g Oral TID WC & HS  . traZODone  50 mg Oral QHS   Continuous Infusions: . dextrose 5 % and 0.45% NaCl 50 mL/hr at 04/21/17 1100  . piperacillin-tazobactam (ZOSYN)  IV 3.375 g (04/21/17 1253)    Hartley Barefoot, MD [facility] Pager [phone]  If 7PM-7AM, please contact night-coverage [email or web address] Password [email or web address] 04/21/2017, 2:12 PM

## 2017-04-21 NOTE — Progress Notes (Signed)
Pt with increasing aggitation and aggression tonight.  Pt attempting oob frequently, yelling, grabbing/hitting.   All comfort measure addressed without improvement.  Pt confused and reorientation efforts fail.  Haldol given at 2015 with some improvement but then by 2130 pt behaviors return and requires a sitter for safety.  MD and [facility] notified and sitter ordered and in at 2200.  Pt's behavior improved and sitter was reassigned by [facility] by 0100.  Pt awaken by 0200 with escalating behavior and was given Haldol with improvement.  Will continue to monitor.

## 2017-04-22 LAB — CULTURE, BLOOD (ROUTINE X 2)
CULTURE: NO GROWTH
CULTURE: NO GROWTH
Special Requests: ADEQUATE

## 2017-04-22 MED ORDER — DIGOXIN 125 MCG PO TABS
0.2500 mg | ORAL_TABLET | Freq: Every day | ORAL | Status: DC
  Administered 2017-04-22: 0.25 mg via ORAL
  Filled 2017-04-22: qty 2

## 2017-04-22 MED ORDER — DIGOXIN 250 MCG PO TABS: 0.2500 mg | ORAL_TABLET | Freq: Every day | ORAL | 0 refills | Status: AC

## 2017-04-22 MED ORDER — HALOPERIDOL 1 MG PO TABS: 1.0000 mg | ORAL_TABLET | Freq: Four times a day (QID) | ORAL | 0 refills | Status: AC | PRN

## 2017-04-22 MED ORDER — CLINDAMYCIN HCL 300 MG PO CAPS: 300.0000 mg | ORAL_CAPSULE | Freq: Three times a day (TID) | ORAL | 0 refills | Status: AC

## 2017-04-22 NOTE — Discharge Summary (Signed)
Physician Discharge Summary  Sheryl Horn WUJ:[id number] DOB: 01-02-1946 DOA: 04/10/2017  PCP: System, Provider Not In  Admit date: 04/10/2017 Discharge date: 04/22/2017  Admitted From: Memory care unit.  Disposition: SNF  Recommendations for Outpatient Follow-up:  1. Follow up with PCP in 1-2 weeks 2. Please obtain BMP/CBC in one week 3. Monitor volume status.     Discharge Condition: Stable.  CODE STATUS: Full code.  Diet recommendation: dysphagia diet 1  Brief/Interim Summary: Sheryl Horn a 71 y.o.femalewith history of advanced dementia was brought to the ER with increasing confusion and behavioral disturbance. In the ER patient was seen by behavioral health team and advised inpatient psychiatric admission and patient was accepted at [facility]. Patient was waiting for transfer when patient's heart rate increased and was found to be in A. fib with RVR. Patient has advanced dementia and does not provide any history. Patient has a known history of A. fib and is on metoprolol and Cardizem for rate control and on Apixaban.  Clinically she improved, she was transitioned off of Cardizem infusion back on her oral regimen and was transferred to the floor on 9/11.  Assessment & Plan: Active Problems:   HTN (hypertension)   Dementia with behavioral disturbance   Atrial fibrillation with rapid ventricular response (HCC)   Thoracic aorta atherosclerosis (HCC)  Acute left cerebellar stoke;  PT, OT, Speech evaluation.  Patient will remain at [facility].  Unable to tolerate MRA. CTA ordered.  Started  Zetia, patient with allergy to statin Already on eliquis Carotid doppler negative.  CTA no significant large vessel stenosis.   Acute Hypoxic Respiratory Failure;  Related to PNA.  Chest x ray with PNA vs pulmonary edema.  Having fevers. Antibiotics change to zosyn and azithromycin.  Change IV lasix to oral.  Respiratory status stable.  Stop azithro. Treated with Zosyn for  6 days. Needs one more day treatment. She will be discharge with one more day of azithromycin.   A. fib with RVR -Patient was initiated on Cardizem infusion with improvement in her rates.  She was converted to p.o. metoprolol as well as carvedilol 9/6, however overnight towards 9/7 her rates have gone back up into the 150s.  She was started back again on Cardizem IV and digoxin, and transitioned back to p.o. 9/10 -Consulted cardiology, appreciate input, signed off from 9/10 -Continue anticoagulation with apixaban -Rate controlled. Continue with metoprolol and Cardizem. She was started on digoxin.   Sepsis due to pneumonia -Meet sepsis criteria with fever, elevated heart rate, and a source -Started on ceftriaxone and azithromycin in the emergency room. Received 3 days.  -continue to spike fever, ct chest showed multifocal pneumonia.  -Started IV zosyn 9-12, restarted azithromycin 9-2. plan to discharge patient on clindamycin for 1 more days.  -blood culture no growth to date.    Acute kidney injury -Likely due to dehydration, elevated heart rates as well as sepsis -Creatinine has now normalized  Dementia with behavioral disturbances Per  patient's brother [name],  patient has been diagnosed with Alzheimer's dementia 4-5 years ago, however he feels like her dementia was stable up until 2 months ago when she had an abrupt decline in her mental status, she became more aggressive and intermittently nonverbal / catatonic.  He feels like these changes are abrupt and he believes that they are medication related despite the fact that patient has been on the same regimen for few years.  She had an episode when she was hospitalized at [facility]  few months ago and she was discharged off of her Depakote, and feels like her behavior changed then, however Depakote was later added back on in her memory care unit.  I called and discussed with the nurse at [facility] home and she felt like her  recent changes are not that abrupt and in fact were somewhat of a gradual progression. -Given concern for acute changes about 2 months ago, an MRI of the brain was obtain  to rule out any organic causes. MRI positive for stroke.  -Psych consulted also.  Patient will be discharge on Depakote. Which could be increase as needed for mood.  Continue with ativan.  namenda dose reduce to once a day.  Started low dose Aricept.  per psych could use haldol PRN if needed. Patient has required haldol one time in the hospital;   Hypertension -Blood pressure improving and now on the hypertensive side  Mild thrombocytopenia -Stable, follow CBC, platelets 110, slightly worse likely in the setting of sepsis -   Discharge Diagnoses:  Active Problems:   HTN (hypertension)   Dementia with behavioral disturbance   Atrial fibrillation with rapid ventricular response (HCC)   Thoracic aorta atherosclerosis [facility])    Discharge Instructions  Discharge Instructions    Diet - low sodium heart healthy    Complete by:  As directed    Increase activity slowly    Complete by:  As directed    Increase activity slowly    Complete by:  As directed      Allergies as of 04/22/2017      Reactions   Bacitracin Other (See Comments)   Reaction unknown per [facility]   Other Other (See Comments)   HMG-COA reductase inhibitors - reaction unknown per [facility]   Penicillins Other (See Comments)   Reaction unknown per [facility]   Statins Other (See Comments)   Reaction unknown per [facility]      Medication List    STOP taking these medications   busPIRone 15 MG tablet Commonly known as:  BUSPAR   diltiazem 300 MG 24 hr capsule Commonly known as:  TIAZAC   metoprolol tartrate 25 MG tablet Commonly known as:  LOPRESSOR     TAKE these medications   alum & mag hydroxide-simeth 200-200-20 MG/5ML suspension Commonly known as:  MAALOX/MYLANTA Take 30 mLs by mouth every 6 (six) hours as needed for indigestion or heartburn  (dyspepsia).   clindamycin 300 MG capsule Commonly known as:  CLEOCIN Take 1 capsule (300 mg total) by mouth 3 (three) times daily. What changed:  when to take this   digoxin 0.25 MG tablet Commonly known as:  LANOXIN Take 1 tablet (0.25 mg total) by mouth daily.   diltiazem 90 MG tablet Commonly known as:  CARDIZEM Take 1 tablet (90 mg total) by mouth every 8 (eight) hours.   divalproex 125 MG capsule Commonly known as:  DEPAKOTE SPRINKLE Take 250 mg by mouth 3 (three) times daily.   donepezil 5 MG tablet Commonly known as:  ARICEPT Take 1 tablet (5 mg total) by mouth at bedtime.   ELIQUIS 5 MG Tabs tablet Generic drug:  apixaban Take 5 mg by mouth 2 (two) times daily.   ezetimibe 10 MG tablet Commonly known as:  ZETIA Take 1 tablet (10 mg total) by mouth daily.   Fish Oil 1000 MG Caps Take 2 capsules by mouth 2 (two) times daily.   furosemide 20 MG tablet Commonly known as:  LASIX Take 1 tablet (20 mg  total) by mouth daily. What changed:  medication strength  how much to take   haloperidol 1 MG tablet Commonly known as:  HALDOL Take 1 tablet (1 mg total) by mouth every 6 (six) hours as needed for agitation.   LORazepam 0.5 MG tablet Commonly known as:  ATIVAN Take 1 tablet (0.5 mg total) by mouth every 8 (eight) hours.   Melatonin 10 MG Caps Take 1 capsule by mouth at bedtime.   memantine 10 MG tablet Commonly known as:  NAMENDA Take 1 tablet (10 mg total) by mouth daily. What changed:  when to take this  Another medication with the same name was removed. Continue taking this medication, and follow the directions you see here.   metoprolol succinate 100 MG 24 hr tablet Commonly known as:  TOPROL-XL Take 100 mg by mouth daily. Take with or immediately following a meal.   NUTRA/SHAKE FREE PO Take 237 mLs by mouth 2 (two) times daily.   nystatin powder Commonly known as:  MYCOSTATIN/NYSTOP Apply 1 Bottle topically 2 (two) times daily.    potassium chloride SA 20 MEQ tablet Commonly known as:  K-DUR,KLOR-CON Take 20 mEq by mouth daily.   ranitidine 150 MG tablet Commonly known as:  ZANTAC Take 150 mg by mouth daily.   RESOURCE THICKENUP CLEAR Powd Take 1 g by mouth 4 (four) times daily -  with meals and at bedtime.   traZODone 50 MG tablet Commonly known as:  DESYREL Take 50 mg by mouth at bedtime.   VENTOLIN HFA 108 (90 Base) MCG/ACT inhaler Generic drug:  albuterol Inhale 2 puffs into the lungs every 6 (six) hours as needed for wheezing or shortness of breath.            Discharge Care Instructions        Start     Ordered   04/23/17 0000  digoxin (LANOXIN) 0.25 MG tablet  Daily     04/22/17 1148   04/22/17 0000  haloperidol (HALDOL) 1 MG tablet  Every 6 hours PRN     04/22/17 1148   04/22/17 0000  clindamycin (CLEOCIN) 300 MG capsule  3 times daily     04/22/17 1148   04/22/17 0000  Increase activity slowly     04/22/17 1148   04/21/17 0000  ezetimibe (ZETIA) 10 MG tablet  Daily     04/20/17 1241   04/21/17 0000  furosemide (LASIX) 20 MG tablet  Daily     04/20/17 1241   04/20/17 0000  diltiazem (CARDIZEM) 90 MG tablet  Every 8 hours     04/20/17 1241   04/20/17 0000  donepezil (ARICEPT) 5 MG tablet  Daily at bedtime     04/20/17 1241   04/20/17 0000  LORazepam (ATIVAN) 0.5 MG tablet  Every 8 hours     04/20/17 1241   04/20/17 0000  memantine (NAMENDA) 10 MG tablet  Daily     04/20/17 1241   04/20/17 0000  Maltodextrin-Xanthan Gum (RESOURCE THICKENUP CLEAR) POWD  3 times daily with meals & bedtime     04/20/17 1241   04/20/17 0000  Increase activity slowly     04/20/17 1241   04/20/17 0000  Diet - low sodium heart healthy     04/20/17 1241      Allergies  Allergen Reactions  . Bacitracin Other (See Comments)    Reaction unknown per [facility]  . Other Other (See Comments)    HMG-COA reductase inhibitors - reaction unknown per [facility]  .  Penicillins Other (See Comments)    Reaction unknown per  [facility]  . Statins Other (See Comments)    Reaction unknown per [facility]    Consultations:  Psych  Neurology    Procedures/Studies: Ct Angio Head W Or Wo Contrast  Result Date: 04/19/2017 CLINICAL DATA:  Acute left cerebellar infarct on MRI. History of dementia. EXAM: CT ANGIOGRAPHY HEAD TECHNIQUE: Multidetector CT imaging of the head was performed using the standard protocol during bolus administration of intravenous contrast. Multiplanar CT image reconstructions and MIPs were obtained to evaluate the vascular anatomy. CONTRAST:  50 mL Isovue 370 COMPARISON:  Brain MRI 04/16/2017.  No prior angiographic imaging. FINDINGS: CT HEAD Brain: 1.8 cm low-density focus posteriorly in the left cerebellum corresponds to the acute infarct demonstrated on recent MRI. There is no evidence of significant infarct extension or associated hemorrhage. No mass, midline shift, or extra-axial fluid collection is seen. There is moderate cerebral atrophy. Patchy cerebral white matter hypodensities are nonspecific but compatible with moderate chronic small vessel ischemic disease. A punctate calcification is noted in the left pons. Vascular: Calcified atherosclerosis at the skullbase. Skull: No fracture focal osseous lesion. Sinuses: Trace left maxillary sinus fluid.  Clear mastoid air cells. Orbits: Lateral cataract extraction. CTA HEAD Anterior circulation: The internal carotid arteries are patent from skullbase to carotid termini. There is moderate siphon atherosclerosis resulting in up to mild cavernous and supraclinoid stenosis bilaterally. ACAs and MCAs are patent with mild branch vessel irregularity but no evidence of proximal branch occlusion or significant proximal stenosis. No aneurysm. Posterior circulation: The visualized distal vertebral arteries are patent to the basilar and codominant. Left V4 segment atherosclerosis results in up to mild stenosis. There is milder right V4 segment atherosclerosis without significant  stenosis. Patent PICA and SCA origins are visualized bilaterally. AICAs are not identified. The basilar artery is widely patent. There is a patent right posterior communicating artery. The PCAs are patent with mild branch vessel irregularity bilaterally as well as a mild focal proximal P2 stenosis on the right. No aneurysm. Venous sinuses: Patent. Anatomic variants: None. Delayed phase: No abnormal enhancement. IMPRESSION: 1. Intracranial atherosclerosis without large vessel occlusion or flow limiting proximal stenosis. 2. Mild left V4, right P2, and bilateral ICA stenoses. 3. Small left cerebellar infarct, unchanged in size from the prior MRI. Electronically Signed   By: Sebastian Ache M.D.   On: 04/19/2017 15:04   Dg Chest 2 View  Result Date: 04/10/2017 CLINICAL DATA:  Progressive altered mental status for 2 months. EXAM: CHEST  2 VIEW COMPARISON:  Radiographs 06/22/2015 FINDINGS: Lung volumes. Mild cardiomegaly. Tortuous atherosclerotic thoracic aorta. Interstitial and bronchial thickening which is similar allowing for differences in technique. Patchy bibasilar opacities, right greater than left, progressed from prior exam. No pleural fluid. No pneumothorax. The bones are under mineralized. IMPRESSION: 1. Patchy bibasilar opacities, right greater than left, persistent or recurrent from 2016 exam. This may reflect atelectasis superimposed on scarring versus sequela of aspiration in the appropriate clinical setting. 2. Mild cardiomegaly and atherosclerosis of the thoracic aorta. 3. Chronic interstitial and bronchial thickening, grossly stable allowing for lower lung volumes. Electronically Signed   By: Rubye Oaks M.D.   On: 04/10/2017 18:08   Ct Head Wo Contrast  Result Date: 04/10/2017 CLINICAL DATA:  Altered mental status. EXAM: CT HEAD WITHOUT CONTRAST TECHNIQUE: Contiguous axial images were obtained from the base of the skull through the vertex without intravenous contrast. COMPARISON:  CT scan of  June 20, 2015. FINDINGS: Brain: Mild diffuse  cortical atrophy is noted. Mild chronic ischemic white matter disease is noted. No mass effect or midline shift is noted. Ventricular size is within normal limits. There is no evidence of mass lesion, hemorrhage or acute infarction. Vascular: No hyperdense vessel or unexpected calcification. Skull: Normal. Negative for fracture or focal lesion. Sinuses/Orbits: No acute finding. Other: None. IMPRESSION: Mild diffuse cortical atrophy. Mild chronic ischemic white matter disease. No acute intracranial abnormality seen. Electronically Signed   By: Lupita Raider, M.D.   On: 04/10/2017 18:00   Ct Chest W Contrast  Result Date: 04/17/2017 CLINICAL DATA:  Sepsis secondary to community acquired pneumonia in a patient with dementia. Acute kidney injury. EXAM: CT CHEST, ABDOMEN, AND PELVIS WITH CONTRAST TECHNIQUE: Multidetector CT imaging of the chest, abdomen and pelvis was performed following the standard protocol during bolus administration of intravenous contrast. CONTRAST:  100 ml ISOVUE-300 IOPAMIDOL (ISOVUE-300) INJECTION 61% COMPARISON:  PA and lateral chest 04/10/2017 and 06/22/2015. FINDINGS: CT CHEST FINDINGS Cardiovascular: There is cardiomegaly. No pericardial effusion. Extensive calcific aortic and coronary atherosclerosis is identified. The pulmonary outflow trunk and right and left main pulmonary arteries are prominent suggestive of pulmonary arterial hypertension. Mediastinum/Nodes: AP window node on image 18 measures 1.4 cm short axis dimension right precarinal node on image 20 measures 1.6 cm short axis dimension. Finally, right hilar node on image 25 measures 1.1 cm short axis dimension. Lungs/Pleura: Small bilateral pleural effusions are identified. Extensive airspace disease throughout all lobes of both lungs is identified and appears somewhat worst in the upper lobes bilaterally. Some areas of opacities in the upper lobes have a crazy paving type  appearance. Musculoskeletal: No acute abnormality. No lytic or sclerotic lesion. Multifocal spondylosis noted. CT ABDOMEN PELVIS FINDINGS Hepatobiliary: The liver is low attenuating consistent with fatty infiltration. No focal lesion. Gallbladder and biliary tree appear normal. Pancreas: Unremarkable. No pancreatic ductal dilatation or surrounding inflammatory changes. Spleen: Normal in size without focal abnormality. Adrenals/Urinary Tract: Small cyst in the lower pole of the left kidney is noted. The kidneys otherwise appear normal. 1 cm nodule in the left adrenal gland cannot be definitively characterized but is likely a benign adenoma. The right adrenal gland appears normal. Urinary bladder is unremarkable. Stomach/Bowel: Small hiatal hernia is seen. The stomach is otherwise unremarkable. Small and large bowel appear normal. The appendix is not visualized but there is no evidence of appendicitis. Vascular/Lymphatic: Extensive aortoiliac atherosclerosis without aneurysm. No lymphadenopathy. Reproductive: Status post hysterectomy. No adnexal masses. Other: No fluid collection.  Mild body wall edema is noted. Musculoskeletal: No acute or focal bony abnormality. Multilevel lumbar spondylosis is noted. IMPRESSION: Extensive airspace disease throughout the chest could be due to multifocal pneumonia, ARDS or pulmonary edema. Marked cardiomegaly. Small mediastinal lymph nodes are presumably reactive. Small bilateral pleural effusions. Prominence of the pulmonary arteries compatible with pulmonary arterial hypertension. Extensive calcific aortic and coronary atherosclerosis. No acute abnormality abdomen or pelvis. Fatty infiltration of the liver. Electronically Signed   By: Drusilla Kanner M.D.   On: 04/17/2017 00:57   Mr Laqueta Jean ZO Contrast  Result Date: 04/16/2017 CLINICAL DATA:  Initial evaluation for a advanced dementia with increasing confusion and behavioral disturbance. EXAM: MRI HEAD WITHOUT AND WITH  CONTRAST TECHNIQUE: Multiplanar, multiecho pulse sequences of the brain and surrounding structures were obtained without and with intravenous contrast. CONTRAST:  12mL MULTIHANCE GADOBENATE DIMEGLUMINE 529 MG/ML IV SOLN COMPARISON:  Comparison made with prior CT from 04/10/2017. FINDINGS: Brain: Study moderately degraded by motion artifact. Diffuse prominence of  the CSF containing spaces compatible with generalized cerebral atrophy. Patchy and confluent T2/FLAIR hyperintensity within the periventricular and deep white matter both cerebral hemispheres most consistent with chronic micro vessel ischemic disease. Overall, changes are moderate nature. Patchy approximately 2 cm acute ischemic infarct within the mid left cerebellar hemisphere (series 4, image 15). No associated mass effect or hemorrhage. No other areas of acute or subacute infarction. Gray-white matter differentiation otherwise maintained. No other encephalomalacia to suggest chronic infarction. No evidence for acute or chronic intracranial hemorrhage. No mass lesion, midline shift or mass effect. Mild ventricular prominence related to global parenchymal volume loss without hydrocephalus. No extra-axial fluid collection. Major dural sinuses are grossly patent. No definite abnormal enhancement, although postcontrast sequence Ing is limited by extensive motion. Pituitary gland suprasellar region grossly within normal limits. Vascular: Major intracranial vascular flow voids are maintained. Skull and upper cervical spine: Craniocervical junction within normal limits. Visualized upper cervical spine unremarkable. Bone marrow signal intensity within normal limits. No scalp soft tissue abnormality. Sinuses/Orbits: Globes and orbital soft tissues within normal limits. Patient status post lens extraction bilaterally. Scattered mucosal thickening within the left ethmoidal air cells and left maxillary sinus. Superimposed fluid level noted within left maxillary sinus.  Paranasal sinuses otherwise clear. Trace opacity noted within the right mastoid air cells. Inner ear structures grossly normal. Other: None. IMPRESSION: 1. 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 2. Moderate cerebral atrophy with chronic microvascular ischemic disease. 3. Acute left maxillary and ethmoidal sinusitis. Electronically Signed   By: Rise Mu M.D.   On: 04/16/2017 19:21   Ct Abdomen Pelvis W Contrast  Result Date: 04/17/2017 CLINICAL DATA:  Sepsis secondary to community acquired pneumonia in a patient with dementia. Acute kidney injury. EXAM: CT CHEST, ABDOMEN, AND PELVIS WITH CONTRAST TECHNIQUE: Multidetector CT imaging of the chest, abdomen and pelvis was performed following the standard protocol during bolus administration of intravenous contrast. CONTRAST:  100 ml ISOVUE-300 IOPAMIDOL (ISOVUE-300) INJECTION 61% COMPARISON:  PA and lateral chest 04/10/2017 and 06/22/2015. FINDINGS: CT CHEST FINDINGS Cardiovascular: There is cardiomegaly. No pericardial effusion. Extensive calcific aortic and coronary atherosclerosis is identified. The pulmonary outflow trunk and right and left main pulmonary arteries are prominent suggestive of pulmonary arterial hypertension. Mediastinum/Nodes: AP window node on image 18 measures 1.4 cm short axis dimension right precarinal node on image 20 measures 1.6 cm short axis dimension. Finally, right hilar node on image 25 measures 1.1 cm short axis dimension. Lungs/Pleura: Small bilateral pleural effusions are identified. Extensive airspace disease throughout all lobes of both lungs is identified and appears somewhat worst in the upper lobes bilaterally. Some areas of opacities in the upper lobes have a crazy paving type appearance. Musculoskeletal: No acute abnormality. No lytic or sclerotic lesion. Multifocal spondylosis noted. CT ABDOMEN PELVIS FINDINGS Hepatobiliary: The liver is low attenuating consistent with fatty  infiltration. No focal lesion. Gallbladder and biliary tree appear normal. Pancreas: Unremarkable. No pancreatic ductal dilatation or surrounding inflammatory changes. Spleen: Normal in size without focal abnormality. Adrenals/Urinary Tract: Small cyst in the lower pole of the left kidney is noted. The kidneys otherwise appear normal. 1 cm nodule in the left adrenal gland cannot be definitively characterized but is likely a benign adenoma. The right adrenal gland appears normal. Urinary bladder is unremarkable. Stomach/Bowel: Small hiatal hernia is seen. The stomach is otherwise unremarkable. Small and large bowel appear normal. The appendix is not visualized but there is no evidence of appendicitis. Vascular/Lymphatic: Extensive aortoiliac atherosclerosis without aneurysm. No lymphadenopathy.  Reproductive: Status post hysterectomy. No adnexal masses. Other: No fluid collection.  Mild body wall edema is noted. Musculoskeletal: No acute or focal bony abnormality. Multilevel lumbar spondylosis is noted. IMPRESSION: Extensive airspace disease throughout the chest could be due to multifocal pneumonia, ARDS or pulmonary edema. Marked cardiomegaly. Small mediastinal lymph nodes are presumably reactive. Small bilateral pleural effusions. Prominence of the pulmonary arteries compatible with pulmonary arterial hypertension. Extensive calcific aortic and coronary atherosclerosis. No acute abnormality abdomen or pelvis. Fatty infiltration of the liver. Electronically Signed   By: Drusilla Kanner M.D.   On: 04/17/2017 00:57      Subjective: Alert, answer yes to questions.   Discharge Exam: Vitals:   04/22/17 0618 04/22/17 1112  BP: 129/79   Pulse: 68 72  Resp: 18   Temp: 98.1 F (36.7 C)   SpO2: 91%    Vitals:   04/21/17 2007 04/21/17 2210 04/22/17 0618 04/22/17 1112  BP: (!) 200/102 (!) 152/70 129/79   Pulse: 92  68 72  Resp: 20  18   Temp: 98.1 F (36.7 C)  98.1 F (36.7 C)   TempSrc: Axillary   Axillary   SpO2: 92%  91%   Weight:      Height:        General: Pt is alert, awake, not in acute distress Cardiovascular: RRR, S1/S2 +, no rubs, no gallops Respiratory: CTA bilaterally, no wheezing, no rhonchi Abdominal: Soft, NT, ND, bowel sounds + Extremities: no edema, no cyanosis    The results of significant diagnostics from this hospitalization (including imaging, microbiology, ancillary and laboratory) are listed below for reference.     Microbiology: Recent Results (from the past 240 hour(s))  Culture, blood (routine x 2)     Status: None (Preliminary result)   Collection Time: 04/17/17 10:46 AM  Result Value Ref Range Status   Specimen Description BLOOD RIGHT ARM  Final   Special Requests   Final    BOTTLES DRAWN AEROBIC ONLY Blood Culture results may not be optimal due to an inadequate volume of blood received in culture bottles   Culture   Final    NO GROWTH 4 DAYS Performed at [facility], [address], [place], [place] [address]    Report Status PENDING  Incomplete  Culture, blood (routine x 2)     Status: None (Preliminary result)   Collection Time: 04/17/17 11:01 AM  Result Value Ref Range Status   Specimen Description BLOOD RIGHT ANTECUBITAL  Final   Special Requests IN PEDIATRIC BOTTLE Blood Culture adequate volume  Final   Culture   Final    NO GROWTH 4 DAYS Performed at [facility], [address] [place], [place] [address]    Report Status PENDING  Incomplete     Labs: BNP (last 3 results)  Recent Labs  04/17/17 1103  BNP 442.4*   Basic Metabolic Panel:  Recent Labs Lab 04/17/17 0600 04/19/17 0539  NA 136 141  K 3.6 3.6  CL 102 109  CO2 23 24  GLUCOSE 94 97  BUN 9 16  CREATININE 0.56 0.79  CALCIUM 8.8* 8.6*   Liver Function Tests: No results for input(s): AST, ALT, ALKPHOS, BILITOT, PROT, ALBUMIN in the last 168 hours. No results for input(s): LIPASE, AMYLASE in the last 168 hours. No results for input(s):  AMMONIA in the last 168 hours. CBC:  Recent Labs Lab 04/17/17 0600  WBC 8.8  HGB 14.2  HCT 42.3  MCV 91.6  PLT  150   Cardiac Enzymes: No results for input(s): CKTOTAL, CKMB, CKMBINDEX, TROPONINI in the last 168 hours. BNP: Invalid input(s): POCBNP CBG: No results for input(s): GLUCAP in the last 168 hours. D-Dimer No results for input(s): DDIMER in the last 72 hours. Hgb A1c No results for input(s): HGBA1C in the last 72 hours. Lipid Profile No results for input(s): CHOL, HDL, LDLCALC, TRIG, CHOLHDL, LDLDIRECT in the last 72 hours. Thyroid function studies No results for input(s): TSH, T4TOTAL, T3FREE, THYROIDAB in the last 72 hours.  Invalid input(s): FREET3 Anemia work up No results for input(s): VITAMINB12, FOLATE, FERRITIN, TIBC, IRON, RETICCTPCT in the last 72 hours. Urinalysis    Component Value Date/Time   COLORURINE YELLOW 04/10/2017 1858   APPEARANCEUR CLEAR 04/10/2017 1858   LABSPEC 1.019 04/10/2017 1858   PHURINE 6.0 04/10/2017 1858   GLUCOSEU NEGATIVE 04/10/2017 1858   HGBUR NEGATIVE 04/10/2017 1858   BILIRUBINUR NEGATIVE 04/10/2017 1858   KETONESUR 5 (A) 04/10/2017 1858   PROTEINUR NEGATIVE 04/10/2017 1858   UROBILINOGEN 2.0 (H) 06/10/2015 1724   NITRITE NEGATIVE 04/10/2017 1858   LEUKOCYTESUR TRACE (A) 04/10/2017 1858   Sepsis Labs Invalid input(s): PROCALCITONIN,  WBC,  LACTICIDVEN Microbiology Recent Results (from the past 240 hour(s))  Culture, blood (routine x 2)     Status: None (Preliminary result)   Collection Time: 04/17/17 10:46 AM  Result Value Ref Range Status   Specimen Description BLOOD RIGHT ARM  Final   Special Requests   Final    BOTTLES DRAWN AEROBIC ONLY Blood Culture results may not be optimal due to an inadequate volume of blood received in culture bottles   Culture   Final    NO GROWTH 4 DAYS Performed at [facility], [address] [place], [place] [address]    Report Status PENDING  Incomplete  Culture, blood  (routine x 2)     Status: None (Preliminary result)   Collection Time: 04/17/17 11:01 AM  Result Value Ref Range Status   Specimen Description BLOOD RIGHT ANTECUBITAL  Final   Special Requests IN PEDIATRIC BOTTLE Blood Culture adequate volume  Final   Culture   Final    NO GROWTH 4 DAYS Performed at [facility], [address], [place], [place] [address]    Report Status PENDING  Incomplete     Time coordinating discharge: Over 30 minutes  SIGNED:   Alba Cory, MD  [facility] 04/22/2017, 11:52 AM Pager   If 7PM-7AM, please contact night-coverage [email or web address] Password [email or web address]

## 2017-04-22 NOTE — Care Management Note (Signed)
Case Management Note  Patient Details  Name: Kaicee Scarpino MRN: [id number] Date of Birth: Nov 17, 1945  Subjective/Objective:                    Action/Plan:d/c SNF.   Expected Discharge Date:  04/22/17               Expected Discharge Plan:  Skilled Nursing Facility  In-House Referral:  Clinical Social Work  Discharge planning Services  CM Consult  Post Acute Care Choice:  NA Choice offered to:  NA  DME Arranged:  N/A DME Agency:  NA  HH Arranged:  NA HH Agency:  NA  Status of Service:  Completed, signed off  If discussed at [employer] of Stay Meetings, dates discussed:    Additional Comments:  Lanier Clam, RN 04/22/2017, 11:55 AM

## 2017-04-22 NOTE — Progress Notes (Signed)
PHARMACIST - PHYSICIAN COMMUNICATION  DR:   Sunnie Nielsen CONCERNING: IV to Oral Route Change Policy  RECOMMENDATION: This patient is receiving digoxin by the intravenous route.  Based on criteria approved by the Pharmacy and Therapeutics Committee, the intravenous medication(s) is/are being converted to the equivalent oral dose form(s).   DESCRIPTION: These criteria include:  The patient is eating (either orally or via tube) and/or has been taking other orally administered medications for a least 24 hours  The patient has no evidence of active gastrointestinal bleeding or impaired GI absorption (gastrectomy, short bowel, patient on TNA or NPO).  If you have questions about this conversion, please contact the Pharmacy Department    [phone] )  [facility]   [phone] )  [facility]   [phone] )  [facility]   [phone] )  [facility]   [phone] )  [facility]   Len Childs T, [facility] 04/22/2017 8:22 AM

## 2017-04-22 NOTE — Progress Notes (Signed)
CSW contacted by staff member Zella Ball from [facility]. Staff reported that they contacted patient's current ALF and was informed that patient needs a memory care unit. Staff reported that they would not be able to offer patient a bed because they do not have a memory care unit.   CSW provided update to patient's brother. Patient's brother requested that CSW contact these facilities to inquire about ability to offer patient a bed: [facility] and [facility].   CSW contacted [facility] and spoke with staff member Arline Asp about patient's referral. Staff reported that they do not have a memory care unit and that they don't have beds available.   CSW contacted [facility] SNF. Staff confirmed that they do have a memory care unit and transferred CSW to admissions. CSW left voicemail for admissions staff, awaiting return call.   CSW provided update to patient's brother. Patient's brother reported that he is agreeable to [facility] SN[facility], noting he wants to get things confirmed. cSW agreed to call back with an update.   CSW contacted [facility] SN[facility] and spoke with staff member Rayfield Citizen who confirmed bed offer for patient.   CSW updated patient's MD.   CSW contacted patient's brother and provided with update. Patient's brother confirmed plan for patient to dc to [facility] SN[facility].  CSW will continue to follow and assist with dc planning.   Celso Sickle, [place] Clinical Social Worker [facility] Cell#: [phone]

## 2017-04-22 NOTE — Plan of Care (Signed)
Problem: Education: Goal: Knowledge of secondary prevention will improve Outcome: Completed/Met Date Met: 04/22/17 Discussed with family, HCPOA

## 2017-04-22 NOTE — Progress Notes (Signed)
CSW contacted patient's brother and provided update about bed offer. CSW informed patient's brother that she had one bed offer at [facility]. Patient's brother reported that he would look up the facility.  CSW contacted by patient's brother and informed that he looked up [facility] SNF and that it was too far from his home. Patient's brother informed CSW that [facility] SNF had beds available and requested that CSW contact them about patient's referral. CSW informed patient's brother that they did not make an offer and agreed to follow up.   CSW contacted [facility] and spoke with staff member Clydie Braun about patient's referral, phone was disconnected. CSW attempted to call staff back, no answer. CSW was contacted by [facility] staff member Olegario Messier and informed that they are not able to offer patient a bed.  CSW contacted patient's brother and provided update. Patient's brother requested that CSW contact the following SNFs to inquire about patient's referral: [facility] and [facility] and [address], CSW agreed.  CSW contacted [facility] Re[facility] and spoke with staff member Dawn, who reported they were at capacity. CSW contacted [facility] and Rehab and spoke with staff member Wilkie Aye who agreed to review patient's referral and get back to CSW.  CSW awaiting return call from [facility] SNF and will provide update to patient's brother.   Celso Sickle, [place] Clinical Social Worker [facility] Cell#: [phone]

## 2017-04-22 NOTE — Clinical Social Work Placement (Signed)
Patient received and accepted bed offer at [facility] Facility aware of patient's discharge and confirmed patient's bed offer, staff requested transportation be arranged for 3. [facility] contacted, patient's family notified. Patient's RN can call report to [phone], packet complete. CSW signing off, no other needs identified at this time.  CLINICAL SOCIAL WORK PLACEMENT  NOTE  Date:  04/22/2017  Patient Details  Name: Sheryl Horn MRN: [id number] Date of Birth: 1946-06-22  Clinical Social Work is seeking post-discharge placement for this patient at the Skilled  Nursing Facility level of care (*CSW will initial, date and re-position this form in  chart as items are completed):  Yes   Patient/family provided with [facility] Clinical Social Work Department's list of facilities offering this level of care within the geographic area requested by the patient (or if unable, by the patient's family).  Yes   Patient/family informed of their freedom to choose among providers that offer the needed level of care, that participate in Medicare, Medicaid or managed care program needed by the patient, have an available bed and are willing to accept the patient.  Yes   Patient/family informed of [facility]'s ownership interest in [facility] and [facility], as well as of the fact that they are under no obligation to receive care at these facilities.  PASRR submitted to EDS on 04/18/17     PASRR number received on 04/21/17     Existing PASRR number confirmed on       FL2 transmitted to all facilities in geographic area requested by pt/family on 04/18/17     FL2 transmitted to all facilities within larger geographic area on       Patient informed that his/her managed care company has contracts with or will negotiate with certain facilities, including the following:        Yes   Patient/family informed of bed offers received.  Patient chooses bed at [facility]      Physician recommends and patient chooses bed at      Patient to be transferred to [facility] on 04/22/17.  Patient to be transferred to facility by [facility]     Patient family notified on 04/22/17 of transfer.  Name of family member notified:  Betti Cruz     PHYSICIAN       Additional Comment:    _______________________________________________ Antionette Poles, LCSW 04/22/2017, 12:52 PM

## 2017-04-22 NOTE — Care Management Important Message (Signed)
Important Message  Patient Details  Name: Sheryl Horn MRN: [id number] Date of Birth: 06-Jan-1946   Medicare Important Message Given:  Yes    Caren Macadam 04/22/2017, 9:36 AMImportant Message  Patient Details  Name: Sheryl Horn MRN: [id number] Date of Birth: 1946-06-17   Medicare Important Message Given:  Yes    Caren Macadam 04/22/2017, 9:36 AM

## 2017-04-22 NOTE — Plan of Care (Signed)
Problem: Education: Goal: Knowledge of secondary prevention will improve Outcome: Progressing Pt has alzheimers, with limited understanding of hand out.  Discussed with brother HCPOA

## 2019-03-02 IMAGING — CT CT CHEST W/ CM
2 of 5 series · 13 of 36 positions shown, 16 images · IV contrast (APPLIED)
Comparison: PA and lateral chest 04/10/2017 and 06/22/2015.

CLINICAL DATA: Sepsis secondary to community acquired pneumonia in
a patient with dementia. Acute kidney injury.

EXAM:
CT CHEST, ABDOMEN, AND PELVIS WITH CONTRAST
TECHNIQUE: Multidetector CT imaging of the chest, abdomen and pelvis was
performed following the standard protocol during bolus
administration of intravenous contrast.
CONTRAST:  100 ml [id number] IOPAMIDOL ([id number]) INJECTION 61%

[Series 2: cap with · axial · 0.73mm/px · z∈[-693,-228]mm · 10 of 115 slices shown, 13 images]
[im 11/115  mediastinal]
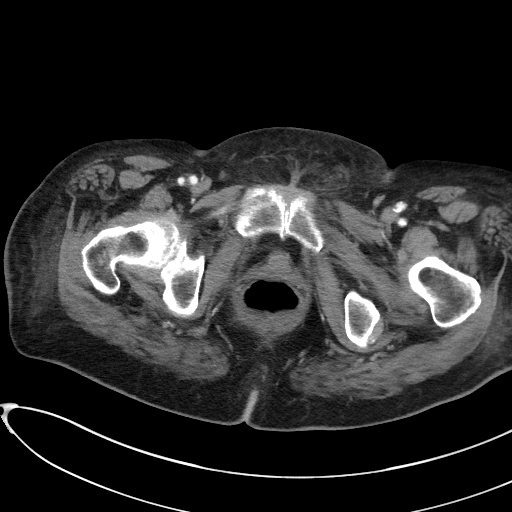
[im 11/115  lung]
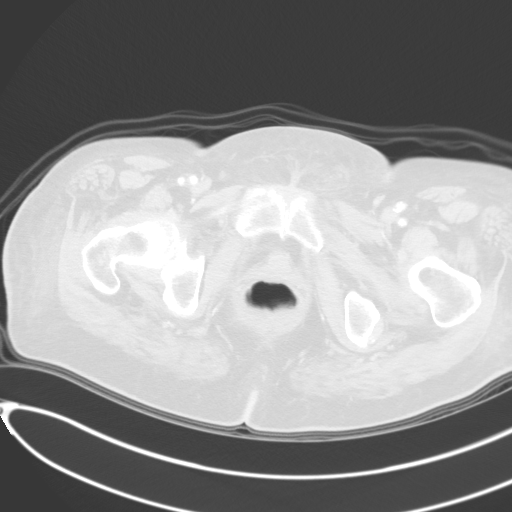
[im 21/115  lung]
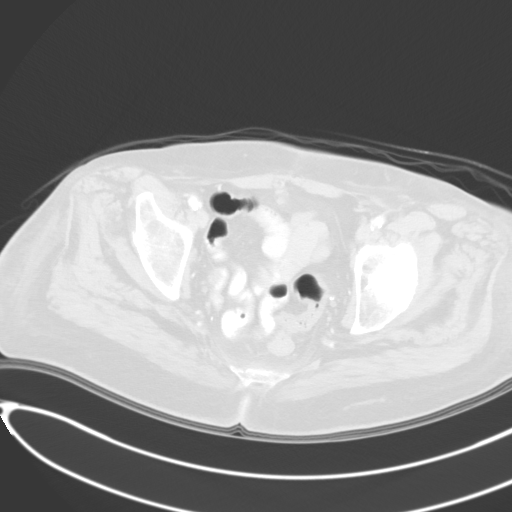
[im 32/115  lung]
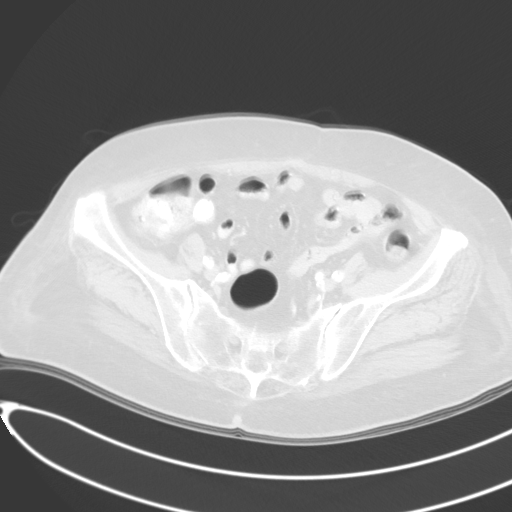
[im 42/115  lung]
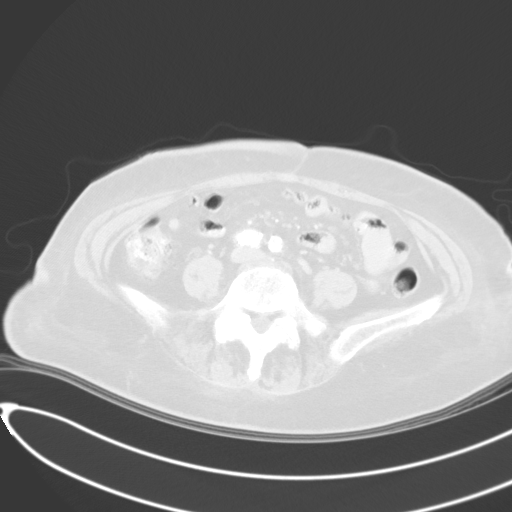
[im 52/115  mediastinal]
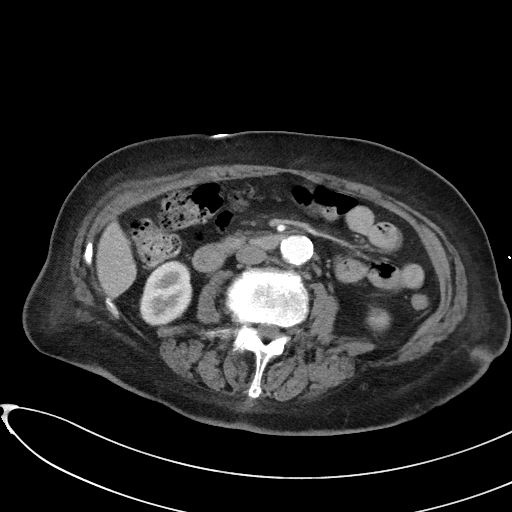
[im 52/115  lung]
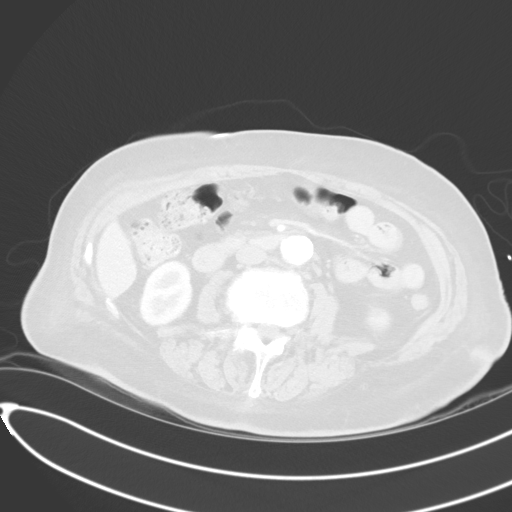
[im 63/115  lung]
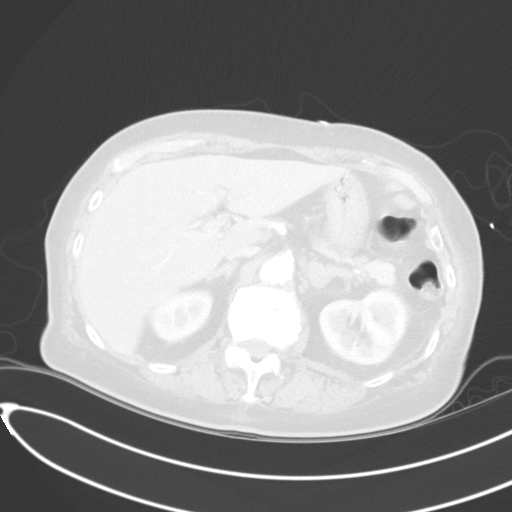
[im 73/115  lung]
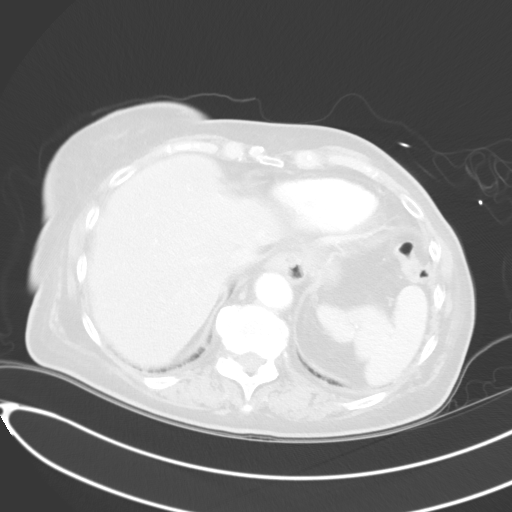
[im 83/115  lung]
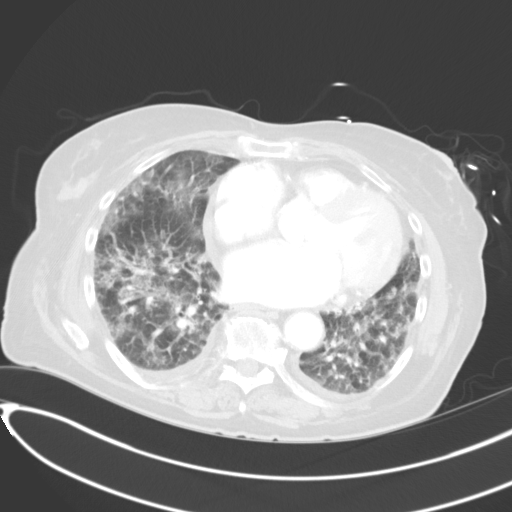
[im 94/115  mediastinal]
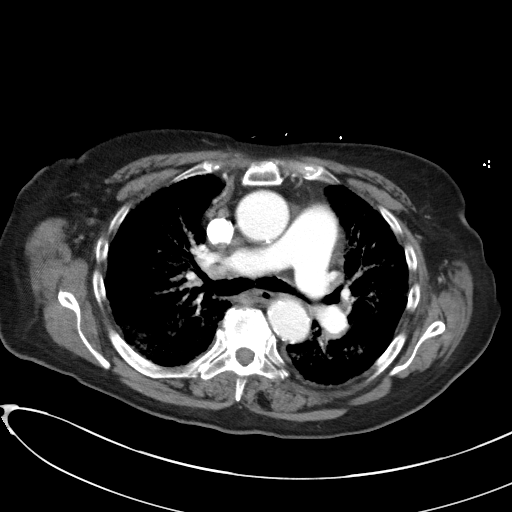
[im 94/115  lung]
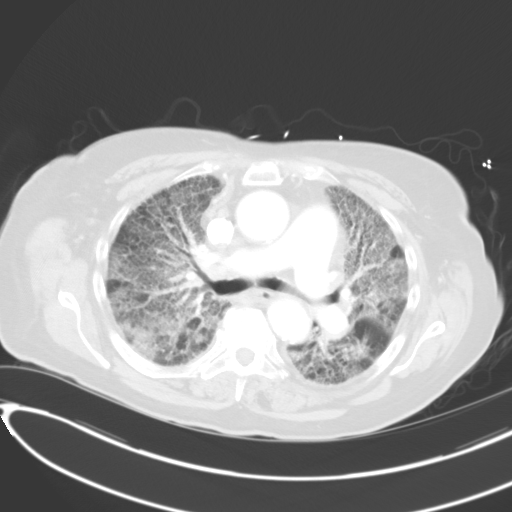
[im 104/115  lung]
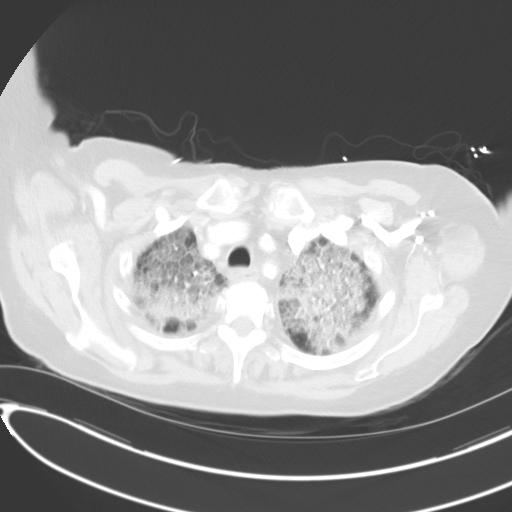

[Series 5: coronals · coronal · 0.69mm/px · 3 of 120 slices shown]
[im 24/120  lung]
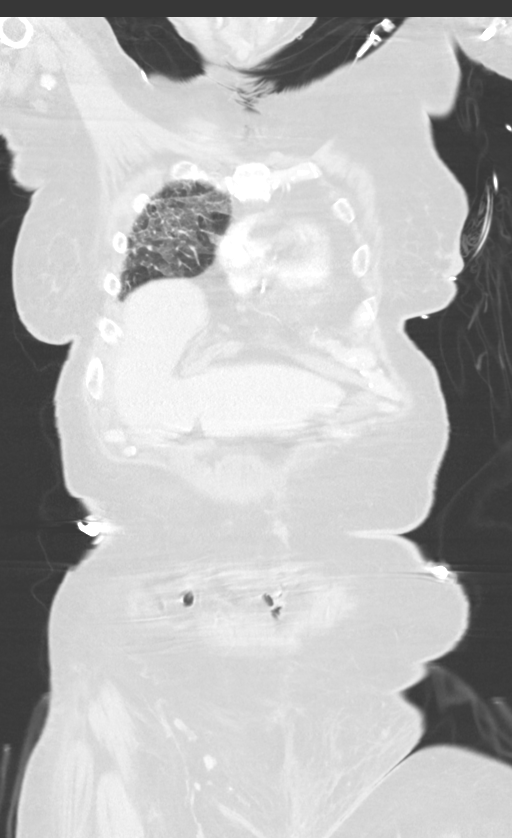
[im 48/120  lung]
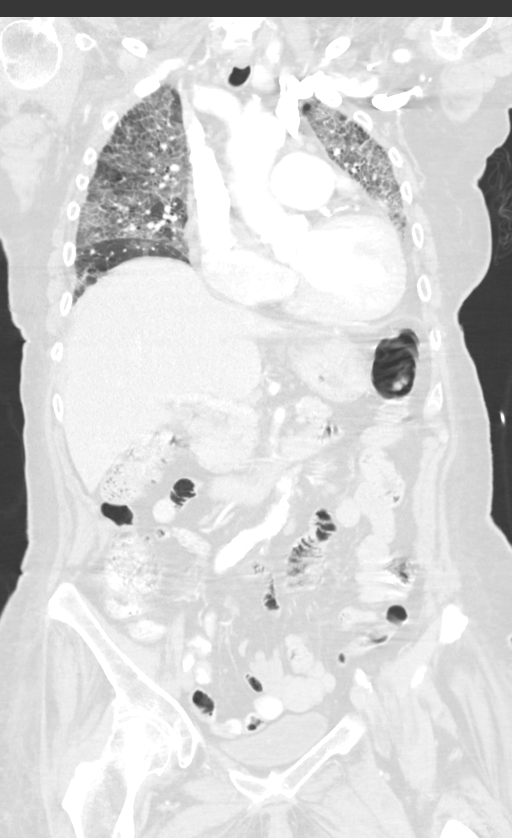
[im 72/120  lung]
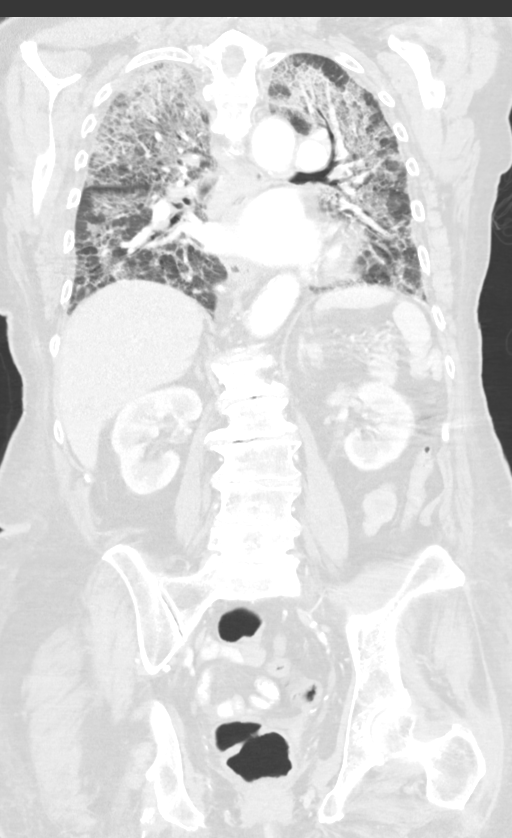

[13 of 36 positions shown; findings below may reference images not displayed]

FINDINGS: CT CHEST FINDINGS

Cardiovascular: There is cardiomegaly. No pericardial effusion.
Extensive calcific aortic and coronary atherosclerosis is
identified. The pulmonary outflow trunk and right and left main
pulmonary arteries are prominent suggestive of pulmonary arterial
hypertension.

Mediastinum/Nodes: AP window node on image 18 measures 1.4 cm short
axis dimension right precarinal node on image 20 measures 1.6 cm
short axis dimension. Finally, right hilar node on image 25 measures
1.1 cm short axis dimension.

Lungs/Pleura: Small bilateral pleural effusions are identified.
Extensive airspace disease throughout all lobes of both lungs is
identified and appears somewhat worst in the upper lobes
bilaterally. Some areas of opacities in the upper lobes have a crazy
paving type appearance.

Musculoskeletal: No acute abnormality. No lytic or sclerotic lesion.
Multifocal spondylosis noted.

CT ABDOMEN PELVIS FINDINGS

Hepatobiliary: The liver is low attenuating consistent with fatty
infiltration. No focal lesion. Gallbladder and biliary tree appear
normal.

Pancreas: Unremarkable. No pancreatic ductal dilatation or
surrounding inflammatory changes.

Spleen: Normal in size without focal abnormality.

Adrenals/Urinary Tract: Small cyst in the lower pole of the left
kidney is noted. The kidneys otherwise appear normal. 1 cm nodule in
the left adrenal gland cannot be definitively characterized but is
likely a benign adenoma. The right adrenal gland appears normal.
Urinary bladder is unremarkable.

Stomach/Bowel: Small hiatal hernia is seen. The stomach is otherwise
unremarkable. Small and large bowel appear normal. The appendix is
not visualized but there is no evidence of appendicitis.

Vascular/Lymphatic: Extensive aortoiliac atherosclerosis without
aneurysm. No lymphadenopathy.

Reproductive: Status post hysterectomy. No adnexal masses.

Other: No fluid collection.  Mild body wall edema is noted.

Musculoskeletal: No acute or focal bony abnormality. Multilevel
lumbar spondylosis is noted.
IMPRESSION: Extensive airspace disease throughout the chest could be due to
multifocal pneumonia, ARDS or pulmonary edema.

Marked cardiomegaly.

Small mediastinal lymph nodes are presumably reactive.

Small bilateral pleural effusions.

Prominence of the pulmonary arteries compatible with pulmonary
arterial hypertension.

Extensive calcific aortic and coronary atherosclerosis.

No acute abnormality abdomen or pelvis.

Fatty infiltration of the liver.

## 2022-05-06 DEATH — deceased
